# Patient Record
Sex: Female | Born: 1985 | Race: Black or African American | Hispanic: No | Marital: Single | Smoking: Never smoker
Health system: Southern US, Community
[De-identification: ages and names within clinical notes are randomized; demographics above are authoritative.]

## PROBLEM LIST (undated history)

## (undated) DIAGNOSIS — D8940 Mast cell activation, unspecified: Secondary | ICD-10-CM

## (undated) DIAGNOSIS — L509 Urticaria, unspecified: Secondary | ICD-10-CM

## (undated) DIAGNOSIS — Q796 Ehlers-Danlos syndrome, unspecified: Secondary | ICD-10-CM

## (undated) DIAGNOSIS — G90A Postural orthostatic tachycardia syndrome (POTS): Secondary | ICD-10-CM

## (undated) DIAGNOSIS — T7840XA Allergy, unspecified, initial encounter: Secondary | ICD-10-CM

## (undated) DIAGNOSIS — J45909 Unspecified asthma, uncomplicated: Secondary | ICD-10-CM

## (undated) HISTORY — DX: Urticaria, unspecified: L50.9

## (undated) HISTORY — DX: Allergy, unspecified, initial encounter: T78.40XA

## (undated) HISTORY — DX: Unspecified asthma, uncomplicated: J45.909

## (undated) HISTORY — PX: NO PAST SURGERIES: SHX2092

---
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
## 2007-03-28 ENCOUNTER — Other Ambulatory Visit: Admission: RE | Admit: 2007-03-28 | Discharge: 2007-03-28 | Payer: Self-pay | Admitting: Gynecology

## 2007-09-04 ENCOUNTER — Other Ambulatory Visit: Admission: RE | Admit: 2007-09-04 | Discharge: 2007-09-04 | Payer: Self-pay | Admitting: Gynecology

## 2011-03-15 DIAGNOSIS — Z91013 Allergy to seafood: Secondary | ICD-10-CM | POA: Insufficient documentation

## 2011-03-15 DIAGNOSIS — J45909 Unspecified asthma, uncomplicated: Secondary | ICD-10-CM | POA: Insufficient documentation

## 2011-03-15 DIAGNOSIS — R5381 Other malaise: Secondary | ICD-10-CM | POA: Insufficient documentation

## 2011-03-15 DIAGNOSIS — L500 Allergic urticaria: Secondary | ICD-10-CM | POA: Insufficient documentation

## 2011-03-15 HISTORY — DX: Allergy to seafood: Z91.013

## 2013-12-02 ENCOUNTER — Ambulatory Visit (INDEPENDENT_AMBULATORY_CARE_PROVIDER_SITE_OTHER): Payer: BC Managed Care – PPO | Admitting: Emergency Medicine

## 2013-12-02 VITALS — BP 118/76 | HR 68 | Temp 98.2°F | Resp 16 | Ht 71.0 in | Wt <= 1120 oz

## 2013-12-02 DIAGNOSIS — R5383 Other fatigue: Secondary | ICD-10-CM

## 2013-12-02 LAB — POCT CBC
Granulocyte percent: 59.7 %G (ref 37–80)
HCT, POC: 40.5 % (ref 37.7–47.9)
Hemoglobin: 13.1 g/dL (ref 12.2–16.2)
Lymph, poc: 1.9 (ref 0.6–3.4)
MCH, POC: 30.3 pg (ref 27–31.2)
MCHC: 32.4 g/dL (ref 31.8–35.4)
MCV: 93.3 fL (ref 80–97)
MID (cbc): 0.3 (ref 0–0.9)
MPV: 7.8 fL (ref 0–99.8)
POC GRANULOCYTE: 3.2 (ref 2–6.9)
POC LYMPH PERCENT: 34.4 %L (ref 10–50)
POC MID %: 5.9 % (ref 0–12)
Platelet Count, POC: 180 10*3/uL (ref 142–424)
RBC: 4.34 M/uL (ref 4.04–5.48)
RDW, POC: 14 %
WBC: 5.4 10*3/uL (ref 4.6–10.2)

## 2013-12-02 LAB — POCT UA - MICROSCOPIC ONLY
BACTERIA, U MICROSCOPIC: NEGATIVE
Casts, Ur, LPF, POC: NEGATIVE
Crystals, Ur, HPF, POC: NEGATIVE
MUCUS UA: NEGATIVE
WBC, UR, HPF, POC: NEGATIVE
Yeast, UA: NEGATIVE

## 2013-12-02 LAB — POCT URINALYSIS DIPSTICK
BILIRUBIN UA: NEGATIVE
GLUCOSE UA: NEGATIVE
Ketones, UA: NEGATIVE
Leukocytes, UA: NEGATIVE
Nitrite, UA: NEGATIVE
Protein, UA: NEGATIVE
SPEC GRAV UA: 1.01
Urobilinogen, UA: 0.2
pH, UA: 7

## 2013-12-02 LAB — COMPREHENSIVE METABOLIC PANEL
ALBUMIN: 4.3 g/dL (ref 3.5–5.2)
ALT: 10 U/L (ref 0–35)
AST: 20 U/L (ref 0–37)
Alkaline Phosphatase: 51 U/L (ref 39–117)
BUN: 11 mg/dL (ref 6–23)
CO2: 27 meq/L (ref 19–32)
Calcium: 9.5 mg/dL (ref 8.4–10.5)
Chloride: 103 mEq/L (ref 96–112)
Creat: 0.73 mg/dL (ref 0.50–1.10)
GLUCOSE: 80 mg/dL (ref 70–99)
POTASSIUM: 3.9 meq/L (ref 3.5–5.3)
SODIUM: 138 meq/L (ref 135–145)
Total Bilirubin: 0.7 mg/dL (ref 0.2–1.2)
Total Protein: 7.6 g/dL (ref 6.0–8.3)

## 2013-12-02 LAB — TSH: TSH: 1.117 u[IU]/mL (ref 0.350–4.500)

## 2013-12-02 LAB — POCT SEDIMENTATION RATE: POCT SED RATE: 58 mm/h — AB (ref 0–22)

## 2013-12-02 NOTE — Patient Instructions (Signed)
Fatigue Fatigue is a feeling of tiredness, lack of energy, lack of motivation, or feeling tired all the time. Having enough rest, good nutrition, and reducing stress will normally reduce fatigue. Consult your caregiver if it persists. The nature of your fatigue will help your caregiver to find out its cause. The treatment is based on the cause.  CAUSES  There are many causes for fatigue. Most of the time, fatigue can be traced to one or more of your habits or routines. Most causes fit into one or more of three general areas. They are: Lifestyle problems  Sleep disturbances.  Overwork.  Physical exertion.  Unhealthy habits.  Poor eating habits or eating disorders.  Alcohol and/or drug use .  Lack of proper nutrition (malnutrition). Psychological problems  Stress and/or anxiety problems.  Depression.  Grief.  Boredom. Medical Problems or Conditions  Anemia.  Pregnancy.  Thyroid gland problems.  Recovery from major surgery.  Continuous pain.  Emphysema or asthma that is not well controlled  Allergic conditions.  Diabetes.  Infections (such as mononucleosis).  Obesity.  Sleep disorders, such as sleep apnea.  Heart failure or other heart-related problems.  Cancer.  Kidney disease.  Liver disease.  Effects of certain medicines such as antihistamines, cough and cold remedies, prescription pain medicines, heart and blood pressure medicines, drugs used for treatment of cancer, and some antidepressants. SYMPTOMS  The symptoms of fatigue include:   Lack of energy.  Lack of drive (motivation).  Drowsiness.  Feeling of indifference to the surroundings. DIAGNOSIS  The details of how you feel help guide your caregiver in finding out what is causing the fatigue. You will be asked about your present and past health condition. It is important to review all medicines that you take, including prescription and non-prescription items. A thorough exam will be done.  You will be questioned about your feelings, habits, and normal lifestyle. Your caregiver may suggest blood tests, urine tests, or other tests to look for common medical causes of fatigue.  TREATMENT  Fatigue is treated by correcting the underlying cause. For example, if you have continuous pain or depression, treating these causes will improve how you feel. Similarly, adjusting the dose of certain medicines will help in reducing fatigue.  HOME CARE INSTRUCTIONS   Try to get the required amount of good sleep every night.  Eat a healthy and nutritious diet, and drink enough water throughout the day.  Practice ways of relaxing (including yoga or meditation).  Exercise regularly.  Make plans to change situations that cause stress. Act on those plans so that stresses decrease over time. Keep your work and personal routine reasonable.  Avoid street drugs and minimize use of alcohol.  Start taking a daily multivitamin after consulting your caregiver. SEEK MEDICAL CARE IF:   You have persistent tiredness, which cannot be accounted for.  You have fever.  You have unintentional weight loss.  You have headaches.  You have disturbed sleep throughout the night.  You are feeling sad.  You have constipation.  You have dry skin.  You have gained weight.  You are taking any new or different medicines that you suspect are causing fatigue.  You are unable to sleep at night.  You develop any unusual swelling of your legs or other parts of your body. SEEK IMMEDIATE MEDICAL CARE IF:   You are feeling confused.  Your vision is blurred.  You feel faint or pass out.  You develop severe headache.  You develop severe abdominal, pelvic, or   back pain.  You develop chest pain, shortness of breath, or an irregular or fast heartbeat.  You are unable to pass a normal amount of urine.  You develop abnormal bleeding such as bleeding from the rectum or you vomit blood.  You have thoughts  about harming yourself or committing suicide.  You are worried that you might harm someone else. MAKE SURE YOU:   Understand these instructions.  Will watch your condition.  Will get help right away if you are not doing well or get worse. Document Released: 11/07/2006 Document Revised: 04/04/2011 Document Reviewed: 05/14/2013 [facility] Patient Information 2015 [place], [place]. This information is not intended to replace advice given to you by your health care provider. Make sure you discuss any questions you have with your health care provider. Benign Positional Vertigo Vertigo means you feel like you or your surroundings are moving when they are not. Benign positional vertigo is the most common form of vertigo. Benign means that the cause of your condition is not serious. Benign positional vertigo is more common in older adults. CAUSES  Benign positional vertigo is the result of an upset in the labyrinth system. This is an area in the middle ear that helps control your balance. This may be caused by a viral infection, head injury, or repetitive motion. However, often no specific cause is found. SYMPTOMS  Symptoms of benign positional vertigo occur when you move your head or eyes in different directions. Some of the symptoms may include:  Loss of balance and falls.  Vomiting.  Blurred vision.  Dizziness.  Nausea.  Involuntary eye movements (nystagmus). DIAGNOSIS  Benign positional vertigo is usually diagnosed by physical exam. If the specific cause of your benign positional vertigo is unknown, your caregiver may perform imaging tests, such as magnetic resonance imaging (MRI) or computed tomography (CT). TREATMENT  Your caregiver may recommend movements or procedures to correct the benign positional vertigo. Medicines such as meclizine, benzodiazepines, and medicines for nausea may be used to treat your symptoms. In rare cases, if your symptoms are caused by certain conditions that  affect the inner ear, you may need surgery. HOME CARE INSTRUCTIONS   Follow your caregiver's instructions.  Move slowly. Do not make sudden body or head movements.  Avoid driving.  Avoid operating heavy machinery.  Avoid performing any tasks that would be dangerous to you or others during a vertigo episode.  Drink enough fluids to keep your urine clear or pale yellow. SEEK IMMEDIATE MEDICAL CARE IF:   You develop problems with walking, weakness, numbness, or using your arms, hands, or legs.  You have difficulty speaking.  You develop severe headaches.  Your nausea or vomiting continues or gets worse.  You develop visual changes.  Your family or friends notice any behavioral changes.  Your condition gets worse.  You have a fever.  You develop a stiff neck or sensitivity to light. MAKE SURE YOU:   Understand these instructions.  Will watch your condition.  Will get help right away if you are not doing well or get worse. Document Released: 10/18/2005 Document Revised: 04/04/2011 Document Reviewed: 09/30/2010 [facility] Patient Information 2015 [place], [place]. This information is not intended to replace advice given to you by your health care provider. Make sure you discuss any questions you have with your health care provider.

## 2013-12-02 NOTE — Progress Notes (Addendum)
[facility] [address], [place] [address] [phone]  Date:  12/02/2013   Name:  Theresa Cruz   DOB:  [birth year]   MRN:  [id number]  PCP:  No primary care provider on file.    Chief Complaint: Dizziness; Nausea; and Light-headed   History of Present Illness:  Theresa Cruz is a 28 y.o. very pleasant female patient who presents with the following:  History of recurrent episodes of benign positional vertigo over past.  Resumed again last week on Wednesday. No history of head injury or antecedent illness Missed rest of week work. Dizzy again on Sunday.  Still light headed. Worse with position change Says feels month long history of fatigue and feeling overly tired.  Gets same amount of sleep as usual 7-8 hours No interruptions to sleep. No cough, coryza, fever or chills.  No nausea or vomiting.  No change in diet or appetite.  No stool change Not depressed. Works going well.  Family support. No relationship. No improvement with over the counter medications or other home remedies. Denies other complaint or health concern today.   There are no active problems to display for this patient.   Past Medical History  Diagnosis Date  . Allergy     History reviewed. No pertinent past surgical history.  History  Substance Use Topics  . Smoking status: Never Smoker   . Smokeless tobacco: Not on file  . Alcohol Use: No    Family History  Problem Relation Age of Onset  . Cancer Father   . Cancer Maternal Grandfather     Allergies  Allergen Reactions  . Peanuts [Peanut Oil]     Medication list has been reviewed and updated.  No current outpatient prescriptions on file prior to visit.   No current facility-administered medications on file prior to visit.    Review of Systems:  As per HPI, otherwise negative.    Physical Examination: Filed Vitals:   12/02/13 0942  BP: 118/76  Pulse: 68  Temp: 98.2 F (36.8 C)  Resp: 16    Filed Vitals:   12/02/13 0942  Height: 5\' 11"  (1.803 m)  Weight: 21 lb 12.8 oz (9.888 kg)   Body mass index is 3.04 kg/(m^2). Ideal Body Weight: Weight in (lb) to have BMI = 25: 178.9  GEN: WDWN, NAD, Non-toxic, A & O x 3 HEENT: Atraumatic, Normocephalic. Neck supple. No masses, No LAD. Ears and Nose: No external deformity. CV: RRR, No M/G/R. No JVD. No thrill. No extra heart sounds. PULM: CTA B, no wheezes, crackles, rhonchi. No retractions. No resp. distress. No accessory muscle use. ABD: S, NT, ND, +BS. No rebound. No HSM. EXTR: No c/c/e NEURO Normal gait. Romberg and tandem gait normal.  Cn 2-12 intact.  PRRERLA EOMI PSYCH: Normally interactive. Conversant. Not depressed or anxious appearing.  Calm demeanor.    Assessment and Plan: Benign paroxysmal vertigo ENT antivert Fatigue labs Signed,  Ellison Carwin, MD   Results for orders placed or performed in visit on 12/02/13  Comprehensive metabolic panel  Result Value Ref Range   Sodium 138 135 - 145 mEq/L   Potassium 3.9 3.5 - 5.3 mEq/L   Chloride 103 96 - 112 mEq/L   CO2 27 19 - 32 mEq/L   Glucose, Bld 80 70 - 99 mg/dL   BUN 11 6 - 23 mg/dL   Creat 0.73 0.50 - 1.10 mg/dL   Total Bilirubin 0.7 0.2 - 1.2 mg/dL   Alkaline Phosphatase 51  39 - 117 U/L   AST 20 0 - 37 U/L   ALT 10 0 - 35 U/L   Total Protein 7.6 6.0 - 8.3 g/dL   Albumin 4.3 3.5 - 5.2 g/dL   Calcium 9.5 8.4 - 10.5 mg/dL  TSH  Result Value Ref Range   TSH 1.117 0.350 - 4.500 uIU/mL  Vit D  25 hydroxy (rtn osteoporosis monitoring)  Result Value Ref Range   Vit D, 25-Hydroxy  30 - 89 ng/mL  POCT CBC  Result Value Ref Range   WBC 5.4 4.6 - 10.2 K/uL   Lymph, poc 1.9 0.6 - 3.4   POC LYMPH PERCENT 34.4 10 - 50 %L   MID (cbc) 0.3 0 - 0.9   POC MID % 5.9 0 - 12 %M   POC Granulocyte 3.2 2 - 6.9   Granulocyte percent 59.7 37 - 80 %G   RBC 4.34 4.04 - 5.48 M/uL   Hemoglobin 13.1 12.2 - 16.2 g/dL   HCT, POC 40.5 37.7 - 47.9 %   MCV 93.3 80 - 97 fL    MCH, POC 30.3 27 - 31.2 pg   MCHC 32.4 31.8 - 35.4 g/dL   RDW, POC 14.0 %   Platelet Count, POC 180 142 - 424 K/uL   MPV 7.8 0 - 99.8 fL  POCT UA - Microscopic Only  Result Value Ref Range   WBC, Ur, HPF, POC neg    RBC, urine, microscopic 0-1    Bacteria, U Microscopic neg    Mucus, UA neg    Epithelial cells, urine per micros 2-5    Crystals, Ur, HPF, POC neg    Casts, Ur, LPF, POC neg    Yeast, UA neg   POCT urinalysis dipstick  Result Value Ref Range   Color, UA yellow    Clarity, UA clear    Glucose, UA neg    Bilirubin, UA neg    Ketones, UA neg    Spec Grav, UA 1.010    Blood, UA large    pH, UA 7.0    Protein, UA neg    Urobilinogen, UA 0.2    Nitrite, UA neg    Leukocytes, UA Negative   POCT SEDIMENTATION RATE  Result Value Ref Range   POCT SED RATE 58 (A) 0 - 22 mm/hr

## 2013-12-03 LAB — VITAMIN D 25 HYDROXY (VIT D DEFICIENCY, FRACTURES): VIT D 25 HYDROXY: 30 ng/mL (ref 30–89)

## 2015-11-12 ENCOUNTER — Ambulatory Visit (INDEPENDENT_AMBULATORY_CARE_PROVIDER_SITE_OTHER): Payer: Self-pay | Admitting: Family Medicine

## 2015-11-12 ENCOUNTER — Encounter: Payer: Self-pay | Admitting: Family Medicine

## 2015-11-12 VITALS — BP 121/81 | HR 75 | Temp 98.6°F | Resp 16 | Ht 63.0 in | Wt 120.0 lb

## 2015-11-12 DIAGNOSIS — Z7689 Persons encountering health services in other specified circumstances: Secondary | ICD-10-CM

## 2015-11-12 DIAGNOSIS — Z0000 Encounter for general adult medical examination without abnormal findings: Secondary | ICD-10-CM

## 2015-11-12 DIAGNOSIS — J45909 Unspecified asthma, uncomplicated: Secondary | ICD-10-CM

## 2015-11-12 LAB — CBC WITH DIFFERENTIAL/PLATELET
BASOS ABS: 0 {cells}/uL (ref 0–200)
Basophils Relative: 0 %
EOS ABS: 71 {cells}/uL (ref 15–500)
Eosinophils Relative: 1 %
HCT: 36.2 % (ref 35.0–45.0)
Hemoglobin: 11.6 g/dL — ABNORMAL LOW (ref 11.7–15.5)
LYMPHS ABS: 1562 {cells}/uL (ref 850–3900)
Lymphocytes Relative: 22 %
MCH: 29.7 pg (ref 27.0–33.0)
MCHC: 32 g/dL (ref 32.0–36.0)
MCV: 92.6 fL (ref 80.0–100.0)
MONO ABS: 497 {cells}/uL (ref 200–950)
MPV: 12.1 fL (ref 7.5–12.5)
Monocytes Relative: 7 %
NEUTROS ABS: 4970 {cells}/uL (ref 1500–7800)
Neutrophils Relative %: 70 %
Platelets: 182 10*3/uL (ref 140–400)
RBC: 3.91 MIL/uL (ref 3.80–5.10)
RDW: 14.7 % (ref 11.0–15.0)
WBC: 7.1 10*3/uL (ref 3.8–10.8)

## 2015-11-12 LAB — COMPLETE METABOLIC PANEL WITH GFR
ALT: 10 U/L (ref 6–29)
AST: 19 U/L (ref 10–30)
Albumin: 3.9 g/dL (ref 3.6–5.1)
Alkaline Phosphatase: 45 U/L (ref 33–115)
BUN: 19 mg/dL (ref 7–25)
CHLORIDE: 106 mmol/L (ref 98–110)
CO2: 23 mmol/L (ref 20–31)
Calcium: 8.9 mg/dL (ref 8.6–10.2)
Creat: 0.66 mg/dL (ref 0.50–1.10)
GFR, Est African American: >89 mL/min (ref >=60)
Glucose, Bld: 104 mg/dL — ABNORMAL HIGH (ref 65–99)
POTASSIUM: 3.6 mmol/L (ref 3.5–5.3)
SODIUM: 138 mmol/L (ref 135–146)
Total Bilirubin: 0.5 mg/dL (ref 0.2–1.2)
Total Protein: 7 g/dL (ref 6.1–8.1)

## 2015-11-12 MED ORDER — CETIRIZINE HCL 10 MG PO TABS: 10.0000 mg | ORAL_TABLET | Freq: Every day | ORAL | 1 refills | Status: DC

## 2015-11-12 MED ORDER — ALBUTEROL SULFATE HFA 108 (90 BASE) MCG/ACT IN AERS: 2.0000 | INHALATION_SPRAY | Freq: Four times a day (QID) | RESPIRATORY_TRACT | 2 refills | Status: DC | PRN

## 2015-11-12 MED ORDER — FLUTICASONE PROPIONATE 50 MCG/ACT NA SUSP: 2.0000 | Freq: Every day | NASAL | 2 refills | Status: DC

## 2015-11-12 MED ORDER — ALBUTEROL SULFATE HFA 108 (90 BASE) MCG/ACT IN AERS
2.0000 | INHALATION_SPRAY | Freq: Four times a day (QID) | RESPIRATORY_TRACT | 2 refills | Status: DC | PRN
Start: 2015-11-12 — End: 2016-03-04

## 2015-11-13 LAB — HEMOGLOBIN A1C
Hgb A1c MFr Bld: 5.4 % (ref <5.7)
MEAN PLASMA GLUCOSE: 108 mg/dL

## 2015-11-17 NOTE — Patient Instructions (Signed)
Follow up as needed

## 2015-11-17 NOTE — Progress Notes (Signed)
Theresa Cruz, is a 30 y.o. female  [id number]  [id number]  DOB - [birth year]  CC:  Chief Complaint  Patient presents with  . Establish Care    pulling in muscles   . Asthma    flare up   . Menstrual Problem    increase in cramping and lasting long   . Joint Pain    in knees   . Foot Pain    foot pain in arch   . Headache    2 to 3 per month   . Allergies    wants rx for allergies   . Dental Pain    bleeding when brushing. pain in jaw   . Eye Problem    vison exam        HPI: Theresa Cruz is a 30 y.o. female here to establish care. Her  only established diagnoses are allergies and asthma. She  is requesting refills on medications for allergies. She is on albuterol, flonase and zyrtec. She also has a history of MHA. She complains today of irregular periods, joint pain, achy muscles, dental pain. Is want referral to dentist and to eye doctor.She is recently home from [place] for 15 months.   Health Maintenance: She is to receive flu shot today. She will reschedule for a PAP>  Allergies  Allergen Reactions  . Imitrex [Sumatriptan]   . Peanuts [Peanut Oil]   . Topamax [Topiramate]    Past Medical History:  Diagnosis Date  . Allergy    No current outpatient prescriptions on file prior to visit.   No current facility-administered medications on file prior to visit.    Family History  Problem Relation Age of Onset  . Cancer Father   . Cancer Maternal Grandfather    Social History   Social History  . Marital status: Single    Spouse name: N/A  . Number of children: N/A  . Years of education: N/A   Occupational History  . Not on file.   Social History Main Topics  . Smoking status: Never Smoker  . Smokeless tobacco: Never Used  . Alcohol use No  . Drug use: No  . Sexual activity: Not on file   Other Topics Concern  . Not on file   Social History Narrative  . No narrative on file    Review of Systems: Constitutional: + for  fatigue Skin: Negative HENT: + for nasal congestion Eyes: + for decreased vision Neck: Negative Respiratory: + for shortness of breath, coughing a wheezing fairly frequently Cardiovascular: Negative Gastrointestinal: Negative Genitourinary: Negative  Musculoskeletal: + low back pain Neurological: +headaches Hematological: Negative  Psychiatric/Behavioral: Negative    Objective:   Vitals:   11/12/15 1355  BP: 121/81  Pulse: 75  Resp: 16  Temp: 98.6 F (37 C)    Physical Exam: Constitutional: Patient appears well-developed and well-nourished. No distress. HENT: Normocephalic, atraumatic, External right and left ear normal. Oropharynx is clear and moist.  Eyes: Conjunctivae and EOM are normal. PERRLA, no scleral icterus. Neck: Normal ROM. Neck supple. No lymphadenopathy, No thyromegaly. CVS: RRR, S1/S2 +, no murmurs, no gallops, no rubs Pulmonary: Effort and breath sounds normal, no stridor, rhonchi, wheezes, rales.  Abdominal: Soft. Normoactive BS,, no distension, tenderness, rebound or guarding.  Musculoskeletal: Normal range of motion. No edema and no tenderness.  Neuro: Alert.Normal muscle tone coordination. Non-focal Skin: Skin is warm and dry. No rash noted. Not diaphoretic. No erythema. No pallor. Psychiatric: Normal mood and affect. Behavior, judgment, thought content normal.  Lab Results  Component Value Date   WBC 7.1 11/12/2015   HGB 11.6 (L) 11/12/2015   HCT 36.2 11/12/2015   MCV 92.6 11/12/2015   PLT 182 11/12/2015   Lab Results  Component Value Date   CREATININE 0.66 11/12/2015   BUN 19 11/12/2015   NA 138 11/12/2015   K 3.6 11/12/2015   CL 106 11/12/2015   CO2 23 11/12/2015    Lab Results  Component Value Date   HGBA1C 5.4 11/12/2015   Lipid Panel  No results found for: CHOL, TRIG, HDL, CHOLHDL, VLDL, LDLCALC     Assessment and plan:   1. Encounter to establish care -I have reviewed information provided by the patient w/o  interpreter.  2. Healthcare maintenance  - COMPLETE METABOLIC PANEL WITH GFR - CBC with Differential - Hemoglobin A1c - Flu Vaccine QUAD 36+ mos PF IM (Fluarix & Fluzone Quad PF) - Ambulatory referral to Dentistry  3. Asthma due to environmental allergies  - fluticasone (FLONASE) 50 MCG/ACT nasal spray; Place 2 sprays into both nostrils daily.  Dispense: 16 g; Refill: 2 - albuterol (PROVENTIL HFA;VENTOLIN HFA) 108 (90 Base) MCG/ACT inhaler; Inhale 2 puffs into the lungs every 6 (six) hours as needed for wheezing or shortness of breath.  Dispense: 1 Inhaler; Refill: 2 - cetirizine (ZYRTEC) 10 MG tablet; Take 1 tablet (10 mg total) by mouth daily.  Dispense: 90 tablet; Refill: 1   Return in about 6 months (around 05/12/2016).  The patient was given clear instructions to go to ER or return to medical center if symptoms don't improve, worsen or new problems develop. The patient verbalized understanding.    Micheline Chapman FNP  11/17/2015, 10:16 AM

## 2015-12-25 ENCOUNTER — Ambulatory Visit: Payer: No Typology Code available for payment source | Admitting: Family Medicine

## 2016-02-05 ENCOUNTER — Ambulatory Visit: Payer: Self-pay | Admitting: Allergy

## 2016-02-24 ENCOUNTER — Telehealth: Payer: Self-pay | Admitting: Allergy and Immunology

## 2016-02-24 NOTE — Telephone Encounter (Signed)
Patient called and said she is going into the [employer] and she needs a letter stating that her asthma will not interfere with her duties.

## 2016-02-24 NOTE — Telephone Encounter (Signed)
Spoke with patient and advised that she would need to come in for an appointment to re-establish care. She also stated that she would need a complete PFT in addition to the office visit. Patient will call back and schedule appointment.

## 2016-03-04 ENCOUNTER — Ambulatory Visit (INDEPENDENT_AMBULATORY_CARE_PROVIDER_SITE_OTHER): Payer: BLUE CROSS/BLUE SHIELD | Admitting: Allergy

## 2016-03-04 ENCOUNTER — Encounter: Payer: Self-pay | Admitting: Allergy

## 2016-03-04 VITALS — BP 112/70 | HR 94 | Temp 97.9°F | Resp 20 | Ht 63.0 in | Wt 112.0 lb

## 2016-03-04 DIAGNOSIS — Z8709 Personal history of other diseases of the respiratory system: Secondary | ICD-10-CM | POA: Diagnosis not present

## 2016-03-04 DIAGNOSIS — Z91018 Allergy to other foods: Secondary | ICD-10-CM

## 2016-03-04 DIAGNOSIS — J301 Allergic rhinitis due to pollen: Secondary | ICD-10-CM | POA: Diagnosis not present

## 2016-03-04 MED ORDER — EPINEPHRINE 0.3 MG/0.3ML IJ SOAJ
0.3000 mg | Freq: Once | INTRAMUSCULAR | 0 refills | Status: AC
Start: 2016-03-04 — End: 2016-03-04

## 2016-03-04 NOTE — Progress Notes (Signed)
New Patient Note  RE: Theresa Cruz MRN: [id number] DOB: 22-Sep-1985 Date of Office Visit: 03/04/2016  Referring provider: Micheline Chapman, NP Primary care provider: Sharon Seller, NP  Chief Complaint: Reassess asthma and food allergy  History of present illness: Theresa Cruz is a 31 y.o. female presenting today for consultation for asthma and food allergy.  She is a former patient of ours with her last visit being in November 2015. She was previously seeing [place] for issues with allergic rhinitis, asthma and food allergy..   She is currently in process of being commissioned into [employer].  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   She was 31yo when she was diagnosed with asthma, peanut allergy and migraines.  She reports she has never required breathing treatments.  She has only had rescue albuterol only (no ICS management).  She reports never required oral steroids, ED visits or hospitalization related to asthma.  She reports she was very active in sports in high school and would use prior to activity.  She feels her last albuterol use in around 2014-2015 year.     With pollen exposure and fresh cut grass she reports headache.  With dust exposure she reports nasal congestion.  Occasional sneezing with pollen exposure. No ocular symptoms.  She will take Loratadine and flonase as needed which helps. She was last allergy tested via skin prick on her 2015 and was positive for grasses, trees, molds, dog and equivocal to cat and cockroach..   She reports being allergic to peanuts (she avoids tree nuts as well), fish, shellfish.   With peanut ingestion on 2 occasions she recalls scratchy throat, trouble swallowing with initial reaction in 2006.  She reports she is sensitive to the smell of peanut.  With seafood she reports she has never had a reaction from ingestion of seafood.  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  She had testing 2009 for seafood which was positive.  She does not have an up-to-date epipen.  She meticulously no weights these foods and has not had a axilla ingestion or reaction.   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   She would eat fresh blueberry in [place] and she would feel bloated and have GI discomfort.  With peach ingestion she also felt GI discomfort and some throat itch.  With coconut oil/lotion she reports the smell of it will make her throat feel scratchy and GI discomfort.  She does put coconut water in her smoothies without any issues.   She had a food sensitivity IgG test 2 weeks ago that positive results to a lot of foods she commonly eats.  She is currently on a gluten free, dairy free and only eating [place] for meats..    She lived in [place] for 14 months and returned to the [place] in May 2017.    Review of systems: Review of Systems  Constitutional: Negative for chills, fever and malaise/fatigue.  HENT: Negative for congestion, ear discharge, nosebleeds, sinus pain and sore throat.   Eyes: Negative for discharge and redness.  Respiratory: Negative for cough, shortness of breath and wheezing.   Cardiovascular: Negative for chest pain.  Gastrointestinal: Negative for abdominal pain, heartburn, nausea and vomiting.  Skin: Negative for itching and rash.  Psychiatric/Behavioral: Negative for depression.    All other systems negative unless noted above in HPI  Past medical history: Past Medical History:  Diagnosis Date  . Allergy   . Asthma   . Urticaria     Past surgical history: History reviewed. No pertinent surgical history.  Family history:  Family History  Problem Relation Age of Onset  . Cancer Father   . Cancer Maternal Grandfather   . Allergic rhinitis Neg Hx   . Angioedema Neg Hx   . Asthma Neg Hx   . Eczema Neg Hx   . Immunodeficiency Neg Hx   . Urticaria Neg Hx     Social history: She lives in a  home with carpeting with electric heating and central cooling. There are no concerns for water damage or mildew or roaches in the home there are no pets in the home. 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 Always use your most recent med list.          cetirizine 10 MG tablet Commonly known as:  ZYRTEC Take 1 tablet (10 mg total) by mouth daily.   fluticasone 50 MCG/ACT nasal spray Commonly known as:  FLONASE Place 2 sprays into both nostrils daily.       Known medication allergies: Allergies  Allergen Reactions  . Fish-Derived Products Hives  . Other Hives    Tree nuts  . Imitrex [Sumatriptan]   . Peanuts [Peanut Oil] Hives  . Rizatriptan     Other reaction(s): Other (See Comments) Muscles tightening  . Sumatriptan Succinate     Other reaction(s): Other (See Comments) Does not remember  . Topamax [Topiramate]      Physical examination: Blood pressure 112/70, pulse 94, temperature 97.9 F (36.6 C), temperature source Oral, resp. rate 20, height 5\' 3"  (1.6 m), weight 112 lb (50.8 kg), SpO2 98 %.  General: Alert, interactive, in no acute distress. HEENT: TMs pearly gray, turbinates non-edematous without discharge, post-pharynx non erythematous. Neck: Supple without lymphadenopathy. Lungs: Clear to auscultation without wheezing, rhonchi or rales. {no increased work of breathing. CV: Normal S1, S2 without murmurs. Abdomen: Nondistended, nontender. Skin: Warm and dry, without lesions or rashes. Extremities:  No clubbing,  cyanosis or edema. Neuro:   Grossly intact.  Diagnositics/Labs: Spirometry: FEV1: 2.93L  116%, FVC: 3.53L  121%, ratio consistent with Nonobstructive pattern. Spirometry is normal.  Assessment and plan:   Food allergy    - will obtain serum IgE levels for various foodsIncluding nuts panel and shellfish and fish panel dairy and several spices    - It appears she has some component of food intolerance as well and discussed the role of the IgG food testing she had in terms of possible intolerance. IgG levels do not indicate possible life-threatening reactions is in regards to IgE mediated food allergy. Patient is aware of the difference between these 2 tests.    - have access to epipen 0.3mg  at all times.    Allergic rhinitis    - Sensitized to grasses, trees, mold, dog on previous testing.     -  Allergen avoidance measures     - use Claritin 10mg  and Flonase 1-2 sprays each nostril as needed for allergy symptoms control  Previous history of asthma     - at this time no sign of persistent asthma symptom      - spirometry (assess lung function is normal)     - do not see a need for albuterol use at this time or any controller medication.    Follow-up 1 year or sooner if needed   I appreciate the opportunity to take part in Austina's care. Please do not hesitate to contact me with questions.  Sincerely,   Prudy Feeler, MD Allergy/Immunology [facility]

## 2016-03-04 NOTE — Patient Instructions (Addendum)
Food allergy    - will obtain serum IgE levels for various foods    - have access to epipen 0.3mg  at all times.    Allergic rhinitis    - you have sensitivity to grasses, trees, mold, dog on previous testing.      - use Claritin 10mg  as needed for allergy symptoms control  Previous history of asthma     - at this time no sign of persistent asthma symptoms.       - spirometry (assess lung function is normal)     - do not see a need for albuterol use at this time.    Follow-up 1 year or sooner if needed

## 2016-03-09 ENCOUNTER — Other Ambulatory Visit: Payer: Self-pay | Admitting: Allergy

## 2016-03-09 ENCOUNTER — Telehealth: Payer: Self-pay

## 2016-03-09 NOTE — Telephone Encounter (Signed)
-----   Message from [place], MD sent at 03/09/2016  8:51 AM EST ----- Regarding: pt letter Please call pt and let her know her letter is ready for either pick-up or mail.      Thanks, Dr Mamie Nick

## 2016-03-09 NOTE — Telephone Encounter (Signed)
Clld pt - [place] re whether she will pick letter up or would like it mailed.

## 2016-03-13 LAB — ALLERGY PANEL 18, NUT MIX GROUP
Almonds: 0.1 kU/L — ABNORMAL HIGH
Cashew IgE: 2.3 kU/L — ABNORMAL HIGH
HAZELNUT: 0.37 kU/L — AB
Peanut IgE: 0.12 kU/L — ABNORMAL HIGH
Pecan Nut: 0.42 kU/L — ABNORMAL HIGH
SESAME SEED IGE: 0.14 kU/L — AB

## 2016-03-13 LAB — ALLERGEN, NUTMEG, RF282: Allergen, Nutmeg, Rf282: <0.1 kU/L

## 2016-03-13 LAB — ALLERGEN, BLUEBERRY, RF288

## 2016-03-13 LAB — ALLERGEN, BRAZIL NUT, F18: Brazil Nut: <0.1 kU/L

## 2016-03-13 LAB — CP658 FISH PANEL
Allergen, Flounder, Rf337: <0.1 kU/L
Allergen,Halibut,Rf303: <0.1 kU/L

## 2016-03-13 LAB — ALLERGEN MILK: MILK IGE: 1.34 kU/L — AB

## 2016-03-13 LAB — CP570 SHELLFISH PANEL
Lobster: <0.1 kU/L
Scallop IgE: 0.12 kU/L — ABNORMAL HIGH
Shrimp IgE: 0.12 kU/L — ABNORMAL HIGH

## 2016-03-13 LAB — SACCHAROMYCES CEREVISIAE ANTIBODIES, IGG AND IGA
SACCHAROMYCES CEREVISIAE IGG: 7.1 U (ref <=20.0)
SACCHAROMYCES CEREVISIAE, IGA: 7.6 U (ref <=20.0)

## 2016-03-13 LAB — ALLERGEN, CASEIN, F78: Allergen, Casein, f78: 0.13 kU/L — ABNORMAL HIGH

## 2016-03-13 LAB — ALLERGEN WALNUT F256: WALNUT: 1.22 kU/L — AB

## 2016-03-13 LAB — ALLERGEN, STRAWBERRY, F44: ALLERGEN, STRAWBERRY, F44: 0.12 kU/L — AB

## 2016-03-13 LAB — ALLERGEN PEACH F95: Allergen, Peach f95: 0.23 kU/L — ABNORMAL HIGH

## 2016-03-15 LAB — ALLERGEN, CACAO (CHOCOLATE)IGG: CACAO (CHOCOLATE)IGG: 2.1 ug/mL — AB (ref <2.0)

## 2016-03-25 ENCOUNTER — Ambulatory Visit: Payer: Self-pay | Admitting: Allergy

## 2016-04-05 ENCOUNTER — Telehealth: Payer: Self-pay | Admitting: Allergy

## 2016-04-05 NOTE — Telephone Encounter (Signed)
Patient said someone called her today, 04-05-16. I looked in her chart and could not tell who called her. She said she had blood work done a couple of weeks ago and it is probably her results.

## 2016-04-06 NOTE — Telephone Encounter (Signed)
Informed patient of results

## 2016-04-27 ENCOUNTER — Telehealth: Payer: Self-pay | Admitting: Allergy

## 2016-04-27 NOTE — Telephone Encounter (Signed)
Please advise 

## 2016-04-27 NOTE — Telephone Encounter (Signed)
patient it calling needing a letter to enroll in the [employer]. The letter must contain that her seafood allergy is currently negative since it has changed from her testing in either 2015 or 2016. Also add the peanut allergy reaction is hives or rash.  Please call pt at [phone] to answer any questions

## 2016-04-28 NOTE — Telephone Encounter (Signed)
Please let pt know that her shellfish panel is not negative; she does still have very low level sensitization to shellfish.  Her fish panel is negative.   However having a negative panel does not clear her of the allergy.   Per my review of her results from February it was recommended that she have skin testing done and she would need to undergo food challenges to be able to clear of her food allergy history in order to provide a letter stating she no longer have seafood allergy.

## 2016-05-02 NOTE — Telephone Encounter (Signed)
Informed patient that we are unable to write letter stating she no longer has a fish allergy. Patient would like you to write a letter stating what food allergies she has and her side effects. Please advise.

## 2016-05-04 NOTE — Telephone Encounter (Signed)
Can you construct a letter with the following:  "Patient name" has several listed food allergies.  She had food allergy testing via serum IgE levels from February 2018.  This testing showed detectable and elevated IgE levels to shellfish (shrimp and scallop), nuts (peanut, almonds, cashew, hazelnut, pecan, walnut), sesame seed, strawberry, peach and milk.  Positive serum IgE levels may indicate clinical food allergy based on history of ingestion of these foods followed by reaction.  She has reported having allergic-type reactions following ingestion and/or inhalation of fish, shellfish, nuts and peach.   At this time she should continue avoidance of shellfish, fish, peanut and tree nuts as well as peach.    If she develops signs or symptoms following strawberry or dairy ingestion these too should be avoided.   Signs or symptoms of allergic reaction are listed as follows: "please list symptoms as outlined on emergency action plan".    ---------------------------------------------------  Please let pt know that in order to determine if she is no longer allergic to these foods (as most of her IgEs are rather low level sensitivity) she would need to have skin testing done and then oral challenges.

## 2016-05-05 NOTE — Telephone Encounter (Signed)
Patient picked up letter.

## 2016-05-19 ENCOUNTER — Encounter: Payer: Self-pay | Admitting: Allergy and Immunology

## 2016-05-19 ENCOUNTER — Ambulatory Visit (INDEPENDENT_AMBULATORY_CARE_PROVIDER_SITE_OTHER): Payer: BLUE CROSS/BLUE SHIELD | Admitting: Allergy and Immunology

## 2016-05-19 VITALS — BP 118/70 | HR 71 | Temp 98.1°F | Resp 16

## 2016-05-19 DIAGNOSIS — H1013 Acute atopic conjunctivitis, bilateral: Secondary | ICD-10-CM | POA: Diagnosis not present

## 2016-05-19 DIAGNOSIS — J3089 Other allergic rhinitis: Secondary | ICD-10-CM | POA: Diagnosis not present

## 2016-05-19 DIAGNOSIS — T7800XD Anaphylactic reaction due to unspecified food, subsequent encounter: Secondary | ICD-10-CM

## 2016-05-19 DIAGNOSIS — H1010 Acute atopic conjunctivitis, unspecified eye: Secondary | ICD-10-CM | POA: Insufficient documentation

## 2016-05-19 DIAGNOSIS — T7800XA Anaphylactic reaction due to unspecified food, initial encounter: Secondary | ICD-10-CM | POA: Insufficient documentation

## 2016-05-19 HISTORY — DX: Acute atopic conjunctivitis, unspecified eye: H10.10

## 2016-05-19 HISTORY — DX: Other allergic rhinitis: J30.89

## 2016-05-19 MED ORDER — OLOPATADINE HCL 0.2 % OP SOLN: 1.0000 [drp] | OPHTHALMIC | 5 refills | Status: DC

## 2016-05-19 MED ORDER — EPINEPHRINE 0.3 MG/0.3ML IJ SOAJ: 0.3000 mg | Freq: Once | INTRAMUSCULAR | 1 refills | Status: AC

## 2016-05-19 MED ORDER — LEVOCETIRIZINE DIHYDROCHLORIDE 5 MG PO TABS: 5.0000 mg | ORAL_TABLET | Freq: Every evening | ORAL | 5 refills | Status: DC

## 2016-05-19 NOTE — Patient Instructions (Addendum)
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  Skin tests peanut was negative today, however given her report of symptoms with even the smell of peanuts, she will avoid this food as well.  Skin test results to fish and shellfish were negative.  Recent blood test revealed borderline positive results to shellfish and negative results to fish panel, therefore an open graded oral challenge to fish will be considered.  She has symptoms with the consumption of grapes and chocolate and had positive skin tests of both of these foods, therefore she will avoid these foods as well.  Skin tests were borderline positive to cow's milk and tomato, however she states that she consumes these foods without systemic symptoms, therefore they most likely represent false positive results.  Continue avoidance of nuts, grapes, and chocolate and have access to epinephrine autoinjector in case of accidental ingestion followed by systemic symptoms.  Consider open graded oral challenge to fish and, on a separate occasion, shellfish.  Until these allergies have been definitively ruled out, she will continue avoidance of all seafood.  Perennial and seasonal allergic rhinitis  Aeroallergen avoidance measures have been discussed and provided in written form.  A prescription has been provided for levocetirizine, 5mg  daily as needed.  Continue fluticasone nasal spray, 2 sprays per nostril daily as needed.  I have also recommended nasal saline spray (i.e., Simply Saline) or nasal saline lavage (i.e., NeilMed) as needed and prior to medicated nasal sprays. The risks and benefits of aeroallergen immunotherapy have been discussed. The patient is motivated to initiate immunotherapy if insurance coverage is favorable. She will let us know how she would like to proceed.  Allergic conjunctivitis  Treatment plan as outlined above for allergic rhinitis.  A prescription has been provided for  Pazeo, one drop per eye daily as needed.  I have also recommended eye lubricant drops (i.e., Natural Tears) as needed.   Return for oral challenge to fish.  Reducing Pollen Exposure  The American Academy of Allergy, Asthma and Immunology suggests the following steps to reduce your exposure to pollen during allergy seasons.    1. Do not hang sheets or clothing out to dry; pollen may collect on these items. 2. Do not mow lawns or spend time around freshly cut grass; mowing stirs up pollen. 3. Keep windows closed at night.  Keep car windows closed while driving. 4. Minimize morning activities outdoors, a time when pollen counts are usually at their highest. 5. Stay indoors as much as possible when pollen counts or humidity is high and on windy days when pollen tends to remain in the air longer. 6. Use air conditioning when possible.  Many air conditioners have filters that trap the pollen spores. 7. Use a HEPA room air filter to remove pollen form the indoor air you breathe.   Control of House Dust Mite Allergen  House dust mites play a major role in allergic asthma and rhinitis.  They occur in environments with high humidity wherever human skin, the food for dust mites is found. High levels have been detected in dust obtained from mattresses, pillows, carpets, upholstered furniture, bed covers, clothes and soft toys.  The principal allergen of the house dust mite is found in its feces.  A gram of dust may contain 1,000 mites and 250,000 fecal particles.  Mite antigen is easily measured in the air during house cleaning activities.    1. Encase mattresses, including the box spring, and pillow,  in an air tight cover.  Seal the zipper end of the encased mattresses with wide adhesive tape. 2. Wash the bedding in water of 130 degrees Farenheit weekly.  Avoid cotton comforters/quilts and flannel bedding: the most ideal bed covering is the dacron comforter. 3. Remove all upholstered furniture from  the bedroom. 4. Remove carpets, carpet padding, rugs, and non-washable window drapes from the bedroom.  Wash drapes weekly or use plastic window coverings. 5. Remove all non-washable stuffed toys from the bedroom.  Wash stuffed toys weekly. 6. Have the room cleaned frequently with a vacuum cleaner and a damp dust-mop.  The patient should not be in a room which is being cleaned and should wait 1 hour after cleaning before going into the room. 7. Close and seal all heating outlets in the bedroom.  Otherwise, the room will become filled with dust-laden air.  An electric heater can be used to heat the room. Reduce indoor humidity to less than 50%.  Do not use a humidifier.  Control of Dog or Cat Allergen  Avoidance is the best way to manage a dog or cat allergy. If you have a dog or cat and are allergic to dog or cats, consider removing the dog or cat from the home. If you have a dog or cat but don't want to find it a new home, or if your family wants a pet even though someone in the household is allergic, here are some strategies that may help keep symptoms at bay:  1. Keep the pet out of your bedroom and restrict it to only a few rooms. Be advised that keeping the dog or cat in only one room will not limit the allergens to that room. 2. Don't pet, hug or kiss the dog or cat; if you do, wash your hands with soap and water. 3. High-efficiency particulate air (HEPA) cleaners run continuously in a bedroom or living room can reduce allergen levels over time. 4. Regular use of a high-efficiency vacuum cleaner or a central vacuum can reduce allergen levels. 5. Giving your dog or cat a bath at least once a week can reduce airborne allergen.  Control of Mold Allergen  Mold and fungi can grow on a variety of surfaces provided certain temperature and moisture conditions exist.  Outdoor molds grow on plants, decaying vegetation and soil.  The major outdoor mold, Alternaria and Cladosporium, are found in very  high numbers during hot and dry conditions.  Generally, a late Summer - Fall peak is seen for common outdoor fungal spores.  Rain will temporarily lower outdoor mold spore count, but counts rise rapidly when the rainy period ends.  The most important indoor molds are Aspergillus and Penicillium.  Dark, humid and poorly ventilated basements are ideal sites for mold growth.  The next most common sites of mold growth are the bathroom and the kitchen.  Outdoor [employer] 1. Use air conditioning and keep windows closed 2. Avoid exposure to decaying vegetation. 3. Avoid leaf raking. 4. Avoid grain handling. 5. Consider wearing a face mask if working in moldy areas.  Indoor Mold Control 1. Maintain humidity below 50%. 2. Clean washable surfaces with 5% bleach solution. 3. Remove sources e.g. Contaminated carpets.

## 2016-05-19 NOTE — Assessment & Plan Note (Addendum)
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  Skin tests peanut was negative today, however given her report of symptoms with even the smell of peanuts, she will avoid this food as well.  Skin test results to fish and shellfish were negative.  Recent blood test revealed borderline positive results to shellfish and negative results to fish panel, therefore an open graded oral challenge to fish will be considered.  She has symptoms with the consumption of grapes and chocolate and had positive skin tests of both of these foods, therefore she will avoid these foods as well.  Skin tests were borderline positive to cow's milk and tomato, however she states that she consumes these foods without systemic symptoms, therefore they most likely represent false positive results.  Continue avoidance of nuts, grapes, and chocolate and have access to epinephrine autoinjector in case of accidental ingestion followed by systemic symptoms.  Consider open graded oral challenge to fish and, on a separate occasion, shellfish.  Until these allergies have been definitively ruled out, she will continue avoidance of all seafood.

## 2016-05-19 NOTE — Assessment & Plan Note (Addendum)
   Aeroallergen avoidance measures have been discussed and provided in written form.  A prescription has been provided for levocetirizine, 5mg  daily as needed.  Continue fluticasone nasal spray, 2 sprays per nostril daily as needed.  I have also recommended nasal saline spray (i.e., Simply Saline) or nasal saline lavage (i.e., NeilMed) as needed and prior to medicated nasal sprays. The risks and benefits of aeroallergen immunotherapy have been discussed. The patient is motivated to initiate immunotherapy if insurance coverage is favorable. She will let us know how she would like to proceed.

## 2016-05-19 NOTE — Assessment & Plan Note (Signed)
   Treatment plan as outlined above for allergic rhinitis.  A prescription has been provided for Pazeo, one drop per eye daily as needed.  I have also recommended eye lubricant drops (i.e., Natural Tears) as needed. 

## 2016-05-19 NOTE — Progress Notes (Signed)
Follow-up Note  RE: Theresa Cruz MRN: [id number] DOB: 04-Jan-1986 Date of Office Visit: 05/19/2016  Primary care provider: Sharon Seller, NP Referring provider: Micheline Chapman, NP  History of present illness: Theresa Cruz is a 31 y.o. female presenting today for follow up and allergy skin testing.  She was most recently seen in this clinic on 03/04/2016 by Dr. Nelva Bush.  She has avoided fish and shellfish since 2009 because of itchy throat and upset stomach, even with the smell of these foods.  In 2006 she consumed mixed nuts and experienced pharyngeal pruritus.  She has avoided peanuts and tree nut since that time.  She avoids grapes because of a "filmy sensation" in her throat when she has eaten them in the past. She avoids chocolate because of associated migraine headaches. She also experiences nasal congestion, rhinorrhea, sneezing, nasal pruritus, and ocular pruritus.  The symptoms occur year around but tend to be more frequent and severe in the springtime and in the fall.   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  Skin tests peanut was negative today, however given her report of symptoms with even the smell of peanuts, she will avoid this food as well.  Skin test results to fish and shellfish were negative.  Recent blood test revealed borderline positive results to shellfish and negative results to fish panel, therefore an open graded oral challenge to fish will be considered.  She has symptoms with the consumption of grapes and chocolate and had positive skin tests of both of these foods, therefore she will avoid these foods as well.  Skin tests were borderline positive to cow's milk and tomato, however she states that she consumes these foods without systemic symptoms, therefore they most likely represent false positive results.  Continue avoidance of nuts, grapes, and chocolate and have access to  epinephrine autoinjector in case of accidental ingestion followed by systemic symptoms.  Consider open graded oral challenge to fish and, on a separate occasion, shellfish.  Until these allergies have been definitively ruled out, she will continue avoidance of all seafood.  Perennial and seasonal allergic rhinitis  Aeroallergen avoidance measures have been discussed and provided in written form.  A prescription has been provided for levocetirizine, 5mg  daily as needed.  Continue fluticasone nasal spray, 2 sprays per nostril daily as needed.  I have also recommended nasal saline spray (i.e., Simply Saline) or nasal saline lavage (i.e., NeilMed) as needed and prior to medicated nasal sprays. The risks and benefits of aeroallergen immunotherapy have been discussed. The patient is motivated to initiate immunotherapy if insurance coverage is favorable. She will let us know how she would like to proceed.  Allergic conjunctivitis  Treatment plan as outlined above for allergic rhinitis.  A prescription has been provided for Pazeo, one drop per eye daily as needed.  I have also recommended eye lubricant drops (i.e., Natural Tears) as needed.   Meds ordered this encounter  Medications  . EPINEPHrine 0.3 mg/0.3 mL IJ SOAJ injection    Sig: Inject 0.3 mLs (0.3 mg total) into the muscle once.    Dispense:  2 Device    Refill:  1  . levocetirizine (XYZAL) 5 MG tablet    Sig: Take 1 tablet (5 mg total) by mouth every evening.    Dispense:  30 tablet    Refill:  5  . Olopatadine HCl (PATADAY) 0.2 % SOLN    Sig: Place 1  drop into both eyes 1 day or 1 dose.    Dispense:  1 Bottle    Refill:  5    Diagnostics: Epicutaneous environmental testing: Positive to grass pollens, weed pollens, ragweed, tree pollens, molds, and dust mite antigen. Intradermal environmental testing: Positive to cat hair and dog epithelia. Food allergen skin testing: Positive to pecan, walnut, chocolate, and borderline  positive to cow's milk, tomato, and grape.  Note: Cashew was intentionally omitted based upon robust reaction with previous testing.    Physical examination: Blood pressure 118/70, pulse 71, temperature 98.1 F (36.7 C), temperature source Oral, resp. rate 16, SpO2 94 %.  General: Alert, interactive, in no acute distress. HEENT: TMs pearly gray, turbinates edematous with thick discharge, post-pharynx moderately erythematous. Neck: Supple without lymphadenopathy. Lungs: Clear to auscultation without wheezing, rhonchi or rales. CV: Normal S1, S2 without murmurs. Skin: Warm and dry, without lesions or rashes.  The following portions of the patient's history were reviewed and updated as appropriate: allergies, current medications, past family history, past medical history, past social history, past surgical history and problem list.  Allergies as of 05/19/2016      Reactions   Fish-derived Products Hives   Other Hives   Tree nuts   Imitrex [sumatriptan]    Peanuts [peanut Oil] Hives   Rizatriptan    Other reaction(s): Other (See Comments) Muscles tightening   Sumatriptan Succinate    Other reaction(s): Other (See Comments) Does not remember   Topamax [topiramate]       Medication List       Accurate as of 05/19/16  5:33 PM. Always use your most recent med list.          cetirizine 10 MG tablet Commonly known as:  ZYRTEC Take 1 tablet (10 mg total) by mouth daily.   EPINEPHrine 0.3 mg/0.3 mL Soaj injection Commonly known as:  EPI-PEN Inject 0.3 mLs (0.3 mg total) into the muscle once.   fluticasone 50 MCG/ACT nasal spray Commonly known as:  FLONASE Place 2 sprays into both nostrils daily.   levocetirizine 5 MG tablet Commonly known as:  XYZAL Take 1 tablet (5 mg total) by mouth every evening.   Olopatadine HCl 0.2 % Soln Commonly known as:  PATADAY Place 1 drop into both eyes 1 day or 1 dose.       Allergies  Allergen Reactions  . Fish-Derived Products Hives    . Other Hives    Tree nuts  . Imitrex [Sumatriptan]   . Peanuts [Peanut Oil] Hives  . Rizatriptan     Other reaction(s): Other (See Comments) Muscles tightening  . Sumatriptan Succinate     Other reaction(s): Other (See Comments) Does not remember  . Topamax [Topiramate]    Review of systems: Review of systems negative except as noted in HPI / PMHx or noted below: Constitutional: Negative.  HENT: Negative.   Eyes: Negative.  Respiratory: Negative.   Cardiovascular: Negative.  Gastrointestinal: Negative.  Genitourinary: Negative.  Musculoskeletal: Negative.  Neurological: Negative.  Endo/Heme/Allergies: Negative.  Cutaneous: Negative.  Past Medical History:  Diagnosis Date  . Allergy   . Asthma   . Urticaria     Family History  Problem Relation Age of Onset  . Cancer Father   . Cancer Maternal Grandfather   . Allergic rhinitis Neg Hx   . Angioedema Neg Hx   . Asthma Neg Hx   . Eczema Neg Hx   . Immunodeficiency Neg Hx   . Urticaria Neg Hx  Social History   Social History  . Marital status: Single    Spouse name: N/A  . Number of children: N/A  . Years of education: N/A   Occupational History  . Not on file.   Social History Main Topics  . Smoking status: Never Smoker  . Smokeless tobacco: Never Used  . Alcohol use No  . Drug use: No  . Sexual activity: Not on file   Other Topics Concern  . Not on file   Social History Narrative  . No narrative on file    I appreciate the opportunity to take part in Karisa's care. Please do not hesitate to contact me with questions.  Sincerely,   R. Edgar Frisk, MD

## 2016-07-08 ENCOUNTER — Ambulatory Visit (INDEPENDENT_AMBULATORY_CARE_PROVIDER_SITE_OTHER): Payer: BLUE CROSS/BLUE SHIELD | Admitting: Allergy

## 2016-07-08 ENCOUNTER — Encounter: Payer: Self-pay | Admitting: Allergy

## 2016-07-08 VITALS — BP 112/68 | HR 69 | Temp 98.1°F | Resp 16 | Ht 62.5 in | Wt 110.2 lb

## 2016-07-08 DIAGNOSIS — T7800XD Anaphylactic reaction due to unspecified food, subsequent encounter: Secondary | ICD-10-CM

## 2016-07-08 NOTE — Patient Instructions (Addendum)
Food allergy Oral challenge to cod today was successfully passed.  You are not cod allergic and thus should be able to tolerate other fish.  Would recommend slow introduction of other fish into diet.    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  Skin test to peanut was negative, however given report of symptoms with even the smell of peanuts, she will avoid this food as well.  Skin test results to shellfish were negative.  Recent blood test revealed borderline positive results to shellfish.  She has symptoms with the consumption of grapes and chocolate and had positive skin tests of both of these foods, therefore she will avoid these foods as well.  Skin tests were borderline positive to cow's milk and tomato, however she states that she consumes these foods without systemic symptoms, therefore they most likely represent false positive results.   Continue avoidance of shellfish, nuts, grapes, and chocolate at this time and have access to epinephrine autoinjector in case of accidental ingestion followed by systemic symptoms.  Consider open graded oral challenge to shellfish next (can bring in shrimp).  Until these allergies have been definitively ruled out, she will continue avoidance of shellfish.  Perennial and seasonal allergic rhinitis  Continue levocetirizine, 5mg  daily as needed.  Continue fluticasone nasal spray, 2 sprays per nostril daily as needed.   nasal saline spray (i.e., Simply Saline) or nasal saline lavage (i.e., NeilMed) as needed and prior to medicated nasal sprays. The risks and benefits of aeroallergen immunotherapy have been previously discussed. Let us know if you would like to proceed.  Allergic conjunctivitis  Treatment plan as outlined above for allergic rhinitis.  Pazeo, one drop per eye daily as needed.  eye lubricant drops (i.e., Natural Tears) as needed.   Follow-up for shellifish challenge with use of shrimp

## 2016-07-08 NOTE — Progress Notes (Signed)
Follow-up Note  RE: Theresa Cruz MRN: [id number] DOB: [birth year] Date of Office Visit: 07/08/2016   History of present illness: Theresa Cruz is a 31 y.o. female presenting today for oral food challenge to fish with use of cod.   She was last seen in the office on 05/19/2016 by Dr. Verlin Fester.   She has a history of asthma, allergic rhinitis and food allergy.   At the last visit she had skin testing done that was noted per Dr. 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  Skin tests peanut was negative today, however given her report of symptoms with even the smell of peanuts, she will avoid this food as well.  Skin test results to fish and shellfish were negative.  Recent blood test revealed borderline positive results to shellfish and negative results to fish panel, therefore an open graded oral challenge to fish will be considered.  She has symptoms with the consumption of grapes and chocolate and had positive skin tests of both of these foods, therefore she will avoid these foods as well.  Skin tests were borderline positive to cow's milk and tomato, however she states that she consumes these foods without systemic symptoms, therefore they most likely represent false positive results."  She was advised to continue avoidance of nuts, grapes, and chocolate.   Her previous serum IgE testing from 03/11/16 was negative for fish panel.   She has been doing well without any recent illnesses and has not had any antihistamines for the past 3 days.  She brought in cod for challenge today.     Review of systems: Review of Systems  Constitutional: Negative for chills, fever and malaise/fatigue.  HENT: Negative for congestion, sinus pain and sore throat.   Eyes: Negative for discharge and redness.  Respiratory: Negative for cough, shortness of breath and wheezing.   Cardiovascular: Negative for chest pain.  Gastrointestinal: Negative for abdominal pain, diarrhea, heartburn,  nausea and vomiting.  Musculoskeletal: Negative for joint pain and myalgias.  Skin: Negative for itching and rash.  Neurological: Negative for headaches.    All other systems negative unless noted above in HPI  Past medical/social/surgical/family history have been reviewed and are unchanged unless specifically indicated below.  No changes  Medication List: Allergies as of 07/08/2016      Reactions   Fish-derived Products Hives   Other Hives   Tree nuts   Imitrex [sumatriptan]    Peanuts [peanut Oil] Hives   Rizatriptan    Other reaction(s): Other (See Comments) Muscles tightening   Sumatriptan Succinate    Other reaction(s): Other (See Comments) Does not remember   Topamax [topiramate]       Medication List       Accurate as of 07/08/16 11:34 AM. Always use your most recent med list.          cetirizine 10 MG tablet Commonly known as:  ZYRTEC Take 1 tablet (10 mg total) by mouth daily.   fluticasone 50 MCG/ACT nasal spray Commonly known as:  FLONASE Place 2 sprays into both nostrils daily.   levocetirizine 5 MG tablet Commonly known as:  XYZAL Take 1 tablet (5 mg total) by mouth every evening.   Olopatadine HCl 0.2 % Soln Commonly known as:  PATADAY Place 1 drop into both eyes 1 day or 1 dose.       Known medication allergies: Allergies  Allergen Reactions  . Fish-Derived Products Hives  . Other Hives  Tree nuts  . Imitrex [Sumatriptan]   . Peanuts [Peanut Oil] Hives  . Rizatriptan     Other reaction(s): Other (See Comments) Muscles tightening  . Sumatriptan Succinate     Other reaction(s): Other (See Comments) Does not remember  . Topamax [Topiramate]      Physical examination: Blood pressure 118/76, pulse 71, temperature 98.1 F (36.7 C), temperature source Oral, resp. rate 17, height 5' 2.5" (1.588 m), weight 110 lb 3.2 oz (50 kg).  General: Alert, interactive, in no acute distress. HEENT: PERRLA, TMs pearly gray, turbinates minimally  edematous without discharge, post-pharynx non erythematous. Neck: Supple without lymphadenopathy. Lungs: Clear to auscultation without wheezing, rhonchi or rales. {no increased work of breathing. CV: Normal S1, S2 without murmurs. Abdomen: Nondistended, nontender. Skin: Warm and dry, without lesions or rashes. Extremities:  No clubbing, cyanosis or edema. Neuro:   Grossly intact.  Diagnositics/Labs: Oral food challenge to fish with use of Cod.  Protocol, benefits and risks of oral challenge discussed and verbal consent obtained.   She was given increasing doses of cod to consume 1 piece of cod fillet in 10 minute intervals.  She was observed for an additional 1 hour following completion of ingestion challenge.  She has no signs/symptoms of allergic reaction and tolerated challenge well today.  Vital signs repeated at completion and were stable.     Assessment and plan:   Food allergy Oral challenge to cod today was successfully passed.  You are not cod allergic and thus should be able to tolerate other fish.  Would recommend slow introduction of other fish into diet.    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  Skin test to peanut was negative, however given report of symptoms with even the smell of peanuts, she will avoid this food as well.  Skin test results to shellfish were negative.  Recent blood test revealed borderline positive results to shellfish.  She has symptoms with the consumption of grapes and chocolate and had positive skin tests of both of these foods, therefore she will avoid these foods as well.  Skin tests were borderline positive to cow's milk and tomato, however she states that she consumes these foods without systemic symptoms, therefore they most likely represent false positive results.   Continue avoidance of shellfish, nuts, grapes, and chocolate at this time and have access to epinephrine autoinjector in case of accidental ingestion  followed by systemic symptoms.  Consider open graded oral challenge to shellfish next (can bring in shrimp).  Until these allergies have been definitively ruled out, she will continue avoidance of shellfish.  Perennial and seasonal allergic rhinitis  Continue levocetirizine, 5mg  daily as needed.  Continue fluticasone nasal spray, 2 sprays per nostril daily as needed.   nasal saline spray (i.e., Simply Saline) or nasal saline lavage (i.e., NeilMed) as needed and prior to medicated nasal sprays. The risks and benefits of aeroallergen immunotherapy have been previously discussed. Let us know if you would like to proceed.  Allergic conjunctivitis  Treatment plan as outlined above for allergic rhinitis.  Pazeo, one drop per eye daily as needed.  eye lubricant drops (i.e., Natural Tears) as needed.   Follow-up for shellifish challenge with use of shrimp   I appreciate the opportunity to take part in Kharis's care. Please do not hesitate to contact me with questions.  Sincerely,   Prudy Feeler, MD Allergy/Immunology [facility]

## 2016-08-19 ENCOUNTER — Encounter: Payer: Self-pay | Admitting: Allergy

## 2016-08-19 ENCOUNTER — Ambulatory Visit (INDEPENDENT_AMBULATORY_CARE_PROVIDER_SITE_OTHER): Payer: BLUE CROSS/BLUE SHIELD | Admitting: Allergy

## 2016-08-19 VITALS — BP 110/70 | HR 70 | Temp 98.2°F | Resp 18

## 2016-08-19 DIAGNOSIS — T7800XD Anaphylactic reaction due to unspecified food, subsequent encounter: Secondary | ICD-10-CM

## 2016-08-19 NOTE — Progress Notes (Signed)
Follow-up Note  RE: Khushbu Pippen MRN: [id number] DOB: Apr 27, 1985 Date of Office Visit: 08/19/2016   History of present illness: Theresa Cruz is a 31 y.o. female presenting today for oral food challenge to shrimp.   She was last seen in the office on 07/08/2016 by myself for a fish challenge with use of cod which she successfully passed.   She has a history of asthma, allergic rhinitis and food allergy.  She has negative skin testing for shellfish from April 2018.  Serum IgE levels to shrimp was 0.12ku/L.   She reports she prepared the shrimp last night by peeling them and did not have any contact reactions.  She has been doing well without any recent illnesses and has not had any antihistamines for the past 3 days.  Review of systems: Review of Systems  Constitutional: Negative for chills, fever and malaise/fatigue.  HENT: Negative for congestion, ear discharge, ear pain, nosebleeds, sore throat and tinnitus.   Eyes: Negative for discharge and redness.  Respiratory: Negative for cough, shortness of breath and wheezing.   Cardiovascular: Negative for chest pain.  Gastrointestinal: Negative for abdominal pain, blood in stool, constipation, diarrhea, nausea and vomiting.  Musculoskeletal: Negative for joint pain.  Skin: Negative for itching and rash.  Neurological: Negative for headaches.    All other systems negative unless noted above in HPI  Past medical/social/surgical/family history have been reviewed and are unchanged unless specifically indicated below.  No changes  Medication List: Allergies as of 08/19/2016      Reactions   Other Hives   Tree nuts   Imitrex [sumatriptan]    Peanuts [peanut Oil] Hives   Rizatriptan    Other reaction(s): Other (See Comments) Muscles tightening   Sumatriptan Succinate    Other reaction(s): Other (See Comments) Does not remember   Topamax [topiramate]       Medication List       Accurate as of 08/19/16 12:16 PM. Always  use your most recent med list.          cetirizine 10 MG tablet Commonly known as:  ZYRTEC Take 1 tablet (10 mg total) by mouth daily.   fluticasone 50 MCG/ACT nasal spray Commonly known as:  FLONASE Place 2 sprays into both nostrils daily.   levocetirizine 5 MG tablet Commonly known as:  XYZAL Take 1 tablet (5 mg total) by mouth every evening.   Olopatadine HCl 0.2 % Soln Commonly known as:  PATADAY Place 1 drop into both eyes 1 day or 1 dose.       Known medication allergies: Allergies  Allergen Reactions  . Other Hives    Tree nuts  . Imitrex [Sumatriptan]   . Peanuts [Peanut Oil] Hives  . Rizatriptan     Other reaction(s): Other (See Comments) Muscles tightening  . Sumatriptan Succinate     Other reaction(s): Other (See Comments) Does not remember  . Topamax [Topiramate]      Physical examination: Blood pressure 110/70, pulse 70, temperature 98.2 F (36.8 C), resp. rate 18, SpO2 97 %.  General: Alert, interactive, in no acute distress. HEENT: PERRLA, TMs pearly gray, turbinates minimally edematous without discharge, post-pharynx non erythematous. Neck: Supple without lymphadenopathy. Lungs: Clear to auscultation without wheezing, rhonchi or rales. {no increased work of breathing. CV: Normal S1, S2 without murmurs. Abdomen: Nondistended, nontender. Skin: Warm and dry, without lesions or rashes. Extremities:  No clubbing, cyanosis or edema. Neuro:   Grossly intact.  Diagnositics/Labs: Spirometry is normal with FEV1 2.95L 117%;  FVC 3.51L 121%  Oral food challenge to shrimp.  Protocol, benefits and risks of oral challenge discussed and verbal consent obtained.   She was given increasing doses of shrimp to consume total of 60.5 g of shrimp; each dose was given in 5-10 minute intervals.  She was observed for an additional 1 hour following completion of ingestion challenge.  She had no signs/symptoms of allergic reaction and tolerated challenge well today.   Vital signs repeated at completion and were stable.    Assessment and plan:   Food allergy Oral challenge to shrimp today was successfully passed.  You are not shrimp allergic and thus should be able to tolerate other crustaceans (lobster, crab).  Would recommend slow introduction of other shellfish into diet.    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  Skin test to peanut was negative, however given report of symptoms with even the smell of peanuts, she will avoid this food as well.  Symptoms with the consumption of grapes and chocolate and had positive skin tests to both of these foods, therefore she will avoid these foods as well.  Skin tests were borderline positive to cow's milk and tomato, however she states that she consumes these foods without systemic symptoms, therefore they most likely represent false positive results.   Continue avoidance of nuts, grapes, and chocolate at this time and have access to epinephrine autoinjector in case of accidental ingestion followed by systemic symptoms.   Perennial and seasonal allergic rhinoconjunctivitis  Continue levocetirizine, 5mg  daily as needed.  Continue fluticasone nasal spray, 2 sprays per nostril daily as needed.   nasal saline spray (i.e., Simply Saline) or nasal saline lavage (i.e., NeilMed) as needed and prior to medicated nasal sprays. Pazeo, one drop per eye daily as needed. The risks and benefits of aeroallergen immunotherapy have been previously discussed. Let us know if you would like to proceed.    Follow-up 6-9 months or sooner if needed   I appreciate the opportunity to take part in Naquita's care. Please do not hesitate to contact me with questions.  Sincerely,   Prudy Feeler, MD Allergy/Immunology [facility] [place]

## 2016-08-19 NOTE — Patient Instructions (Addendum)
Food allergy Oral challenge to shrimp today was successfully passed.  You are not shrimp allergic and thus should be able to tolerate other crustaceans (lobster, crab).  Would recommend slow introduction of other shellfish into diet.    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  Skin test to peanut was negative, however given report of symptoms with even the smell of peanuts, she will avoid this food as well.  Symptoms with the consumption of grapes and chocolate and had positive skin tests to both of these foods, therefore she will avoid these foods as well.  Skin tests were borderline positive to cow's milk and tomato, however she states that she consumes these foods without systemic symptoms, therefore they most likely represent false positive results.   Continue avoidance of nuts, grapes, and chocolate at this time and have access to epinephrine autoinjector in case of accidental ingestion followed by systemic symptoms.    Perennial and seasonal allergic rhinoconjunctivitis  Continue levocetirizine, 5mg  daily as needed.  Continue fluticasone nasal spray, 2 sprays per nostril daily as needed.   nasal saline spray (i.e., Simply Saline) or nasal saline lavage (i.e., NeilMed) as needed and prior to medicated nasal sprays. Pazeo, one drop per eye daily as needed. The risks and benefits of aeroallergen immunotherapy have been previously discussed. Let us know if you would like to proceed.    Follow-up 6-9 months or sooner if needed

## 2017-07-18 ENCOUNTER — Telehealth: Payer: Self-pay | Admitting: Allergy

## 2017-07-18 NOTE — Telephone Encounter (Signed)
Spoke to patient she would like a refill on albuterol upon review of chart patient has never been prescribed albuterol from [place]. Advise patient she needs to contact whoever prescribed last or come in to be seen since it is a new problem. Patient verbalized understanding.

## 2017-07-18 NOTE — Telephone Encounter (Signed)
Pt called and need a refill on a inhaler called into [facility] [phone]. 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

## 2018-03-27 ENCOUNTER — Other Ambulatory Visit: Payer: Self-pay | Admitting: Nurse Practitioner

## 2018-03-27 DIAGNOSIS — M5442 Lumbago with sciatica, left side: Principal | ICD-10-CM

## 2018-03-27 DIAGNOSIS — M5441 Lumbago with sciatica, right side: Principal | ICD-10-CM

## 2018-03-27 DIAGNOSIS — G8929 Other chronic pain: Secondary | ICD-10-CM

## 2018-03-28 ENCOUNTER — Other Ambulatory Visit (HOSPITAL_COMMUNITY): Payer: Self-pay | Admitting: Nurse Practitioner

## 2018-03-28 DIAGNOSIS — M5441 Lumbago with sciatica, right side: Principal | ICD-10-CM

## 2018-03-28 DIAGNOSIS — G8929 Other chronic pain: Secondary | ICD-10-CM

## 2018-03-28 DIAGNOSIS — M5442 Lumbago with sciatica, left side: Principal | ICD-10-CM

## 2018-04-01 ENCOUNTER — Other Ambulatory Visit: Payer: No Typology Code available for payment source

## 2018-04-07 ENCOUNTER — Other Ambulatory Visit (HOSPITAL_COMMUNITY): Payer: No Typology Code available for payment source

## 2018-04-09 ENCOUNTER — Telehealth: Payer: Self-pay

## 2018-04-09 NOTE — Telephone Encounter (Signed)
Pt calling asking for refills on medications.  Pt has not been seen since 07/2016.  Informed pt of our policy that if you have not been seen in a year, we are unable to refill medications.  Pt would need to make an appointment.  Also, informed pt that she could call her PCP and obtain refills.  Pt verbalized understanding and stated that she would call her PCP and get her prescriptions.

## 2018-04-10 ENCOUNTER — Other Ambulatory Visit: Payer: Self-pay

## 2018-04-10 ENCOUNTER — Ambulatory Visit (HOSPITAL_COMMUNITY)
Admission: RE | Admit: 2018-04-10 | Discharge: 2018-04-10 | Disposition: A | Discharge status: Home / Self Care | Source: Ambulatory Visit | Attending: Nurse Practitioner | Admitting: Nurse Practitioner

## 2018-04-10 DIAGNOSIS — M5442 Lumbago with sciatica, left side: Secondary | ICD-10-CM | POA: Insufficient documentation

## 2018-04-10 DIAGNOSIS — M5441 Lumbago with sciatica, right side: Secondary | ICD-10-CM | POA: Diagnosis present

## 2018-04-10 DIAGNOSIS — G8929 Other chronic pain: Secondary | ICD-10-CM

## 2018-04-12 DIAGNOSIS — M2550 Pain in unspecified joint: Secondary | ICD-10-CM | POA: Insufficient documentation

## 2018-04-12 DIAGNOSIS — M7918 Myalgia, other site: Secondary | ICD-10-CM | POA: Insufficient documentation

## 2018-04-12 DIAGNOSIS — M47816 Spondylosis without myelopathy or radiculopathy, lumbar region: Secondary | ICD-10-CM | POA: Insufficient documentation

## 2018-04-12 DIAGNOSIS — G894 Chronic pain syndrome: Secondary | ICD-10-CM | POA: Insufficient documentation

## 2018-04-12 DIAGNOSIS — M5459 Other low back pain: Secondary | ICD-10-CM | POA: Insufficient documentation

## 2018-04-12 DIAGNOSIS — G8929 Other chronic pain: Secondary | ICD-10-CM | POA: Insufficient documentation

## 2018-04-14 DIAGNOSIS — G43909 Migraine, unspecified, not intractable, without status migrainosus: Secondary | ICD-10-CM

## 2018-04-14 DIAGNOSIS — R200 Anesthesia of skin: Secondary | ICD-10-CM

## 2018-04-14 DIAGNOSIS — R202 Paresthesia of skin: Secondary | ICD-10-CM

## 2018-04-14 HISTORY — DX: Migraine, unspecified, not intractable, without status migrainosus: G43.909

## 2018-04-14 HISTORY — DX: Paresthesia of skin: R20.2

## 2018-04-14 HISTORY — DX: Anesthesia of skin: R20.0

## 2018-08-29 DIAGNOSIS — D259 Leiomyoma of uterus, unspecified: Secondary | ICD-10-CM | POA: Insufficient documentation

## 2018-09-22 DIAGNOSIS — M797 Fibromyalgia: Secondary | ICD-10-CM | POA: Insufficient documentation

## 2018-09-22 HISTORY — DX: Fibromyalgia: M79.7

## 2019-04-19 ENCOUNTER — Other Ambulatory Visit: Payer: Self-pay

## 2019-04-19 ENCOUNTER — Encounter: Payer: Self-pay | Admitting: Family Medicine

## 2019-04-19 ENCOUNTER — Ambulatory Visit (INDEPENDENT_AMBULATORY_CARE_PROVIDER_SITE_OTHER): Admitting: Family Medicine

## 2019-04-19 ENCOUNTER — Ambulatory Visit: Payer: BLUE CROSS/BLUE SHIELD | Admitting: Family Medicine

## 2019-04-19 VITALS — BP 102/72 | HR 74 | Temp 98.0°F | Resp 16 | Ht 63.0 in | Wt 108.2 lb

## 2019-04-19 DIAGNOSIS — J4520 Mild intermittent asthma, uncomplicated: Secondary | ICD-10-CM | POA: Insufficient documentation

## 2019-04-19 DIAGNOSIS — T7800XA Anaphylactic reaction due to unspecified food, initial encounter: Secondary | ICD-10-CM

## 2019-04-19 DIAGNOSIS — H1013 Acute atopic conjunctivitis, bilateral: Secondary | ICD-10-CM

## 2019-04-19 DIAGNOSIS — J3089 Other allergic rhinitis: Secondary | ICD-10-CM | POA: Diagnosis not present

## 2019-04-19 MED ORDER — FLUTICASONE PROPIONATE 50 MCG/ACT NA SUSP: 2.0000 | Freq: Every day | NASAL | 5 refills | Status: DC | PRN

## 2019-04-19 MED ORDER — LEVOCETIRIZINE DIHYDROCHLORIDE 5 MG PO TABS: 5.0000 mg | ORAL_TABLET | Freq: Every evening | ORAL | 5 refills | Status: DC

## 2019-04-19 NOTE — Patient Instructions (Addendum)
Asthma Your lungs sounded clear and your lung testing was normal today Continue albuterol 2 puffs every 4 hours as needed for cough or wheeze  Allergic rhinitis Continue Xyzal 5 mg once a day as needed for a runny nose Continue Flonase 2 sprays in each nostril once a day as needed for a stuffy nose Consider saline nasal rinses as needed for nasal symptoms. Use this before any medicated nasal sprays for best result We will get order some labs to evaluate your allergies. We will call you when these lab results become available  Allergic conjunctivitis Begin Pataday one drop in each eye once a day as needed for red, itchy eyes  Food allergy Continue to avoid fish, shellfish, chocolate, grapes, coconut, sesame, strawberry, peach, and tomato.  We will get order some labs to evaluate your allergies. We will call you when these lab results become available  In case of an allergic reaction, take Benadryl 50 mg every 4 hours, and if life-threatening symptoms occur, inject with AuviQ 0.3 mg.  Call the clinic if this treatment plan is not working well for you  Follow up in 2 months or sooner if needed.

## 2019-04-19 NOTE — Progress Notes (Signed)
[place] [address] Dept: [phone]  FOLLOW UP NOTE  Patient ID: Theresa Cruz, female    DOB: Dec 29, 1985  Age: 34 y.o. MRN: [id number] Date of Office Visit: 04/19/2019  Assessment  Chief Complaint: Asthma and Food Intolerance (seafood)  HPI Theresa Cruz is a 34 year old female who presents to the clinic today for an acute visit.  She was last seen in this clinic on 08/20/2016 by Dr. Nelva Bush for evaluation of asthma, allergic rhinitis, and food allergy.  At today's visit, she reports that she had an appointment at her sleep doctor earlier today and one of the providers reported an adventitious lung sound.  She reports that over the last few days she has had random episodes where she feels it is difficult to take a deep breath.  She denies shortness of breath, and wheeze.  She reports that last night she began to have a productive cough with clear to light mucous.  She reports that over the last several months she has had an " uncomfortable dull pain" that occurs in various places including the extensor surfaces of her elbows, her pinky fingers, and individually each side of her chest that lasts for 10 minutes and then resolves and occurs in a different area about 2 times a week.  She denies heartburn and radiating pain.  She reports that she last used her albuterol 1 month ago when she was cleaning out an area that had a water leak.  Allergic rhinitis is reported as moderately well controlled with nasal congestion occurring in the morning and resolves with activity.  She denies nasal drainage and postnasal drainage.  She is not currently using levocetirizine or Flonase.  Allergic conjunctivitis is reported as well controlled with no medical intervention.  She reports that she is currently avoiding peanuts, tree nuts, grapes, chocolate, fish, shellfish, coconut, sesame, sunflower, strawberry, peach, and tomato.  She reports that her EpiPen's are up-to-date and she is  received in use that about 1 month ago.  She is interested in retesting to food and environmental allergens via blood test at today's visit.  Her current medications are listed in the chart   Drug Allergies:  Allergies  Allergen Reactions  . Other Hives    Tree nuts  . Imitrex [Sumatriptan]   . Peanuts [Peanut Oil] Hives  . Rizatriptan     Other reaction(s): Other (See Comments) Muscles tightening  . Sumatriptan Succinate     Other reaction(s): Other (See Comments) Does not remember  . Topamax [Topiramate]     Physical Exam: BP 102/72   Pulse 74   Temp 98 F (36.7 C) (Temporal)   Resp 16   Ht 5\' 3"  (1.6 m)   Wt 108 lb 3.2 oz (49.1 kg)   SpO2 99%   BMI 19.17 kg/m    Physical Exam Vitals reviewed.  Constitutional:      Appearance: Normal appearance.  HENT:     Head: Normocephalic and atraumatic.     Right Ear: Tympanic membrane normal.     Left Ear: Tympanic membrane normal.     Nose:     Comments: Bilateral nares slightly erythematous with no nasal drainage.  Pharynx normal.  Ears normal.  Eyes normal.    Mouth/Throat:     Pharynx: Oropharynx is clear.  Eyes:     Conjunctiva/sclera: Conjunctivae normal.  Cardiovascular:     Rate and Rhythm: Normal rate and regular rhythm.     Heart sounds: Normal heart sounds. No murmur.  Pulmonary:     Effort: Pulmonary effort is normal.     Breath sounds: Normal breath sounds.     Comments: Lungs clear to auscultation.  No adventitious lung noted during her exam Musculoskeletal:        General: Normal range of motion.     Cervical back: Normal range of motion and neck supple.  Skin:    General: Skin is warm and dry.  Neurological:     Mental Status: She is alert and oriented to person, place, and time.  Psychiatric:        Mood and Affect: Mood normal.        Behavior: Behavior normal.        Thought Content: Thought content normal.        Judgment: Judgment normal.     Diagnostics: FVC 3.62, FEV1 2.94.  Predicted  FVC 3.09, predicted FEV1 2.63.  Spirometry indicates normal ventilatory function.  Assessment and Plan: 1. Mild intermittent asthma without complication   2. Food allergy   3. Perennial and seasonal allergic rhinitis   4. Allergic conjunctivitis of both eyes     Meds ordered this encounter  Medications  . levocetirizine (XYZAL) 5 MG tablet    Sig: Take 1 tablet (5 mg total) by mouth every evening.    Dispense:  30 tablet    Refill:  5  . fluticasone (FLONASE) 50 MCG/ACT nasal spray    Sig: Place 2 sprays into both nostrils daily as needed for allergies or rhinitis.    Dispense:  16 g    Refill:  5    Patient Instructions  Asthma Your lungs sounded clear and your lung testing was normal today Continue albuterol 2 puffs every 4 hours as needed for cough or wheeze  Allergic rhinitis Continue Xyzal 5 mg once a day as needed for a runny nose Continue Flonase 2 sprays in each nostril once a day as needed for a stuffy nose Consider saline nasal rinses as needed for nasal symptoms. Use this before any medicated nasal sprays for best result We will get order some labs to evaluate your allergies. We will call you when these lab results become available  Allergic conjunctivitis Begin Pataday one drop in each eye once a day as needed for red, itchy eyes  Food allergy Continue to avoid fish, shellfish, chocolate, grapes, coconut, sesame, strawberry, peach, and tomato.  We will get order some labs to evaluate your allergies. We will call you when these lab results become available  In case of an allergic reaction, take Benadryl 50 mg every 4 hours, and if life-threatening symptoms occur, inject with AuviQ 0.3 mg.  Call the clinic if this treatment plan is not working well for you  Follow up in 2 months or sooner if needed.   Return in about 2 months (around 06/19/2019), or if symptoms worsen or fail to improve.    Thank you for the opportunity to care for this patient.  Please do not  hesitate to contact me with questions.  Gareth Morgan, FNP [facility]

## 2019-04-23 LAB — ALLERGENS, ZONE 2
Alternaria Alternata IgE: <0.1 kU/L
Amer Sycamore IgE Qn: 0.13 kU/L — AB
Aspergillus Fumigatus IgE: <0.1 kU/L
Bahia Grass IgE: 37.8 kU/L — AB
Bermuda Grass IgE: 9.94 kU/L — AB
Cat Dander IgE: <0.1 kU/L
Cedar, Mountain IgE: 0.14 kU/L — AB
Cladosporium Herbarum IgE: 2.14 kU/L — AB
Cockroach, American IgE: <0.1 kU/L
Common Silver Birch IgE: 0.49 kU/L — AB
D Farinae IgE: 0.81 kU/L — AB
D Pteronyssinus IgE: 0.61 kU/L — AB
Dog Dander IgE: 0.39 kU/L — AB
Elm, American IgE: 0.13 kU/L — AB
Hickory, White IgE: 0.75 kU/L — AB
Johnson Grass IgE: 20.1 kU/L — AB
Maple/Box Elder IgE: 0.14 kU/L — AB
Mucor Racemosus IgE: <0.1 kU/L
Mugwort IgE Qn: 0.12 kU/L — AB
Nettle IgE: 0.15 kU/L — AB
Oak, White IgE: 1.44 kU/L — AB
Penicillium Chrysogen IgE: <0.1 kU/L
Pigweed, Rough IgE: <0.1 kU/L
Plantain, English IgE: 0.29 kU/L — AB
Ragweed, Short IgE: 0.14 kU/L — AB
Sheep Sorrel IgE Qn: 0.25 kU/L — AB
Stemphylium Herbarum IgE: <0.1 kU/L
Sweet gum IgE RAST Ql: 0.23 kU/L — AB
Timothy Grass IgE: 54 kU/L — AB
White Mulberry IgE: <0.1 kU/L

## 2019-04-23 LAB — ALLERGY PANEL 18, NUT MIX GROUP
Allergen Coconut IgE: <0.1 kU/L
F020-IgE Almond: <0.1 kU/L
F202-IgE Cashew Nut: 1.38 kU/L — AB
Hazelnut (Filbert) IgE: 0.5 kU/L — AB
Peanut IgE: <0.1 kU/L
Pecan Nut IgE: 0.14 kU/L — AB
Sesame Seed IgE: 0.11 kU/L — AB

## 2019-04-23 LAB — ALLERGEN PROFILE, SHELLFISH
Clam IgE: <0.1 kU/L
F023-IgE Crab: <0.1 kU/L
F080-IgE Lobster: <0.1 kU/L
F290-IgE Oyster: <0.1 kU/L
Scallop IgE: <0.1 kU/L
Shrimp IgE: <0.1 kU/L

## 2019-04-23 LAB — ALLERGEN PROFILE, FOOD-FISH
Allergen Mackerel IgE: <0.1 kU/L
Allergen Salmon IgE: <0.1 kU/L
Allergen Trout IgE: <0.1 kU/L
Allergen Walley Pike IgE: <0.1 kU/L
Codfish IgE: <0.1 kU/L
Halibut IgE: <0.1 kU/L
Tuna: <0.1 kU/L

## 2019-04-23 LAB — IGE MILK W/ COMPONENT REFLEX: F002-IgE Milk: 0.33 kU/L — AB

## 2019-04-23 LAB — ALLERGEN, STRAWBERRY, F44: Allergen Strawberry IgE: <0.1 kU/L

## 2019-04-23 LAB — ALLERGEN PEACH F95: Allergen, Peach f95: 0.14 kU/L — AB

## 2019-04-23 LAB — ALLERGEN, CASEIN, F78: F078-IgE Casein: <0.1 kU/L

## 2019-04-23 LAB — ALLERGEN, BRAZIL NUT, F18: Brazil Nut IgE: <0.1 kU/L

## 2019-04-23 LAB — ALLERGEN GRAPE F259: Allergen Grape IgE: <0.1 kU/L

## 2019-04-23 LAB — ALLERGEN, BLUEBERRY, RF288: Allergen Blueberry IgE: <0.1 kU/L

## 2019-04-23 LAB — ALLERGEN CHOCOLATE: Chocolate/Cacao IgE: <0.1 kU/L

## 2019-04-23 LAB — ALLERGEN WALNUT F256: Walnut IgE: 0.27 kU/L — AB

## 2019-04-23 NOTE — Progress Notes (Signed)
Can you please call this patient and let he know her environmental allergy testing via blood work was positive to dust mite, dog dander, grass pollen, tree pollen, weed pollen, ragweed pollen, and mold. Her food testing by blood indicates sensitivity to sesame seed, tree nuts, milk, and peach. Please have her avoid these foods and any foods that are causing her to have adverse reactions. Please send out an emergency action plan with the foods as listed above. Please have her carry an epinephrine device at all times. Please have her follow up with skin testing when it is convenient for her and have her stop antihistamines for 3 days before skin testing. Thank you

## 2019-04-24 NOTE — Progress Notes (Signed)
Can you please add on the foods that she is interested in and sign the order? Thank you

## 2019-04-25 ENCOUNTER — Telehealth: Payer: Self-pay | Admitting: *Deleted

## 2019-04-25 DIAGNOSIS — T7800XA Anaphylactic reaction due to unspecified food, initial encounter: Secondary | ICD-10-CM

## 2019-04-25 NOTE — Telephone Encounter (Signed)
Food testing ordered for patient per Webb Silversmith.

## 2019-04-25 NOTE — Progress Notes (Signed)
She should avoid all the foods that she had been avoiding, even if they are negative via blood work, and any new foods that have now become positive. I would advise skin testing to these foods before eating them or doing an office food challenge. Please let her know to have access to epinephrine auto-injector set at all times. Thank you

## 2019-04-30 ENCOUNTER — Encounter: Payer: Self-pay | Admitting: Family Medicine

## 2019-05-01 MED ORDER — OLOPATADINE HCL 0.1 % OP SOLN: 1.0000 [drp] | Freq: Two times a day (BID) | OPHTHALMIC | 5 refills | Status: DC

## 2019-05-01 NOTE — Addendum Note (Signed)
Addended by: Valere Dross on: 05/01/2019 09:14 AM   Modules accepted: Orders

## 2019-05-02 ENCOUNTER — Ambulatory Visit

## 2019-05-02 ENCOUNTER — Ambulatory Visit: Attending: Internal Medicine

## 2019-05-02 DIAGNOSIS — Z23 Encounter for immunization: Secondary | ICD-10-CM

## 2019-05-02 LAB — ALLERGEN, POMEGRANATE
Class Interpretation: 0
Pomegranate IgE: <0.35 kU/L (ref <0.35)

## 2019-05-02 LAB — ALLERGEN, MANGO, F91: Mango IgE: <0.1 kU/L

## 2019-05-02 LAB — ALLERGEN, KIWI FRUIT, F84: Kiwi Fruit: 0.11 kU/L — AB

## 2019-05-02 LAB — ALLERGEN BANANA: Allergen Banana IgE: 0.16 kU/L — AB

## 2019-05-02 LAB — ALLERGEN, PINEAPPLE, F210: Pineapple IgE: 0.18 kU/L — AB

## 2019-05-02 LAB — SPECIMEN STATUS REPORT

## 2019-05-02 NOTE — Progress Notes (Signed)
   Covid-19 Vaccination Clinic  Name:  Theresa Cruz    MRN: [id number] DOB: 1985-03-22  05/02/2019  Ms. Reveal was observed post Covid-19 immunization for 30 minutes based on pre-vaccination screening without incident. She was provided with Vaccine Information Sheet and instruction to access the V-Safe system.   [name] was instructed to call 911 with any severe reactions post vaccine: Marland Kitchen Difficulty breathing  . Swelling of face and throat  . A fast heartbeat  . A bad rash all over body  . Dizziness and weakness   Immunizations Administered    Name Date Dose VIS Date Route   Pfizer COVID-19 Vaccine 05/02/2019  8:54 AM 0.3 mL 01/04/2019 Intramuscular   Manufacturer: [place]   Lot: [id number]   [place]: [id number]

## 2019-05-02 NOTE — Progress Notes (Signed)
Can you please let this patient know that her labs have resulted. Foods that were positive were kiwi fruit, pineapple, banana, sesame, hazelnut, peach, cashew, walnut, milk, and pecan.  She should avoid these foods until she gets skin testing.  Foods that were negative include chocolate, pomegranate, mango, fish, blueberry, casein, strawberry, grape.  Can begin eating these foods unless she has had a previous reaction from these foods.  Foods that were negative at this blood draw but were negative at the last blood draw were shellfish and peanut.  She should avoid shellfish and peanut until she can get a skin test.  She needs to carry her epinephrine autoinjector at all times.  Thank you please call me with any questions

## 2019-05-28 ENCOUNTER — Ambulatory Visit: Attending: Internal Medicine

## 2019-05-28 DIAGNOSIS — Z23 Encounter for immunization: Secondary | ICD-10-CM

## 2019-05-28 NOTE — Progress Notes (Signed)
   Covid-19 Vaccination Clinic  Name:  Theresa Cruz    MRN: [id number] DOB: 1985/05/19  05/28/2019  [name] was observed post Covid-19 immunization for 15 minutes without incident. She was provided with Vaccine Information Sheet and instruction to access the V-Safe system.   [name] was instructed to call 911 with any severe reactions post vaccine: Marland Kitchen Difficulty breathing  . Swelling of face and throat  . A fast heartbeat  . A bad rash all over body  . Dizziness and weakness   Immunizations Administered    Name Date Dose VIS Date Route   Pfizer COVID-19 Vaccine 05/28/2019  9:15 AM 0.3 mL 03/20/2018 Intramuscular   Manufacturer: [place]   Lot: [id number]   [place]: [id number]

## 2019-06-21 ENCOUNTER — Ambulatory Visit: Admitting: Allergy

## 2019-09-13 DIAGNOSIS — E7800 Pure hypercholesterolemia, unspecified: Secondary | ICD-10-CM | POA: Insufficient documentation

## 2020-10-21 ENCOUNTER — Encounter: Payer: Self-pay | Admitting: Allergy

## 2020-10-21 ENCOUNTER — Ambulatory Visit (INDEPENDENT_AMBULATORY_CARE_PROVIDER_SITE_OTHER): Admitting: Allergy

## 2020-10-21 ENCOUNTER — Other Ambulatory Visit: Payer: Self-pay

## 2020-10-21 VITALS — BP 118/80 | HR 87 | Temp 97.7°F | Resp 18 | Ht 63.0 in | Wt 111.4 lb

## 2020-10-21 DIAGNOSIS — H1013 Acute atopic conjunctivitis, bilateral: Secondary | ICD-10-CM

## 2020-10-21 DIAGNOSIS — J4520 Mild intermittent asthma, uncomplicated: Secondary | ICD-10-CM

## 2020-10-21 DIAGNOSIS — T781XXD Other adverse food reactions, not elsewhere classified, subsequent encounter: Secondary | ICD-10-CM

## 2020-10-21 DIAGNOSIS — J3089 Other allergic rhinitis: Secondary | ICD-10-CM | POA: Diagnosis not present

## 2020-10-21 DIAGNOSIS — T7800XA Anaphylactic reaction due to unspecified food, initial encounter: Secondary | ICD-10-CM

## 2020-10-21 MED ORDER — LEVOCETIRIZINE DIHYDROCHLORIDE 5 MG PO TABS: 5.0000 mg | ORAL_TABLET | Freq: Every evening | ORAL | 5 refills | Status: DC

## 2020-10-21 MED ORDER — EPINEPHRINE 0.3 MG/0.3ML IJ SOAJ
0.3000 mg | INTRAMUSCULAR | 2 refills | Status: DC | PRN
Start: 2020-10-21 — End: 2020-10-21

## 2020-10-21 MED ORDER — ALBUTEROL SULFATE HFA 108 (90 BASE) MCG/ACT IN AERS: INHALATION_SPRAY | RESPIRATORY_TRACT | 2 refills | Status: DC

## 2020-10-21 MED ORDER — OLOPATADINE HCL 0.1 % OP SOLN: 1.0000 [drp] | Freq: Two times a day (BID) | OPHTHALMIC | 5 refills | Status: DC

## 2020-10-21 MED ORDER — EPINEPHRINE 0.3 MG/0.3ML IJ SOAJ: 0.3000 mg | INTRAMUSCULAR | 2 refills | Status: DC | PRN

## 2020-10-21 MED ORDER — RYALTRIS 665-25 MCG/ACT NA SUSP: 2.0000 | Freq: Two times a day (BID) | NASAL | 3 refills | Status: DC | PRN

## 2020-10-21 NOTE — Progress Notes (Signed)
Follow-up Note  RE: Theresa Cruz MRN: [id number] DOB: [birth year] Date of Office Visit: 10/21/2020   History of present illness: Theresa Cruz is a 35 y.o. female presenting today for skin testing.  She was last seen in the office on 04/19/2019 by our nurse practitioner Ambs.   She would like to have food allergy testing done today.  She did have serum IgE food allergy testing done in March 2021.  She avoids a variety of different foods.  She notes there is some foods that even with inhalation she develops symptoms.  She states this can happen with seafood/Shellfish.  She has noted that since fruits can cause her throat to get itchy.  She would like to be able to at least make fruit smoothies if she could tolerate this.  She states she also would like to be able to eat peanut if she is able to do so as this can be a healthy fat.  She is concerned as her on her recent physical her lab work her patient showed a elevated sedimentation rate as well as elevated cholesterol panel.  She was provided with a list of foods that could help lower her cholesterol as well as foods that can be anti-inflammatory.  She states a lot of foods on these list however she has had issues with in the past and does not eat. She has held antihistamines for testing today. She also states over the past week she has noted some chest tightness.  Her albuterol is expired. She does state if she had had an expired albuterol she likely would have used it. 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  She did purchase nose filters that she states helps some she also wears her mask on top of the filter. She did start using her Xyzal and Flonase over the past week.  She did stop the Xyzal last Thursday anticipation for this testing.  Review of systems: Review of Systems  Constitutional: Negative.   HENT: Negative.    Eyes: Negative.    Respiratory:         See HPI  Cardiovascular: Negative.   Gastrointestinal: Negative.   Musculoskeletal: Negative.   Skin: Negative.   Neurological: Negative.    All other systems negative unless noted above in HPI  Past medical/social/surgical/family history have been reviewed and are unchanged unless specifically indicated below.  No changes  Medication List: Current Outpatient Medications  Medication Sig Dispense Refill   EPINEPHrine 0.3 mg/0.3 mL IJ SOAJ injection Inject 0.3 mg into the muscle as needed for anaphylaxis. 1 each 2   fluticasone (FLONASE) 50 MCG/ACT nasal spray Place 2 sprays into both nostrils daily as needed for allergies or rhinitis. 16 g 5   Olopatadine-Mometasone (RYALTRIS) 665-25 MCG/ACT SUSP Place 2 sprays into the nose 2 (two) times daily as needed (Runny or stuffy nose). 29 g 3   albuterol (VENTOLIN HFA) 108 (90 Base) MCG/ACT inhaler INHALE 2 PUFFS INTO THE LUNGS Q  4 - 6 H PRN FOR cough, wheeze, shortness of breath, chest tightness 1 each 2   levocetirizine (XYZAL) 5 MG tablet Take 1 tablet (5 mg total) by mouth every evening. 30 tablet 5   olopatadine (PATANOL) 0.1 % ophthalmic solution Place 1 drop into both eyes 2 (two) times daily. 5 mL 5   No current facility-administered medications for this visit.  Known medication allergies: Allergies  Allergen Reactions   Fish Allergy Itching and Hives   Fish Oil Itching   Other Hives    Tree nuts   Peach Flavor Hives, Other (See Comments) and Shortness Of Breath   Peanut-Containing Drug Products Hives   Pollen Extract Shortness Of Breath   Shellfish-Derived Products Anaphylaxis   Cocoa     Other reaction(s): Other (See Comments) Migraine   Gluten Meal Nausea And Vomiting    Migraine   Lac Bovis Other (See Comments)    GI upset    Imitrex [Sumatriptan]    Peanuts [Peanut Oil] Hives   Rizatriptan     Other reaction(s): Other (See Comments) Muscles tightening   Sumatriptan Succinate     Other  reaction(s): Other (See Comments) Does not remember   Topamax [Topiramate]      Physical examination: Blood pressure 118/80, pulse 87, temperature 97.7 F (36.5 C), temperature source Temporal, resp. rate 18, height 5\' 3"  (1.6 m), weight 111 lb 6.4 oz (50.5 kg), SpO2 100 %.  General: Alert, interactive, in no acute distress. HEENT: PERRLA, TMs pearly gray, turbinates non-edematous without discharge, post-pharynx non erythematous. Neck: Supple without lymphadenopathy. Lungs: Clear to auscultation without wheezing, rhonchi or rales. {no increased work of breathing. CV: Normal S1, S2 without murmurs. Abdomen: Nondistended, nontender. Skin: Warm and dry, without lesions or rashes. Extremities:  No clubbing, cyanosis or edema. Neuro:   Grossly intact.  Diagnositics/Labs: Labs:  Component     Latest Ref Rng & Units 04/19/2019  D Pteronyssinus IgE     Class II kU/L 0.61 (A)  D Farinae IgE     Class II kU/L 0.81 (A)  Cat Dander IgE     Class 0 kU/L <0.10  Dog Dander IgE     Class I kU/L 0.39 (A)  [place] Grass IgE     Class IV kU/L 9.94 (A)  Timothy Grass IgE     Class V kU/L 54.00 (A)  [place] Grass IgE     Class V kU/L 20.10 (A)  [place] Grass IgE     Class V kU/L 37.80 (A)  Cockroach, American IgE     Class 0 kU/L <0.10  Penicillium Chrysogen IgE     Class 0 kU/L <0.10  Cladosporium Herbarum IgE     Class III kU/L 2.14 (A)  Aspergillus Fumigatus IgE     Class 0 kU/L <0.10  Mucor Racemosus IgE     Class 0 kU/L <0.10  Alternaria Alternata IgE     Class 0 kU/L <0.10  Stemphylium Herbarum IgE     Class 0 kU/L <0.10  Common Silver Wendee Copp IgE     Class I kU/L 0.49 (A)  Oak, White IgE     Class III kU/L 1.44 (A)  Elm, American IgE     Class 0/I kU/L 0.13 (A)  Maple/Box Elder IgE     Class 0/I kU/L 0.14 (A)  Hickory, White IgE     Class II kU/L 0.75 (A)  Amer Sycamore IgE Qn     Class 0/I kU/L 0.13 (A)  White Mulberry IgE     Class 0 kU/L <0.10  Sweet gum IgE RAST  Ql     Class 0/I kU/L 0.23 (A)  Cedar, Mountain IgE     Class 0/I kU/L 0.14 (A)  Ragweed, Short IgE     Class 0/I kU/L 0.14 (A)  Mugwort IgE Qn     Class 0/I kU/L 0.12 (A)  Plantain, English IgE  Class 0/I kU/L 0.29 (A)  Pigweed, Rough IgE     Class 0 kU/L <0.10  Sheep Sorrel IgE Qn     Class 0/I kU/L 0.25 (A)  Nettle IgE     Class 0/I kU/L 0.15 (A)  Sesame Seed IgE     Class 0/I kU/L 0.11 (A)  Peanut IgE     Class 0 kU/L <0.10  Hazelnut ([place]) IgE     Class I kU/L 0.50 (A)  F020-IgE Almond     Class 0 kU/L <0.10  Allergen Coconut IgE     Class 0 kU/L <0.10  Pecan Nut IgE     Class 0/I kU/L 0.14 (A)  F202-IgE Cashew Nut     Class II kU/L 1.38 (A)  Codfish IgE     Class 0 kU/L <0.10  Halibut IgE     Class 0 kU/L <0.10  Allergen Walley Pike IgE     Class 0 kU/L <0.10  Tuna     Class 0 kU/L <0.10  Allergen Salmon IgE     Class 0 kU/L <0.10  Allergen Mackerel IgE     Class 0 kU/L <0.10  Allergen Trout IgE     Class 0 kU/L <0.10  Clam IgE     Class 0 kU/L <0.10  F023-IgE Crab     Class 0 kU/L <0.10  Shrimp IgE     Class 0 kU/L <0.10  Scallop IgE     Class 0 kU/L <0.10  F290-IgE Oyster     Class 0 kU/L <0.10  F080-IgE Lobster     Class 0 kU/L <0.10  Pomegranate IgE     <0.35 kU/L <0.35  Class Interpretation      0  [place] Nut IgE     Class 0 kU/L <0.10  Walnut IgE     Class 0/I kU/L 0.27 (A)  Allergen Blueberry IgE     Class 0 kU/L <0.10  F002-IgE Milk     Class I kU/L 0.33 (A)  F078-IgE Casein     Class 0 kU/L <0.10  Allergen Strawberry IgE     Class 0 kU/L <0.10  Allergen, Peach f95     Class 0/I kU/L 0.14 (A)  Allergen Grape IgE     Class 0 kU/L <0.10  Chocolate/Cacao IgE     Class 0 kU/L <0.10  Kiwi Fruit     Class 0/I kU/L 0.11 (A)  Pineapple IgE     Class 0/I kU/L 0.18 (A)  Mango IgE     Class 0 kU/L <0.10  Allergen Banana IgE     Class 0/I kU/L 0.16 (A)  specimen status report      Comment    Spirometry: FEV1: 2.82 L 107  %, FVC: 3.47 L 111%, ratio consistent with nonobstructive pattern  Allergy testing: Food allergy skin prick testing is positive to sesame, milk, pistachio, hops, pork, navy bean, Karaya gum Allergy testing results were read and interpreted by provider, documented by clinical staff.   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  All of these levels were very low except for the cashew. -I do believe you have a component of oral allergy syndrome with fresh or raw  fruits related to your pollen allergy (see below) -We discussed today there could be an irritant effect like strong odors or noxious odors can irritate the airway leading to symptoms there could be a role here of airborne food particles that may be inhaled irritating the airway -We also discussed there could be a component of stress or anxiety surrounding food ingestion that can also be a driver of hives -I do recommend that we perform some food challenges to foods you are avoiding that you have both negative skin and blood testing to -Have access to self-injectable epinephrine (Epipen or AuviQ) 0.3mg  at all times -Follow emergency action plan in case of allergic reaction  -We have discussed the following in regards to foods:   Allergy: food allergy is when you have eaten a food, developed an allergic reaction after eating the food and have IgE to the food (positive food testing either by skin testing or blood testing).  Food allergy could lead to life threatening symptoms  Sensitivity: occurs when you have IgE to a food (positive food testing either by skin testing or blood testing) but is a food you  eat without any issues.  This is not an allergy and we recommend keeping the food in the diet  Intolerance: this is when you have negative testing by either skin testing or blood testing thus not allergic but the food causes symptoms (like belly pain, bloating, diarrhea etc) with ingestion.  These foods should be avoided to prevent symptoms.     The oral allergy syndrome (OAS) or pollen-food allergy syndrome (PFAS) is a relatively common form of food allergy, particularly in adults. It typically occurs in people who have pollen allergies when the immune system "sees" proteins on the food that look like proteins on the pollen. This results in the allergy antibody (IgE) binding to the food instead of the pollen. Patients typically report itching and/or mild swelling of the mouth and throat immediately following ingestion of certain uncooked fruits (including nuts) or raw vegetables. Only a very small number of affected individuals experience systemic allergic reactions, such as anaphylaxis which occurs with true food allergies.    Asthma -Lung function testing today is normal -Have access to albuterol inhaler 2 puffs every 4-6 hours as needed for cough/wheeze/shortness of breath/chest tightness.  May use 15-20 minutes prior to activity.   Monitor frequency of use.     Allergic rhinitis -Environmental allergy panel via blood work from March 2021 is positive to dust mite, dog dander, grass pollen, tree pollen, weed pollen, ragweed pollen, and mold.  Continue avoidance measures for these allergens. -Continue Xyzal 5 mg once a day as needed for a runny nose -We will try Ryaltris 2 sprays each nostril twice a day as needed for runny or stuffy nose.  This is a combination spray with mometasone (nasal steroid for congestion control) and olopatadine (nasal antihistamine for drainage control).  This goes to a specialty pharmacy.   -Hold Flonase while trying Ryaltris -Consider saline nasal rinses as needed for  nasal symptoms. Use this before any medicated nasal sprays for best result  Allergic conjunctivitis -Use Pataday one drop in each eye once a day as needed for red, itchy eyes   Follow up in 6 months or sooner if needed.  I appreciate the opportunity to take part in Theresa Cruz's care. Please do not hesitate to contact me with questions.  Sincerely,   Prudy Feeler, MD Allergy/Immunology [facility]

## 2020-10-21 NOTE — Patient Instructions (Addendum)
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  All of these levels were very low except for the cashew. -I do believe you have a component of oral allergy syndrome with fresh or raw fruits related to your pollen allergy (see below) -We discussed today there could be an irritant effect like strong odors or noxious odors can irritate the airway leading to symptoms there could be a role here of airborne food particles that may be inhaled irritating the airway -We also discussed there could be a component of stress or anxiety surrounding food ingestion that can also be a driver of hives -I do recommend that we perform some food challenges to foods you are avoiding that you have both negative skin and blood testing to -Have access to self-injectable epinephrine (Epipen or AuviQ) 0.3mg  at all times -Follow emergency action plan in case of allergic reaction  -We have discussed the following in regards to foods:   Allergy: food allergy is when you have eaten a food, developed an allergic reaction after eating the food and have IgE to the food (positive food testing either by skin testing or blood testing).  Food allergy could lead to life threatening symptoms  Sensitivity: occurs when you have IgE to a food (positive food testing either by skin testing or blood testing) but is a food you eat without any issues.  This is not an allergy and we recommend keeping the food in the diet  Intolerance: this is when you have negative testing by either skin testing or blood testing thus not allergic but the food causes symptoms (like belly pain, bloating, diarrhea etc) with ingestion.  These foods should be  avoided to prevent symptoms.     Asthma -Lung function testing today is normal -Have access to albuterol inhaler 2 puffs every 4-6 hours as needed for cough/wheeze/shortness of breath/chest tightness.  May use 15-20 minutes prior to activity.   Monitor frequency of use.     Allergic rhinitis -Environmental allergy panel via blood work from March 2021 is positive to dust mite, dog dander, grass pollen, tree pollen, weed pollen, ragweed pollen, and mold.  Continue avoidance measures for these allergens. -Continue Xyzal 5 mg once a day as needed for a runny nose -We will try Ryaltris 2 sprays each nostril twice a day as needed for runny or stuffy nose.  This is a combination spray with mometasone (nasal steroid for congestion control) and olopatadine (nasal antihistamine for drainage control).  This goes to a specialty pharmacy.   -Hold Flonase while trying Ryaltris -Consider saline nasal rinses as needed for nasal symptoms. Use this before any medicated nasal sprays for best result  Allergic conjunctivitis -Use Pataday one drop in each eye once a day as needed for red, itchy eyes   Follow up in 6 months or sooner if needed.   The oral allergy syndrome (OAS) or pollen-food allergy syndrome (PFAS) is a relatively common form of food allergy, particularly in adults. It typically occurs in people who have pollen allergies when the immune system "sees" proteins on the food that look like proteins on the pollen. This results in the allergy antibody (IgE) binding to the food instead of the pollen. Patients typically report itching and/or  mild swelling of the mouth and throat immediately following ingestion of certain uncooked fruits (including nuts) or raw vegetables. Only a very small number of affected individuals experience systemic allergic reactions, such as anaphylaxis which occurs with true food allergies.

## 2020-11-04 ENCOUNTER — Telehealth: Payer: Self-pay | Admitting: Allergy

## 2020-11-04 MED ORDER — FLUTICASONE PROPIONATE 50 MCG/ACT NA SUSP: 2.0000 | Freq: Every day | NASAL | 5 refills | Status: DC | PRN

## 2020-11-04 NOTE — Telephone Encounter (Signed)
Patient was seen in [facility] on 10/21/20. She had some prescriptions sent in, but states she also asked the nurse to send in some Flonase also. She is requesting for Flonase to be sent to the [facility] in [place].

## 2020-11-04 NOTE — Telephone Encounter (Signed)
Sent in flonase rx to [facility] in [place]

## 2020-11-10 DIAGNOSIS — F419 Anxiety disorder, unspecified: Secondary | ICD-10-CM | POA: Insufficient documentation

## 2020-12-14 DIAGNOSIS — D696 Thrombocytopenia, unspecified: Secondary | ICD-10-CM | POA: Insufficient documentation

## 2020-12-30 ENCOUNTER — Encounter: Admitting: Allergy

## 2021-01-13 ENCOUNTER — Ambulatory Visit: Admitting: Allergy

## 2021-03-02 ENCOUNTER — Ambulatory Visit: Admit: 2021-03-02 | Discharge status: Absent without leave

## 2021-04-29 DIAGNOSIS — E611 Iron deficiency: Secondary | ICD-10-CM | POA: Insufficient documentation

## 2021-06-20 ENCOUNTER — Other Ambulatory Visit: Payer: Self-pay

## 2021-06-20 DIAGNOSIS — H53149 Visual discomfort, unspecified: Secondary | ICD-10-CM | POA: Diagnosis not present

## 2021-06-20 DIAGNOSIS — R110 Nausea: Secondary | ICD-10-CM | POA: Diagnosis not present

## 2021-06-20 DIAGNOSIS — R519 Headache, unspecified: Secondary | ICD-10-CM | POA: Insufficient documentation

## 2021-06-20 DIAGNOSIS — Z91010 Allergy to peanuts: Secondary | ICD-10-CM | POA: Diagnosis not present

## 2021-06-20 NOTE — ED Triage Notes (Signed)
Headache that began today around 2PM. Tried to sleep it off.   About an hour ago pain intensified and a "pressure" sensation began. Sts she has a hx of recurrent migraines and it doesn't not feel like her normal ones.   Not taking any preventative meds due to allergies.

## 2021-06-21 ENCOUNTER — Emergency Department (HOSPITAL_BASED_OUTPATIENT_CLINIC_OR_DEPARTMENT_OTHER)
Admission: EM | Admit: 2021-06-21 | Discharge: 2021-06-21 | Disposition: A | Discharge status: Home / Self Care | Attending: Emergency Medicine | Admitting: Emergency Medicine

## 2021-06-21 ENCOUNTER — Emergency Department (HOSPITAL_BASED_OUTPATIENT_CLINIC_OR_DEPARTMENT_OTHER)

## 2021-06-21 ENCOUNTER — Encounter (HOSPITAL_BASED_OUTPATIENT_CLINIC_OR_DEPARTMENT_OTHER): Payer: Self-pay

## 2021-06-21 DIAGNOSIS — R519 Headache, unspecified: Secondary | ICD-10-CM

## 2021-06-21 MED ORDER — KETOROLAC TROMETHAMINE 30 MG/ML IJ SOLN
30.0000 mg | Freq: Once | INTRAMUSCULAR | Status: AC
  Administered 2021-06-21: 30 mg via INTRAVENOUS
  Filled 2021-06-21: qty 1

## 2021-06-21 MED ORDER — PROCHLORPERAZINE EDISYLATE 10 MG/2ML IJ SOLN
10.0000 mg | Freq: Once | INTRAMUSCULAR | Status: AC
  Administered 2021-06-21: 10 mg via INTRAVENOUS
  Filled 2021-06-21: qty 2

## 2021-06-21 NOTE — ED Provider Notes (Signed)
[facility]  Provider Note  CSN: [id number] Arrival date & time: 06/20/21 2348  History Chief Complaint  Patient presents with   Headache    Theresa Cruz is a 36 y.o. female with history of frequent headaches reports increased pain and frequency the last month. Described as frontal and posterior pressure. These features are similar to those described at neurology visit in Nov 2022, but patient states it feels different and she is worried about a brain tumor because her father died from a brain tumor. She has had photophobia and nausea. She is taking Nurtec at home with some improvement.    Home Medications Prior to Admission medications   Medication Sig Start Date End Date Taking? Authorizing Provider  albuterol (VENTOLIN HFA) 108 (90 Base) MCG/ACT inhaler INHALE 2 PUFFS INTO THE LUNGS Q  4 - 6 H PRN FOR cough, wheeze, shortness of breath, chest tightness 10/21/20   Kennith Gain, MD  EPINEPHrine 0.3 mg/0.3 mL IJ SOAJ injection Inject 0.3 mg into the muscle as needed for anaphylaxis. 10/21/20   Kennith Gain, MD  fluticasone (FLONASE) 50 MCG/ACT nasal spray Place 2 sprays into both nostrils daily as needed for allergies or rhinitis. 11/04/20   Kennith Gain, MD  levocetirizine (XYZAL) 5 MG tablet Take 1 tablet (5 mg total) by mouth every evening. 10/21/20   Kennith Gain, MD  olopatadine (PATANOL) 0.1 % ophthalmic solution Place 1 drop into both eyes 2 (two) times daily. 10/21/20   Kennith Gain, MD  Olopatadine-Mometasone Rennie Plowman) [phone] MCG/ACT SUSP Place 2 sprays into the nose 2 (two) times daily as needed (Runny or stuffy nose). 10/21/20   Kennith Gain, MD     Allergies    Ferric carboxymaltose, Fish allergy, Fish oil, Other, Peach flavor, Peanut oil, Peanut-containing drug products, Pollen extract, Shellfish-derived products, Cocoa, Gluten meal, [place], Imitrex [sumatriptan],  Rizatriptan, Sumatriptan succinate, Topamax [topiramate], and Eletriptan hydrobromide   Review of Systems   Review of Systems Please see HPI for pertinent positives and negatives  Physical Exam BP 111/74   Pulse 63   Temp 97.8 F (36.6 C) (Oral)   Resp 18   Ht '5\' 3"'$  (1.6 m)   Wt 50.8 kg   LMP 05/28/2021 (Approximate)   SpO2 98%   BMI 19.84 kg/m   Physical Exam Vitals and nursing note reviewed.  Constitutional:      Appearance: Normal appearance.  HENT:     Head: Normocephalic and atraumatic.     Nose: Nose normal.     Mouth/Throat:     Mouth: Mucous membranes are moist.  Eyes:     Extraocular Movements: Extraocular movements intact.     Conjunctiva/sclera: Conjunctivae normal.  Cardiovascular:     Rate and Rhythm: Normal rate.  Pulmonary:     Effort: Pulmonary effort is normal.     Breath sounds: Normal breath sounds.  Abdominal:     General: Abdomen is flat.     Palpations: Abdomen is soft.     Tenderness: There is no abdominal tenderness.  Musculoskeletal:        General: No swelling. Normal range of motion.     Cervical back: Neck supple.  Skin:    General: Skin is warm and dry.  Neurological:     General: No focal deficit present.     Mental Status: She is alert and oriented to person, place, and time.     Cranial Nerves: No cranial nerve deficit.  Sensory: No sensory deficit.     Motor: No weakness.     Gait: Gait normal.  Psychiatric:        Mood and Affect: Mood normal.    ED Results / Procedures / Treatments   EKG None  Procedures Procedures  Medications Ordered in the ED Medications  ketorolac (TORADOL) 30 MG/ML injection 30 mg (30 mg Intravenous Given 06/21/21 0045)  prochlorperazine (COMPAZINE) injection 10 mg (10 mg Intravenous Given 06/21/21 0046)    Initial Impression and Plan  Patient here with exacerbation of chronic headaches, she is concerned about a brain tumor. I explained that this is unlikely and that there are risks  involved in CT scanning her head. She is insistent and willing to take that risk. Toradol and Compazine ordered for symptom improvement.   ED Course   Clinical Course as of 06/21/21 0119  Mon Jun 21, 2021  0053 I personally viewed the images from radiology studies and agree with radiologist interpretation: CT is normal.   [CS]  0118 Patient feeling better after meds. Reassured head CT is normal. Recommend follow up with her Headache clinic for further management. [CS]    Clinical Course User Index [CS] Truddie Hidden, MD     MDM Rules/Calculators/A&P Medical Decision Making Amount and/or Complexity of Data Reviewed Radiology: ordered and independent interpretation performed. Decision-making details documented in ED Course.  Risk Prescription drug management.    Final Clinical Impression(s) / ED Diagnoses Final diagnoses:  Nonintractable episodic headache, unspecified headache type    Rx / DC Orders ED Discharge Orders     None        Truddie Hidden, MD 06/21/21 [phone]

## 2021-07-08 DIAGNOSIS — L820 Inflamed seborrheic keratosis: Secondary | ICD-10-CM | POA: Insufficient documentation

## 2021-07-08 DIAGNOSIS — L812 Freckles: Secondary | ICD-10-CM | POA: Insufficient documentation

## 2021-07-20 ENCOUNTER — Encounter: Attending: Physician Assistant | Admitting: Skilled Nursing Facility1

## 2021-07-20 NOTE — Progress Notes (Signed)
Pt requested to work with Christena Deem, appt never occurred.

## 2021-08-25 ENCOUNTER — Ambulatory Visit: Admitting: Registered"

## 2021-09-30 ENCOUNTER — Ambulatory Visit (INDEPENDENT_AMBULATORY_CARE_PROVIDER_SITE_OTHER): Admitting: Family Medicine

## 2021-09-30 ENCOUNTER — Encounter: Payer: Self-pay | Admitting: Family Medicine

## 2021-09-30 VITALS — BP 122/85 | Ht 63.0 in | Wt 115.0 lb

## 2021-09-30 DIAGNOSIS — D508 Other iron deficiency anemias: Secondary | ICD-10-CM | POA: Diagnosis not present

## 2021-09-30 DIAGNOSIS — M357 Hypermobility syndrome: Secondary | ICD-10-CM | POA: Insufficient documentation

## 2021-09-30 DIAGNOSIS — R011 Cardiac murmur, unspecified: Secondary | ICD-10-CM

## 2021-09-30 DIAGNOSIS — Q7962 Hypermobile Ehlers-Danlos syndrome: Secondary | ICD-10-CM | POA: Diagnosis not present

## 2021-09-30 DIAGNOSIS — D649 Anemia, unspecified: Secondary | ICD-10-CM | POA: Insufficient documentation

## 2021-09-30 HISTORY — DX: Hypermobility syndrome: M35.7

## 2021-09-30 NOTE — Patient Instructions (Signed)
Nice to meet you Please check out body braid  Please see if body helix may help with compression  Theresa Cruz has more information  I have placed a referral to Hematology  I have ordered an ECHo  Please send me a message in MyChart with any questions or updates.  Please see me back as needed.   --Dr. Raeford Razor

## 2021-09-30 NOTE — Assessment & Plan Note (Signed)
Her ferritin was found to be low and had allergic reaction to infusion. -Counseled supportive care. -referral to Hematology.

## 2021-09-30 NOTE — Assessment & Plan Note (Signed)
Beighton score 7/9. Has different pains that have been worked up by multiple specialties with normal imaging and lab work.  -Counseled on exercise therapy and supportive care. -Echo. - counseled on compression  - could consider PT

## 2021-09-30 NOTE — Progress Notes (Signed)
  Theresa Cruz - 36 y.o. female MRN [id number]  Date of birth: [birth year]  SUBJECTIVE:  Including CC & ROS.  No chief complaint on file.   Theresa Cruz is a 36 y.o. female that is  here for concerns in regards to hypermobility. She is training for her PT test in the [employer]. She's had trouble with completing the test. Was found to have low ferritin but had an allergic reaction to iron infusion. No history of surgery.   Review of Systems See HPI   HISTORY: Past Medical, Surgical, Social, and Family History Reviewed & Updated per EMR.   Pertinent Historical Findings include:  Past Medical History:  Diagnosis Date   Allergy    Asthma    Urticaria     History reviewed. No pertinent surgical history.   PHYSICAL EXAM:  VS: BP 122/85 (BP Location: Left Arm, Patient Position: Sitting)   Ht '5\' 3"'$  (1.6 m)   Wt 115 lb (52.2 kg)   BMI 20.37 kg/m  Physical Exam Gen: NAD, alert, cooperative with exam, well-appearing MSK:  Neurovascularly intact       ASSESSMENT & PLAN:   I spent 45 minutes with this patient, greater than 50% was face-to-face time counseling regarding the below diagnosis.   Ehlers-Danlos syndrome type III Beighton score 7/9. Has different pains that have been worked up by multiple specialties with normal imaging and lab work.  -Counseled on exercise therapy and supportive care. -Echo. - counseled on compression  - could consider PT   Absolute anemia Her ferritin was found to be low and had allergic reaction to infusion. -Counseled supportive care. -referral to Hematology.

## 2021-10-01 ENCOUNTER — Encounter: Payer: Self-pay | Admitting: Allergy

## 2021-10-01 ENCOUNTER — Ambulatory Visit: Admitting: Allergy

## 2021-10-01 VITALS — BP 110/70 | HR 64 | Temp 98.3°F | Resp 18 | Ht 63.4 in | Wt 115.3 lb

## 2021-10-01 DIAGNOSIS — T7840XD Allergy, unspecified, subsequent encounter: Secondary | ICD-10-CM

## 2021-10-01 DIAGNOSIS — T7800XA Anaphylactic reaction due to unspecified food, initial encounter: Secondary | ICD-10-CM

## 2021-10-01 DIAGNOSIS — J3089 Other allergic rhinitis: Secondary | ICD-10-CM | POA: Diagnosis not present

## 2021-10-01 DIAGNOSIS — J4520 Mild intermittent asthma, uncomplicated: Secondary | ICD-10-CM | POA: Diagnosis not present

## 2021-10-01 DIAGNOSIS — T781XXD Other adverse food reactions, not elsewhere classified, subsequent encounter: Secondary | ICD-10-CM

## 2021-10-01 DIAGNOSIS — H1013 Acute atopic conjunctivitis, bilateral: Secondary | ICD-10-CM

## 2021-10-01 DIAGNOSIS — T50905D Adverse effect of unspecified drugs, medicaments and biological substances, subsequent encounter: Secondary | ICD-10-CM

## 2021-10-01 MED ORDER — LEVOCETIRIZINE DIHYDROCHLORIDE 5 MG PO TABS: 5.0000 mg | ORAL_TABLET | Freq: Every evening | ORAL | 5 refills | Status: DC

## 2021-10-01 MED ORDER — EPINEPHRINE 0.3 MG/0.3ML IJ SOAJ
0.3000 mg | INTRAMUSCULAR | 2 refills | Status: DC | PRN
Start: 2021-10-01 — End: 2022-06-11

## 2021-10-01 MED ORDER — OLOPATADINE HCL 0.1 % OP SOLN: 1.0000 [drp] | Freq: Two times a day (BID) | OPHTHALMIC | 5 refills | Status: DC

## 2021-10-01 MED ORDER — ALBUTEROL SULFATE HFA 108 (90 BASE) MCG/ACT IN AERS: 2.0000 | INHALATION_SPRAY | RESPIRATORY_TRACT | 1 refills | Status: DC | PRN

## 2021-10-01 NOTE — Progress Notes (Signed)
Follow-up Note  RE: Theresa Cruz MRN: [id number] DOB: 1985/09/22 Date of Office Visit: 10/01/2021   History of present illness: Theresa Cruz is a 36 y.o. female presenting today for follow-up of food allergy, oral allergy syndrome, asthma, allergic rhinitis with conjunctivitis.  She was last seen in the office on 10/21/20 by myself.   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  She states all of her life she has been more flexible and has had issues with her joints and more injuries and muscle aches.  For example she states she woke up this morning and her upper back was hurting but she has not done anything for these muscles to be hurting.  She states she started getting back into running but muscle injuries made her stop running.   She states the other day she had a itchy throat and felt like she was having trouble swallowing but hadn't eaten anything or known triggers of symptoms.  She states she can have random red blotchy areas of her skin.  She can have flushing.   Since getting diagnosed with hypermobility syndrome she did find a site that also had MCAS listed and in looking at this she realized she has similar symptoms/reactions as what was listed.  She would like to have testing for MCAS.   She states she had a reaction earlier this year during an iron infusion and she required treatment in the ED.  Symtpoms included fatigue, chest tightness and pressure, shortness of breath, abdominal cramping, nausea and vomiting no more than a minute or 2 after starting the infusion.  She received ferric carboxymaltose.  She had an EKG that did not show any acute ischemic changes.  Chest x-ray was unremarkable.  She did receive Benadryl prior to going to the ED as well as steroids.  The time she arrived to the ED she was essentially asymptomatic that she did not require any additional treatment other than monitoring and IV fluids. After this incident she  states her hematologist recommended going back to the oral treatment which had not been working to improve her iron levels.  That she will be seeking a second opinion. The sports medicine provider has recommended her to have an EDS specialist however she states there are 2 in the area and they both have a very long wait list.  She does have access to an epinephrine device although needs to be refilled.  She also has access to an albuterol inhaler which also has been refilled.  She has not needed to use her albuterol inhaler in quite some time.  She is taking levocetirizine as needed but plans to start taking it on a daily basis.    Review of systems: Review of Systems  Constitutional: Negative.   HENT:         See HPI  Eyes: Negative.   Respiratory: Negative.    Cardiovascular: Negative.   Gastrointestinal: Negative.   Musculoskeletal:        See HPI  Skin: Negative.   Allergic/Immunologic: Negative.   Neurological: Negative.      All other systems negative unless noted above in HPI  Past medical/social/surgical/family history have been reviewed and are unchanged unless specifically indicated below.  No changes  Medication List: Current Outpatient Medications  Medication Sig Dispense Refill   EPINEPHrine 0.3 mg/0.3 mL IJ SOAJ injection Inject 0.3 mg into the muscle as needed for anaphylaxis. 1 each 2   fluticasone (FLONASE) 50 MCG/ACT nasal spray  Place 2 sprays into both nostrils daily as needed for allergies or rhinitis. 16 g 5   levocetirizine (XYZAL) 5 MG tablet Take 1 tablet (5 mg total) by mouth every evening. 30 tablet 5   olopatadine (PATANOL) 0.1 % ophthalmic solution Place 1 drop into both eyes 2 (two) times daily. 5 mL 5   triamcinolone cream (KENALOG) 0.1 % Apply 1 Application topically 2 (two) times daily.     No current facility-administered medications for this visit.     Known medication allergies: Allergies  Allergen Reactions   Ferric Carboxymaltose  Anaphylaxis, Other (See Comments), Shortness Of Breath and Nausea And Vomiting   Fish Allergy Itching and Hives   Fish Oil Itching   Other Hives    Tree nuts   Peach Flavor Hives, Other (See Comments) and Shortness Of Breath   Peanut Oil Hives and Itching   Peanut-Containing Drug Products Hives and Other (See Comments)   Pollen Extract Shortness Of Breath   Shellfish-Derived Products Anaphylaxis   Cocoa     Other reaction(s): Other (See Comments) Migraine   Gluten Meal Nausea And Vomiting    Migraine   Milk (Cow) Other (See Comments)    GI upset    Eletriptan Other (See Comments)   Imitrex [Sumatriptan]    Rizatriptan     Other reaction(s): Other (See Comments) Muscles tightening   Shellfish Allergy Other (See Comments)   Sumatriptan Succinate     Other reaction(s): Other (See Comments) Does not remember   Topamax [Topiramate]    Eletriptan Hydrobromide Other (See Comments)     Physical examination: Blood pressure 110/70, pulse 64, temperature 98.3 F (36.8 C), temperature source Temporal, resp. rate 18, height 5' 3.4" (1.61 m), weight 115 lb 4.8 oz (52.3 kg), SpO2 100 %.  General: Alert, interactive, in no acute distress. HEENT: PERRLA, TMs pearly gray, turbinates non-edematous without discharge, post-pharynx non erythematous. Neck: Supple without lymphadenopathy. Lungs: Clear to auscultation without wheezing, rhonchi or rales. {no increased work of breathing. CV: Normal S1, S2 without murmurs. Abdomen: Nondistended, nontender. Skin: Warm and dry, without lesions or rashes. Extremities:  No clubbing, cyanosis or edema. Neuro:   Grossly intact.  Diagnositics/Labs:none today  Assessment and plan:   ?MCAS -Newly diagnosed with hypermobility syndrome -You do have IgE mediated allergy issues (as below) however also experience reaction type symptoms despite any known triggers -Will screen for mastocytosis with tryptase level.  If elevated will need to repeat and if  remains elevate would recommend further testing for mastocytosis.  Your hematologist would also be involved in this diagnosis.   -If screening test are negative then you could have MCAS which is a diagnosis of exclusion -Would take your levocetrizine daily at this time -Have access to epinephrine device in case of reaction -Will provide with a lab order for tryptase to present to ED if you have reaction to draw at time of reaction symptoms.  Tryptase is best drawn within 2-4 hours window of onset of reaction symptoms  Adverse effect of medication -Reaction with iron infusion -Would not use that product again.  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  All of these levels  were very low except for the cashew.  -I do believe you have a component of oral allergy syndrome with fresh or raw fruits related to your pollen allergy (see below) -There could be an irritant effect (like strong odors or noxious odors) can irritate the airway leading to symptoms there could be a role here of airborne food particles that may be inhaled irritating the airway -We can perform food challenges to foods you are avoiding that you have both negative skin and blood testing to -Have access to self-injectable epinephrine (Epipen or AuviQ) 0.'3mg'$  at all times -Follow emergency action plan in case of allergic reaction  -We have discussed the following in regards to foods:   Allergy: food allergy is when you have eaten a food, developed an allergic reaction after eating the food and have IgE to the food (positive food  testing either by skin testing or blood testing).  Food allergy could lead to life threatening symptoms  Sensitivity: occurs when you have IgE to a food (positive food testing either by skin testing or blood testing) but is a food you eat without any issues.  This is not an allergy and we recommend keeping the food in the diet  Intolerance: this is when you have negative testing by either skin testing or blood testing thus not allergic but the food causes symptoms (like belly pain, bloating, diarrhea etc) with ingestion.  These foods should be avoided to prevent symptoms.    Asthma -Have access to albuterol inhaler 2 puffs every 4-6 hours as needed for cough/wheeze/shortness of breath/chest tightness.  May use 15-20 minutes prior to activity.   Monitor frequency of use.    Allergic rhinitis -Continue avoidance measures for dust mite, dog dander, grass pollen, tree pollen, weed pollen, ragweed pollen, and mold.  -Continue Xyzal 5 mg once a day as needed for general allergy symptom control -Use Ryaltris 2 sprays each nostril twice a day as needed for runny or stuffy nose.  This is a combination spray with mometasone (nasal steroid for congestion control) and olopatadine (nasal antihistamine for drainage control).  This goes to a specialty pharmacy.  Sample provided today.  -Consider saline nasal rinses as needed for nasal symptoms. Use this before any medicated nasal sprays for best result  Allergic conjunctivitis -Use Pataday one drop in each eye once a day as needed for red, itchy eyes   Follow up in 6 months or sooner if needed.  I appreciate the opportunity to take part in Keeli's care. Please do not hesitate to contact me with questions.  Sincerely,   Prudy Feeler, MD Allergy/Immunology [facility]

## 2021-10-01 NOTE — Patient Instructions (Addendum)
?MCAS -Newly diagnosed with hypermobility syndrome -You do have IgE mediated allergy issues (as below) however also experience reaction type symptoms despite any known triggers -Will screen for mastocytosis with tryptase level.  If elevated will need to repeat and if remains elevate would recommend further testing for mastocytosis.  Your hematologist would also be involved in this diagnosis.   -If screening test are negative then you could have MCAS which is a diagnosis of exclusion -Would take your levocetrizine daily at this time -Have access to epinephrine device in case of reaction -Will provide with a lab order for tryptase to present to ED if you have reaction to draw at time of reaction symptoms.  Tryptase is best drawn within 2-4 hours window of onset of reaction symptoms   Adverse effect of medication -Reaction with iron infusion -Would not use that product again.  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  All of these levels were very low except for the cashew.  -I do believe you have a component of oral allergy syndrome with fresh or raw fruits related to your pollen allergy (see below) -There could be an irritant effect (like strong odors or noxious odors) can irritate the airway leading to symptoms there could be a role here of airborne food particles that may be inhaled irritating the airway -We can perform food challenges to foods you are avoiding that you have both negative skin and blood testing to -Have access to self-injectable epinephrine (Epipen or AuviQ) 0.'3mg'$  at all  times -Follow emergency action plan in case of allergic reaction  -We have discussed the following in regards to foods:   Allergy: food allergy is when you have eaten a food, developed an allergic reaction after eating the food and have IgE to the food (positive food testing either by skin testing or blood testing).  Food allergy could lead to life threatening symptoms  Sensitivity: occurs when you have IgE to a food (positive food testing either by skin testing or blood testing) but is a food you eat without any issues.  This is not an allergy and we recommend keeping the food in the diet  Intolerance: this is when you have negative testing by either skin testing or blood testing thus not allergic but the food causes symptoms (like belly pain, bloating, diarrhea etc) with ingestion.  These foods should be avoided to prevent symptoms.    Asthma -Have access to albuterol inhaler 2 puffs every 4-6 hours as needed for cough/wheeze/shortness of breath/chest tightness.  May use 15-20 minutes prior to activity.   Monitor frequency of use.    Allergic rhinitis -Continue avoidance measures for dust mite, dog dander, grass pollen, tree pollen, weed pollen, ragweed pollen, and mold.  -Continue Xyzal 5 mg once a day as needed for general allergy symptom control -Use Ryaltris 2 sprays each nostril twice a day as needed for runny or stuffy nose.  This is a combination spray with mometasone (nasal steroid for congestion control) and olopatadine (nasal antihistamine for drainage control).  This goes to a specialty pharmacy.  Sample provided today.  -Consider saline nasal rinses as needed for nasal symptoms. Use this before  any medicated nasal sprays for best result  Allergic conjunctivitis -Use Pataday one drop in each eye once a day as needed for red, itchy eyes   Follow up in 6 months or sooner if needed.   The oral allergy syndrome (OAS) or pollen-food allergy syndrome (PFAS) is a relatively common form  of food allergy, particularly in adults. It typically occurs in people who have pollen allergies when the immune system "sees" proteins on the food that look like proteins on the pollen. This results in the allergy antibody (IgE) binding to the food instead of the pollen. Patients typically report itching and/or mild swelling of the mouth and throat immediately following ingestion of certain uncooked fruits (including nuts) or raw vegetables. Only a very small number of affected individuals experience systemic allergic reactions, such as anaphylaxis which occurs with true food allergies.

## 2021-10-04 ENCOUNTER — Inpatient Hospital Stay (HOSPITAL_BASED_OUTPATIENT_CLINIC_OR_DEPARTMENT_OTHER): Admitting: Family

## 2021-10-04 ENCOUNTER — Other Ambulatory Visit: Payer: Self-pay | Admitting: Family

## 2021-10-04 ENCOUNTER — Encounter: Payer: Self-pay | Admitting: Family

## 2021-10-04 ENCOUNTER — Inpatient Hospital Stay: Attending: Hematology & Oncology

## 2021-10-04 VITALS — BP 117/94 | HR 74 | Temp 98.7°F | Resp 17 | Ht 63.0 in | Wt 118.0 lb

## 2021-10-04 DIAGNOSIS — D649 Anemia, unspecified: Secondary | ICD-10-CM

## 2021-10-04 DIAGNOSIS — D509 Iron deficiency anemia, unspecified: Secondary | ICD-10-CM | POA: Insufficient documentation

## 2021-10-04 DIAGNOSIS — Z809 Family history of malignant neoplasm, unspecified: Secondary | ICD-10-CM | POA: Insufficient documentation

## 2021-10-04 DIAGNOSIS — J45909 Unspecified asthma, uncomplicated: Secondary | ICD-10-CM | POA: Diagnosis not present

## 2021-10-04 DIAGNOSIS — D500 Iron deficiency anemia secondary to blood loss (chronic): Secondary | ICD-10-CM

## 2021-10-04 DIAGNOSIS — T782XXA Anaphylactic shock, unspecified, initial encounter: Secondary | ICD-10-CM | POA: Insufficient documentation

## 2021-10-04 DIAGNOSIS — G43909 Migraine, unspecified, not intractable, without status migrainosus: Secondary | ICD-10-CM | POA: Diagnosis not present

## 2021-10-04 DIAGNOSIS — Z79899 Other long term (current) drug therapy: Secondary | ICD-10-CM | POA: Diagnosis not present

## 2021-10-04 DIAGNOSIS — D8940 Mast cell activation, unspecified: Secondary | ICD-10-CM | POA: Insufficient documentation

## 2021-10-04 DIAGNOSIS — R5383 Other fatigue: Secondary | ICD-10-CM | POA: Diagnosis not present

## 2021-10-04 DIAGNOSIS — Z86018 Personal history of other benign neoplasm: Secondary | ICD-10-CM | POA: Diagnosis not present

## 2021-10-04 HISTORY — DX: Iron deficiency anemia, unspecified: D50.9

## 2021-10-04 LAB — CBC WITH DIFFERENTIAL (CANCER CENTER ONLY)
Abs Immature Granulocytes: 0.01 10*3/uL (ref 0.00–0.07)
Basophils Absolute: 0 10*3/uL (ref 0.0–0.1)
Basophils Relative: 0 %
Eosinophils Absolute: 0.1 10*3/uL (ref 0.0–0.5)
Eosinophils Relative: 2 %
HCT: 37.4 % (ref 36.0–46.0)
Hemoglobin: 12.3 g/dL (ref 12.0–15.0)
Immature Granulocytes: 0 %
Lymphocytes Relative: 36 %
Lymphs Abs: 1.4 10*3/uL (ref 0.7–4.0)
MCH: 31.1 pg (ref 26.0–34.0)
MCHC: 32.9 g/dL (ref 30.0–36.0)
MCV: 94.4 fL (ref 80.0–100.0)
Monocytes Absolute: 0.3 10*3/uL (ref 0.1–1.0)
Monocytes Relative: 8 %
Neutro Abs: 2.1 10*3/uL (ref 1.7–7.7)
Neutrophils Relative %: 54 %
Platelet Count: 140 10*3/uL — ABNORMAL LOW (ref 150–400)
RBC: 3.96 MIL/uL (ref 3.87–5.11)
RDW: 13 % (ref 11.5–15.5)
WBC Count: 3.9 10*3/uL — ABNORMAL LOW (ref 4.0–10.5)
nRBC: 0 % (ref 0.0–0.2)

## 2021-10-04 LAB — CMP (CANCER CENTER ONLY)
ALT: 9 U/L (ref 0–44)
AST: 17 U/L (ref 15–41)
Albumin: 4 g/dL (ref 3.5–5.0)
Alkaline Phosphatase: 30 U/L — ABNORMAL LOW (ref 38–126)
Anion gap: 6 (ref 5–15)
BUN: 14 mg/dL (ref 6–20)
CO2: 27 mmol/L (ref 22–32)
Calcium: 9.5 mg/dL (ref 8.9–10.3)
Chloride: 105 mmol/L (ref 98–111)
Creatinine: 0.78 mg/dL (ref 0.44–1.00)
GFR, Estimated: >60 mL/min (ref >60)
Glucose, Bld: 86 mg/dL (ref 70–99)
Potassium: 3.7 mmol/L (ref 3.5–5.1)
Sodium: 138 mmol/L (ref 135–145)
Total Bilirubin: 0.7 mg/dL (ref 0.3–1.2)
Total Protein: 7.3 g/dL (ref 6.5–8.1)

## 2021-10-04 LAB — RETICULOCYTES
Immature Retic Fract: 8.6 % (ref 2.3–15.9)
RBC.: 3.94 MIL/uL (ref 3.87–5.11)
Retic Count, Absolute: 38.2 10*3/uL (ref 19.0–186.0)
Retic Ct Pct: 1 % (ref 0.4–3.1)

## 2021-10-04 LAB — FERRITIN: Ferritin: 15 ng/mL (ref 11–307)

## 2021-10-04 LAB — SAVE SMEAR(SSMR), FOR PROVIDER SLIDE REVIEW

## 2021-10-04 LAB — LACTATE DEHYDROGENASE: LDH: 106 U/L (ref 98–192)

## 2021-10-04 LAB — IRON AND IRON BINDING CAPACITY (CC-WL,HP ONLY)
Iron: 156 ug/dL (ref 28–170)
Saturation Ratios: 42 % — ABNORMAL HIGH (ref 10.4–31.8)
TIBC: 375 ug/dL (ref 250–450)
UIBC: 219 ug/dL (ref 148–442)

## 2021-10-04 NOTE — Progress Notes (Signed)
Hematology/Oncology Consultation   Name: Theresa Cruz      MRN: [id number]    Location: Room/bed info not found  Date: 10/04/2021 Time:12:57 PM   REFERRING PHYSICIAN: Clearance Coots, MD  REASON FOR CONSULT: Other iron deficiency anemia    DIAGNOSIS: Iron deficiency anemia  HISTORY OF PRESENT ILLNESS: Theresa Cruz is a very pleasant 36 yo African American female with history of iron deficiency anemia that had not improved on an oral iron supplement.  She received her first ever dose of IV iron, Injectafer, in April with [place] Unfortunately, she had an anaphylactic reaction required them to move her to the ED for medical intervention and observation. This was quit traumatic for her.  She continues to have symptoms consistent with IDA as follows: Fatigue/no stamina, SOB with exertion, feeling cold, occasional palpitations, feels heart beat in her ears and eyes, ringing in her ears, puffiness in her hands and occasional chest discomfort lasting 15-20 seconds.  Her cycles is regular with heavy flow and abdominal cramping. She states that she has history of uterine fibroids.  No other obvious blood loss noted. No abnormal bruising, no petechiae.  She is in the [employer] and has not been able to pass her PT evaluation due to the above symptoms.  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  No known familial history of anemia.  No known personal or familial history of sickle cell.  No history of pregnancy or miscarriage.  No past surgical history.  No personal history of cancer. Her father had brain cancer and maternal grandfather had prostate.  She was recently diagnosed with EDS.  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  No history of diabetes or thyroid disease.  She has history of asthma and migraines. She treats the migraines with holistic methods.  No fever, n/v, cough, rash, abdominal pain or changes in bowel or bladder habits  at this time.  She has history of severe constipation and had a colonoscopy in 2020 which she states was negative. The constipation resolved after a few months with Metamucil and other stool softening methods. She still has intermittent abdominal bloating.   No smoking, ETOH or recreational drug use.  She has many food allergies. She states that her appetite is down and she needs to better hydrate throughout the day. Weight is stable at 118 lbs.   ROS: All other 10 point review of systems is negative.   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   Urticaria     ALLERGIES: Allergies  Allergen Reactions   Ferric Carboxymaltose Anaphylaxis, Other (See Comments), Shortness Of Breath and Nausea And Vomiting   Fish Allergy Itching and Hives   Fish Oil Itching   Other Hives    Tree nuts   Peach Flavor Hives, Other (See Comments) and Shortness Of Breath   Peanut Oil Hives and Itching   Peanut-Containing Drug Products Hives and Other (See Comments)   Pollen Extract Shortness Of Breath   Shellfish-Derived Products Anaphylaxis   Cocoa     Other reaction(s): Other (See Comments) Migraine   Gluten Meal Nausea And Vomiting    Migraine   Milk (Cow) Other (See Comments)    GI upset    Eletriptan Other (See Comments)   Imitrex [Sumatriptan]    Rizatriptan     Other reaction(s): Other (See Comments)  Muscles tightening   Shellfish Allergy Other (See Comments)   Sumatriptan Succinate     Other reaction(s): Other (See Comments) Does not remember   Topamax [Topiramate]    Eletriptan Hydrobromide Other (See Comments)      MEDICATIONS:  Current Outpatient Medications on File Prior to Visit  Medication Sig Dispense Refill   albuterol (VENTOLIN HFA) 108 (90 Base) MCG/ACT inhaler Inhale 2 puffs into the lungs every 4 (four) hours as needed for wheezing or shortness of breath. 18 g 1   EPINEPHrine 0.3 mg/0.3 mL IJ  SOAJ injection Inject 0.3 mg into the muscle as needed for anaphylaxis. 1 each 2   fluticasone (FLONASE) 50 MCG/ACT nasal spray Place 2 sprays into both nostrils daily as needed for allergies or rhinitis. 16 g 5   levocetirizine (XYZAL) 5 MG tablet Take 1 tablet (5 mg total) by mouth every evening. 30 tablet 5   olopatadine (PATANOL) 0.1 % ophthalmic solution Place 1 drop into both eyes 2 (two) times daily. 5 mL 5   triamcinolone cream (KENALOG) 0.1 % Apply 1 Application topically 2 (two) times daily.     No current facility-administered medications on file prior to visit.     PAST SURGICAL HISTORY No past surgical history on file.  FAMILY HISTORY: Family History  Problem Relation Age of Onset   Cancer Father    Cancer Maternal Grandfather    Allergic rhinitis Neg Hx    Angioedema Neg Hx    Asthma Neg Hx    Eczema Neg Hx    Immunodeficiency Neg Hx    Urticaria Neg Hx     SOCIAL HISTORY:  reports that she has never smoked. She has never used smokeless tobacco. She reports that she does not drink alcohol and does not use drugs.  PERFORMANCE STATUS: The patient's performance status is 1 - Symptomatic but completely ambulatory  PHYSICAL EXAM: Most Recent Vital Signs: Blood pressure (!) 117/94, pulse 74, temperature 98.7 F (37.1 C), temperature source Oral, resp. rate 17, height '5\' 3"'$  (1.6 m), weight 118 lb (53.5 kg), SpO2 100 %. BP (!) 117/94 (BP Location: Left Arm, Patient Position: Sitting)   Pulse 74   Temp 98.7 F (37.1 C) (Oral)   Resp 17   Ht '5\' 3"'$  (1.6 m)   Wt 118 lb (53.5 kg)   SpO2 100%   BMI 20.90 kg/m   General Appearance:    Alert, cooperative, no distress, appears stated age  Head:    Normocephalic, without obvious abnormality, atraumatic  Eyes:    PERRL, conjunctiva/corneas clear, EOM's intact, fundi    benign, both eyes  Ears:    Normal TM's and external ear canals, both ears  Nose:   Nares normal, septum midline, mucosa normal, no drainage    or sinus  tenderness  Throat:   Lips, mucosa, and tongue normal; teeth and gums normal  Neck:   Supple, symmetrical, trachea midline, no adenopathy;    thyroid:  no enlargement/tenderness/nodules; no carotid   bruit or JVD  Back:     Symmetric, no curvature, ROM normal, no CVA tenderness  Lungs:     Clear to auscultation bilaterally, respirations unlabored  Chest Wall:    No tenderness or deformity   Heart:    Regular rate and rhythm, S1 and S2 normal, no murmur, rub   or gallop  Breast Exam:    No tenderness, masses, or nipple abnormality  Abdomen:     Soft, non-tender, bowel sounds active all four quadrants,  no masses, no organomegaly  Genitalia:    Normal female without lesion, discharge or tenderness  Rectal:    Normal tone, normal prostate, no masses or tenderness;   guaiac negative stool  Extremities:   Extremities normal, atraumatic, no cyanosis or edema  Pulses:   2+ and symmetric all extremities  Skin:   Skin color, texture, turgor normal, no rashes or lesions  Lymph nodes:   Cervical, supraclavicular, and axillary nodes normal  Neurologic:   CNII-XII intact, normal strength, sensation and reflexes    throughout    LABORATORY DATA:  Results for orders placed or performed in visit on 10/04/21 (from the past 48 hour(s))  CBC with Differential (Cancer Center Only)     Status: Abnormal   Collection Time: 10/04/21 10:37 AM  Result Value Ref Range   WBC Count 3.9 (L) 4.0 - 10.5 K/uL   RBC 3.96 3.87 - 5.11 MIL/uL   Hemoglobin 12.3 12.0 - 15.0 g/dL   HCT 37.4 36.0 - 46.0 %   MCV 94.4 80.0 - 100.0 fL   MCH 31.1 26.0 - 34.0 pg   MCHC 32.9 30.0 - 36.0 g/dL   RDW 13.0 11.5 - 15.5 %   Platelet Count 140 (L) 150 - 400 K/uL   nRBC 0.0 0.0 - 0.2 %   Neutrophils Relative % 54 %   Neutro Abs 2.1 1.7 - 7.7 K/uL   Lymphocytes Relative 36 %   Lymphs Abs 1.4 0.7 - 4.0 K/uL   Monocytes Relative 8 %   Monocytes Absolute 0.3 0.1 - 1.0 K/uL   Eosinophils Relative 2 %   Eosinophils Absolute 0.1  0.0 - 0.5 K/uL   Basophils Relative 0 %   Basophils Absolute 0.0 0.0 - 0.1 K/uL   Immature Granulocytes 0 %   Abs Immature Granulocytes 0.01 0.00 - 0.07 K/uL    Comment: Performed at [facility] at [facility], [address], [place], [place] [address]  CMP ([place] only)     Status: Abnormal   Collection Time: 10/04/21 10:37 AM  Result Value Ref Range   Sodium 138 135 - 145 mmol/L   Potassium 3.7 3.5 - 5.1 mmol/L   Chloride 105 98 - 111 mmol/L   CO2 27 22 - 32 mmol/L   Glucose, Bld 86 70 - 99 mg/dL    Comment: Glucose reference range applies only to samples taken after fasting for at least 8 hours.   BUN 14 6 - 20 mg/dL   Creatinine 0.78 0.44 - 1.00 mg/dL   Calcium 9.5 8.9 - 10.3 mg/dL   Total Protein 7.3 6.5 - 8.1 g/dL   Albumin 4.0 3.5 - 5.0 g/dL   AST 17 15 - 41 U/L   ALT 9 0 - 44 U/L   Alkaline Phosphatase 30 (L) 38 - 126 U/L   Total Bilirubin 0.7 0.3 - 1.2 mg/dL   GFR, Estimated >60 >60 mL/min    Comment: (NOTE) Calculated using the CKD-EPI Creatinine Equation (2021)    Anion gap 6 5 - 15    Comment: Performed at [facility] Lab at [facility], [address], [place], [place] [address]  Save Smear for Provider Slide Review     Status: None   Collection Time: 10/04/21 10:37 AM  Result Value Ref Range   Smear Review SMEAR STAINED AND AVAILABLE FOR REVIEW     Comment: Performed at [facility] Lab at [facility],  [address] [place], [place] [address]  Reticulocytes     Status: None   Collection Time: 10/04/21 10:38 AM  Result Value Ref Range   Retic Ct Pct 1.0 0.4 - 3.1 %   RBC. 3.94 3.87 - 5.11 MIL/uL   Retic Count, Absolute 38.2 19.0 - 186.0 K/uL   Immature Retic Fract 8.6 2.3 - 15.9 %    Comment: Performed at [facility] at [facility], [address], [place], [place] [address]  Lactate dehydrogenase (LDH)     Status: None   Collection Time: 10/04/21  10:38 AM  Result Value Ref Range   LDH 106 98 - 192 U/L    Comment: Performed at [facility] Lab at [facility], [address], [place],  [address]      RADIOGRAPHY: No results found.     PATHOLOGY: None  ASSESSMENT/PLAN: Ms. Adsit is a very pleasant 36 yo African American female with history of iron deficiency anemia unresponsive to oral iron.  She had an anaphylactic reaction with Injectafer.  I spoke at length with out pharmacist Lattie Haw. The patient is interested in trying another brand of IV iron if possible.  We will pre medicate her with Iv benadryl, pepcid and solumedrol prior to another IV iron infusion. Insurance had no preferred brand so have ordered Feraheme to be given if needed.  Of note, we are out of network for the patient which she verbalized she is aware of and ok with. She also plans to contact her insurance company to discuss costs prior to making any appointments.  Iron studies are pending. Follow-up planned for in 3 months.   All questions were answered. The patient knows to call the clinic with any problems, questions or concerns. We can certainly see the patient much sooner if necessary.  The patient was discussed with Dr. Marin Olp and he is in agreement with the aforementioned.   Lottie Dawson, NP

## 2021-10-05 LAB — ERYTHROPOIETIN: Erythropoietin: 12.1 m[IU]/mL (ref 2.6–18.5)

## 2021-10-06 ENCOUNTER — Encounter: Payer: Self-pay | Admitting: Family

## 2021-10-06 LAB — SPECIMEN STATUS REPORT

## 2021-10-06 LAB — TRYPTASE: Tryptase: 6.8 ug/L (ref 2.2–13.2)

## 2021-10-08 ENCOUNTER — Telehealth: Payer: Self-pay | Admitting: Family

## 2021-10-08 ENCOUNTER — Other Ambulatory Visit: Payer: Self-pay | Admitting: Family

## 2021-10-08 DIAGNOSIS — D500 Iron deficiency anemia secondary to blood loss (chronic): Secondary | ICD-10-CM

## 2021-10-08 MED ORDER — FUSION PLUS PO CAPS: 1.0000 | ORAL_CAPSULE | Freq: Every day | ORAL | 6 refills | Status: DC

## 2021-10-08 NOTE — Telephone Encounter (Signed)
I was able to speak with Theresa Cruz and go over her recent lab work in detail. She would like to try taking Fusion plus daily. No IV iron needed at this time. We will plan to see her again in 2 months instead of 3. All questions concerns at this time addressed. Patient appreciative of call.

## 2021-10-12 ENCOUNTER — Ambulatory Visit (HOSPITAL_COMMUNITY)
Admission: RE | Admit: 2021-10-12 | Discharge: 2021-10-12 | Disposition: A | Discharge status: Home / Self Care | Source: Ambulatory Visit | Attending: Family Medicine | Admitting: Family Medicine

## 2021-10-12 DIAGNOSIS — R011 Cardiac murmur, unspecified: Secondary | ICD-10-CM | POA: Diagnosis not present

## 2021-10-12 DIAGNOSIS — Q7962 Hypermobile Ehlers-Danlos syndrome: Secondary | ICD-10-CM | POA: Diagnosis not present

## 2021-10-14 LAB — ECHOCARDIOGRAM COMPLETE
Area-P 1/2: 3.4 cm2
S' Lateral: 2.7 cm

## 2021-10-18 ENCOUNTER — Telehealth: Payer: Self-pay | Admitting: Family Medicine

## 2021-10-18 NOTE — Telephone Encounter (Signed)
Left VM for patient. If she calls back please have her speak with a nurse/CMA and inform her ECHO is normal.   If any questions then please take the best time and phone number to call and I will try to call her back.   Rosemarie Ax, MD [facility] 10/18/2021, 2:46 PM

## 2021-10-21 ENCOUNTER — Encounter: Payer: Self-pay | Admitting: Family Medicine

## 2021-10-27 ENCOUNTER — Ambulatory Visit (INDEPENDENT_AMBULATORY_CARE_PROVIDER_SITE_OTHER): Admitting: Family Medicine

## 2021-10-27 ENCOUNTER — Encounter: Payer: Self-pay | Admitting: Family Medicine

## 2021-10-27 DIAGNOSIS — M357 Hypermobility syndrome: Secondary | ICD-10-CM | POA: Diagnosis not present

## 2021-10-27 NOTE — Progress Notes (Signed)
  Theresa Cruz - 36 y.o. female MRN [id number]  Date of birth: [birth year]  SUBJECTIVE:  Including CC & ROS.  No chief complaint on file.   Theresa Cruz is a 36 y.o. 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   Review of Systems See HPI   HISTORY: Past Medical, Surgical, Social, and Family History Reviewed & Updated per EMR.   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 No pertinent surgical history.   PHYSICAL EXAM:  VS: BP 126/77 (BP Location: Left Arm, Patient Position: Sitting)   Ht '5\' 3"'$  (1.6 m)   Wt 118 lb (53.5 kg)   BMI 20.90 kg/m  Physical Exam Gen: NAD, alert, cooperative with exam, well-appearing MSK:  Neurovascularly intact       ASSESSMENT & PLAN:   Hypermobility syndrome Presenting with paperwork to be completed for the [employer].  She cannot complete the training test required. -Counseled on home exercise therapy and supportive care. -completed paperwork.

## 2021-10-27 NOTE — Assessment & Plan Note (Signed)
Presenting with paperwork to be completed for the [employer].  She cannot complete the training test required. -Counseled on home exercise therapy and supportive care. -completed paperwork.

## 2021-12-08 DIAGNOSIS — F411 Generalized anxiety disorder: Secondary | ICD-10-CM | POA: Insufficient documentation

## 2021-12-08 DIAGNOSIS — F429 Obsessive-compulsive disorder, unspecified: Secondary | ICD-10-CM | POA: Insufficient documentation

## 2021-12-08 HISTORY — DX: Obsessive-compulsive disorder, unspecified: F42.9

## 2021-12-08 HISTORY — DX: Generalized anxiety disorder: F41.1

## 2021-12-10 ENCOUNTER — Other Ambulatory Visit

## 2021-12-10 ENCOUNTER — Ambulatory Visit: Admitting: Family

## 2021-12-13 ENCOUNTER — Encounter: Payer: Self-pay | Admitting: Cardiology

## 2021-12-13 ENCOUNTER — Encounter: Payer: Self-pay | Admitting: Family

## 2021-12-13 ENCOUNTER — Inpatient Hospital Stay: Attending: Hematology & Oncology

## 2021-12-13 ENCOUNTER — Other Ambulatory Visit: Payer: Self-pay

## 2021-12-13 ENCOUNTER — Encounter: Payer: Self-pay | Admitting: *Deleted

## 2021-12-13 ENCOUNTER — Inpatient Hospital Stay (HOSPITAL_BASED_OUTPATIENT_CLINIC_OR_DEPARTMENT_OTHER): Admitting: Family

## 2021-12-13 VITALS — BP 142/98 | HR 79 | Temp 98.3°F | Resp 18 | Ht 63.0 in | Wt 116.8 lb

## 2021-12-13 DIAGNOSIS — R5383 Other fatigue: Secondary | ICD-10-CM | POA: Diagnosis not present

## 2021-12-13 DIAGNOSIS — R519 Headache, unspecified: Secondary | ICD-10-CM | POA: Diagnosis not present

## 2021-12-13 DIAGNOSIS — D509 Iron deficiency anemia, unspecified: Secondary | ICD-10-CM | POA: Insufficient documentation

## 2021-12-13 DIAGNOSIS — D500 Iron deficiency anemia secondary to blood loss (chronic): Secondary | ICD-10-CM

## 2021-12-13 DIAGNOSIS — R002 Palpitations: Secondary | ICD-10-CM | POA: Diagnosis not present

## 2021-12-13 DIAGNOSIS — R42 Dizziness and giddiness: Secondary | ICD-10-CM | POA: Diagnosis not present

## 2021-12-13 DIAGNOSIS — Z79899 Other long term (current) drug therapy: Secondary | ICD-10-CM | POA: Diagnosis not present

## 2021-12-13 LAB — CBC WITH DIFFERENTIAL (CANCER CENTER ONLY)
Abs Immature Granulocytes: 0.05 10*3/uL (ref 0.00–0.07)
Basophils Absolute: 0 10*3/uL (ref 0.0–0.1)
Basophils Relative: 1 %
Eosinophils Absolute: 0 10*3/uL (ref 0.0–0.5)
Eosinophils Relative: 1 %
HCT: 37.7 % (ref 36.0–46.0)
Hemoglobin: 12.3 g/dL (ref 12.0–15.0)
Immature Granulocytes: 1 %
Lymphocytes Relative: 28 %
Lymphs Abs: 1.3 10*3/uL (ref 0.7–4.0)
MCH: 31 pg (ref 26.0–34.0)
MCHC: 32.6 g/dL (ref 30.0–36.0)
MCV: 95 fL (ref 80.0–100.0)
Monocytes Absolute: 0.3 10*3/uL (ref 0.1–1.0)
Monocytes Relative: 7 %
Neutro Abs: 2.8 10*3/uL (ref 1.7–7.7)
Neutrophils Relative %: 62 %
Platelet Count: 217 10*3/uL (ref 150–400)
RBC: 3.97 MIL/uL (ref 3.87–5.11)
RDW: 12.7 % (ref 11.5–15.5)
WBC Count: 4.4 10*3/uL (ref 4.0–10.5)
nRBC: 0 % (ref 0.0–0.2)

## 2021-12-13 LAB — IRON AND IRON BINDING CAPACITY (CC-WL,HP ONLY)
Iron: 92 ug/dL (ref 28–170)
Saturation Ratios: 27 % (ref 10.4–31.8)
TIBC: 343 ug/dL (ref 250–450)
UIBC: 251 ug/dL (ref 148–442)

## 2021-12-13 LAB — RETICULOCYTES
Immature Retic Fract: 12.2 % (ref 2.3–15.9)
RBC.: 4.02 MIL/uL (ref 3.87–5.11)
Retic Count, Absolute: 48.2 10*3/uL (ref 19.0–186.0)
Retic Ct Pct: 1.2 % (ref 0.4–3.1)

## 2021-12-13 LAB — FERRITIN: Ferritin: 21 ng/mL (ref 11–307)

## 2021-12-13 NOTE — Progress Notes (Signed)
Hematology and Oncology Follow Up Visit  Theresa Cruz [id number] [birth year] 36 y.o. 12/13/2021   Principle Diagnosis:  Iron deficiency anemia   Current Therapy:   Fusion plus 1 capsule PO daily - has not yet started  Of note, patient had anaphylactic reaction with Injectafer   Interim History:  Theresa Cruz is here today for follow-up. She is doing fialry well. She has fatigue at times.  She states that she has dizziness that is mild daily unchanged from baseline. Occasional palpitations come and go.  No fever, chills, n/v, cough, rash, SOB, chest pain, abdominal pain or changes in bowel or bladder habits.  She recently had to visit the ED due to a persistent migraine that had lasted over a week at that point. She still has a mild headache at times but states that the migraine has resolved.  She is doing well in PT for EDS.  She was recently prescribed Zoloft for anxiety and plans to start this week.  She noted that her cycle has been regular with lighter flow. No other blood loss noted.  No abnormal bruising or petechiae.  She has not started Fusion plus yet but states she will begin this week.  She notes occasional puffiness in her fingers that comes and goes. No numbness or tingling in her extremities at this time.  No falls or syncope reported.  Appetite comes and goes recently. Hydration has been good. Weight is stable at 116 lbs.   ECOG Performance Status: 1 - Symptomatic but completely ambulatory  Medications:  Allergies as of 12/13/2021       Reactions   Ferric Carboxymaltose Anaphylaxis, Shortness Of Breath, Nausea And Vomiting   Fish Allergy Hives, Itching   Fish Oil Itching   Imitrex [sumatriptan] Other (See Comments)   Muscle tension   Other Hives   Tree nuts   Peach Flavor Hives, Shortness Of Breath   Peanut Oil Hives, Itching   Peanut-containing Drug Products Hives   Pollen Extract Shortness Of Breath   Rizatriptan Other (See Comments)   Muscles  tightening   Shellfish-derived Products Anaphylaxis   Cocoa Other (See Comments)   Migraine   Gluten Meal Nausea And Vomiting, Other (See Comments)   Migraine   Milk (cow) Nausea And Vomiting   Eletriptan Other (See Comments)   Topamax [topiramate]    Eletriptan Hydrobromide Other (See Comments)        Medication List        Accurate as of December 13, 2021 10:51 AM. If you have any questions, ask your nurse or doctor.          albuterol 108 (90 Base) MCG/ACT inhaler Commonly known as: VENTOLIN HFA Inhale 2 puffs into the lungs every 4 (four) hours as needed for wheezing or shortness of breath.   EPINEPHrine 0.3 mg/0.3 mL Soaj injection Commonly known as: EPI-PEN Inject 0.3 mg into the muscle as needed for anaphylaxis.   fluticasone 50 MCG/ACT nasal spray Commonly known as: FLONASE Place 2 sprays into both nostrils daily as needed for allergies or rhinitis.   Fusion Plus Caps Take 1 capsule by mouth daily.   levocetirizine 5 MG tablet Commonly known as: XYZAL Take 1 tablet (5 mg total) by mouth every evening.   olopatadine 0.1 % ophthalmic solution Commonly known as: PATANOL Place 1 drop into both eyes 2 (two) times daily.   triamcinolone cream 0.1 % Commonly known as: KENALOG Apply 1 Application topically 2 (two) times daily.  Allergies:  Allergies  Allergen Reactions   Ferric Carboxymaltose Anaphylaxis, Shortness Of Breath and Nausea And Vomiting   Fish Allergy Hives and Itching   Fish Oil Itching   Imitrex [Sumatriptan] Other (See Comments)    Muscle tension   Other Hives    Tree nuts   Peach Flavor Hives and Shortness Of Breath   Peanut Oil Hives and Itching   Peanut-Containing Drug Products Hives   Pollen Extract Shortness Of Breath   Rizatriptan Other (See Comments)    Muscles tightening   Shellfish-Derived Products Anaphylaxis   Cocoa Other (See Comments)    Migraine   Gluten Meal Nausea And Vomiting and Other (See Comments)     Migraine   Milk (Cow) Nausea And Vomiting   Eletriptan Other (See Comments)   Topamax [Topiramate]    Eletriptan Hydrobromide Other (See Comments)    Past Medical History, Surgical history, Social history, and Family History were reviewed and updated.  Review of Systems: All other 10 point review of systems is negative.   Physical Exam:  height is '5\' 3"'$  (1.6 m) and weight is 116 lb 12.8 oz (53 kg). Her oral temperature is 98.3 F (36.8 C). Her blood pressure is 142/98 (abnormal) and her pulse is 79. Her respiration is 18 and oxygen saturation is 100%.   Wt Readings from Last 3 Encounters:  12/13/21 116 lb 12.8 oz (53 kg)  10/27/21 118 lb (53.5 kg)  10/04/21 118 lb (53.5 kg)    Ocular: Sclerae unicteric, pupils equal, round and reactive to light Ear-nose-throat: Oropharynx clear, dentition fair Lymphatic: No cervical or supraclavicular adenopathy Lungs no rales or rhonchi, good excursion bilaterally Heart regular rate and rhythm, no murmur appreciated Abd soft, nontender, positive bowel sounds MSK no focal spinal tenderness, no joint edema Neuro: non-focal, well-oriented, appropriate affect Breasts: Deferred   Lab Results  Component Value Date   WBC 4.4 12/13/2021   HGB 12.3 12/13/2021   HCT 37.7 12/13/2021   MCV 95.0 12/13/2021   PLT 217 12/13/2021   Lab Results  Component Value Date   FERRITIN 15 10/04/2021   IRON 156 10/04/2021   TIBC 375 10/04/2021   UIBC 219 10/04/2021   IRONPCTSAT 42 (H) 10/04/2021   Lab Results  Component Value Date   RETICCTPCT 1.2 12/13/2021   RBC 4.02 12/13/2021   No results found for: "KPAFRELGTCHN", "LAMBDASER", "KAPLAMBRATIO" No results found for: "IGGSERUM", "IGA", "IGMSERUM" No results found for: "TOTALPROTELP", "ALBUMINELP", "A1GS", "A2GS", "BETS", "BETA2SER", "GAMS", "MSPIKE", "SPEI"   Chemistry      Component Value Date/Time   NA 138 10/04/2021 1037   K 3.7 10/04/2021 1037   CL 105 10/04/2021 1037   CO2 27 10/04/2021 1037    BUN 14 10/04/2021 1037   CREATININE 0.78 10/04/2021 1037   CREATININE 0.66 11/12/2015 1439      Component Value Date/Time   CALCIUM 9.5 10/04/2021 1037   ALKPHOS 30 (L) 10/04/2021 1037   AST 17 10/04/2021 1037   ALT 9 10/04/2021 1037   BILITOT 0.7 10/04/2021 1037       Impression and Plan: Theresa Cruz is a very pleasant 36 yo African American female with history of iron deficiency anemia.  Iron studies are pending.  She will begin taking Fusion plus daily.  Follow-up in 4 months.   Lottie Dawson, NP 11/20/202310:51 AM

## 2021-12-14 ENCOUNTER — Encounter: Payer: Self-pay | Admitting: Family

## 2021-12-27 ENCOUNTER — Ambulatory Visit: Attending: Cardiology

## 2021-12-27 DIAGNOSIS — R011 Cardiac murmur, unspecified: Secondary | ICD-10-CM

## 2021-12-28 ENCOUNTER — Encounter: Payer: Self-pay | Admitting: Cardiology

## 2021-12-28 ENCOUNTER — Ambulatory Visit: Attending: Cardiology | Admitting: Cardiology

## 2021-12-28 VITALS — BP 118/72 | HR 78 | Ht 63.0 in | Wt 115.0 lb

## 2021-12-28 DIAGNOSIS — R002 Palpitations: Secondary | ICD-10-CM | POA: Diagnosis not present

## 2021-12-28 DIAGNOSIS — E7800 Pure hypercholesterolemia, unspecified: Secondary | ICD-10-CM | POA: Diagnosis not present

## 2021-12-28 DIAGNOSIS — R0609 Other forms of dyspnea: Secondary | ICD-10-CM | POA: Diagnosis not present

## 2021-12-28 DIAGNOSIS — R011 Cardiac murmur, unspecified: Secondary | ICD-10-CM | POA: Diagnosis not present

## 2021-12-28 DIAGNOSIS — F419 Anxiety disorder, unspecified: Secondary | ICD-10-CM

## 2021-12-28 NOTE — Progress Notes (Signed)
Cardiology Consultation:    Date:  12/28/2021   ID:  Theresa Cruz, DOB 04/20/1985, MRN [id number]  PCP:  Andria Frames, PA-C  Cardiologist:  Jenne Campus, MD   Referring MD: Andria Frames, PA-C   Chief Complaint  Patient presents with   heart fluttering   Palpitations   Dizziness   Polydipsia   Urinary Frequency   Concern she may have Pott's    History of Present Illness:    Theresa Cruz is a 36 y.o. female who is being seen today for the evaluation of multiple symptoms at the request of Andria Frames, PA-C.  She was referred to [place] because of constellation of symptoms initially she told me that she is having palpitations.  She said she can feel her heart fluttering sometimes going fast typically about the arm send breath offset is make her feel very uncomfortable and worried.  It happened at different times happens a couple times a day.  She get dizzy when it happened and also developed sometimes shortness of breath.  She was to exercise on the regular basis but developed pains in multiple joints and muscles and that has been cut down.  She still try to have a little bit more active and do all be more exercises with some difficulties.  There was some suspicion for POTS and she was referred to [place] for evaluation for that.  She said when she stands long.  Time it make her uncomfortable.  Even when she is sitting on my examination table she is sitting with her legs up because she said when she put her legs down it make him feel very uncomfortable and dizzy.  She usually have low blood pressure.  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 Counseled on avoidance. She will bring benadryl and Epipen to [place]. She has allergy alert cards written in mandarin to bring. Formatting of this note might be different from the original. Last Assessment & Plan: Formatting of this note might be different from the original. Counseled on avoidance. She will bring    Urticaria     Past Surgical History:  Procedure Laterality Date   NO PAST SURGERIES      Current Medications: Current Meds  Medication Sig   albuterol (VENTOLIN HFA) 108 (90 Base) MCG/ACT inhaler Inhale 2 puffs into the lungs every 4 (four) hours as needed for wheezing or shortness of breath.   EPINEPHrine 0.3 mg/0.3 mL IJ SOAJ injection Inject 0.3 mg into the muscle as needed for anaphylaxis.   fluticasone (FLONASE) 50 MCG/ACT nasal spray Place 2 sprays into both nostrils daily as needed for allergies or rhinitis.   Iron-FA-B Cmp-C-Biot-Probiotic (FUSION PLUS) CAPS Take 1 capsule by mouth daily. (Patient taking differently: Take 1 capsule by mouth every other day.)   levocetirizine (XYZAL) 5 MG tablet Take 1 tablet (5 mg total) by mouth every evening.   Magnesium 200 MG TABS Take 200  mg by mouth daily.   ZOLOFT 25 MG tablet Take 2,512.5 mg by mouth daily.     Allergies:   Ferric carboxymaltose, Fish allergy, Fish oil, Imitrex [sumatriptan], Other, Peach flavor, Peanut oil, Peanut-containing drug products, Pollen extract, Rizatriptan, Shellfish-derived products, Cocoa, Gluten meal, Milk (cow), Eletriptan, Topamax [topiramate], and Eletriptan hydrobromide   Social History   Socioeconomic History   Marital status: Single    Spouse name: Not on file   Number of children: Not on file   Years of education: Not on file    Highest education level: Not on file  Occupational History   Not on file  Tobacco Use   Smoking status: Never   Smokeless tobacco: Never  Vaping Use   Vaping Use: Never used  Substance and Sexual Activity   Alcohol use: Not Currently   Drug use: Never   Sexual activity: Not on file  Other Topics Concern   Not on file  Social History Narrative   Not on file   Social Determinants of Health   Financial Resource Strain: Not on file  Food Insecurity: Not on file  Transportation Needs: Not on file  Physical Activity: Not on file  Stress: Not on file  Social Connections: Not on file     Family History: The patient's family history includes Alzheimer's disease in her maternal grandfather and paternal grandmother; Brain cancer in her father; Cancer in her maternal grandfather; Fibroids in her mother; Hypertension in her maternal grandmother; Prostate cancer in her maternal grandfather; Stroke in her maternal grandmother; Thyroid disease in her maternal grandmother. There is no history of Allergic rhinitis, Angioedema, Asthma, Eczema, Immunodeficiency, or Urticaria. ROS:   Please see the history of present illness.    All 14 point review of systems negative except as described per history of present illness.  EKGs/Labs/Other Studies Reviewed:    The following studies were reviewed today: I did review echocardiogram done few months ago which basically showed normal heart  EKG:  EKG is  ordered today.  The ekg ordered today demonstrates normal sinus rhythm, normal P interval, normal QS complex duration morphology no ST segment changes  Recent Labs: 10/04/2021: ALT 9; BUN 14; Creatinine 0.78; Potassium 3.7; Sodium 138 12/13/2021: Hemoglobin 12.3; Platelet Count 217  Recent Lipid Panel No results found for: "CHOL", "TRIG", "HDL", "CHOLHDL", "VLDL", "LDLCALC", "LDLDIRECT"  Physical Exam:    VS:  BP 118/72 (BP Location: Left Arm, Patient Position: Sitting)   Pulse 78   Ht '5\' 3"'$  (1.6  m)   Wt 115 lb (52.2 kg)   SpO2 96%   BMI 20.37 kg/m     Wt Readings from Last 3 Encounters:  12/28/21 115 lb (52.2 kg)  12/13/21 120 lb (54.4 kg)  12/13/21 116 lb 12.8 oz (53 kg)     GEN:  Well nourished, well developed in no acute distress HEENT: Normal NECK: No JVD; No carotid bruits LYMPHATICS: No lymphadenopathy CARDIAC: RRR, no murmurs, no rubs, no gallops RESPIRATORY:  Clear to auscultation without rales, wheezing or rhonchi  ABDOMEN: Soft, non-tender, non-distended MUSCULOSKELETAL:  No edema; No deformity  SKIN: Warm and dry NEUROLOGIC:  Alert and oriented x 3 PSYCHIATRIC:  Normal affect   ASSESSMENT:    1. Murmur   2. Palpitations   3. Dyspnea on exertion   4. Elevated low density lipoprotein (LDL) cholesterol level   5. Anxiety    PLAN:    In order of problems listed above:  Very complex situation.  I will ask  her to have Zio patch for 2 weeks to see if she get any significant arrhythmia.  She may have some POTS like syndrome and we did talk about therapy for it which include plenty of fluid deliberation of salt and also started exercises on the regular basis but carefully and gently started with exercises like swimming or may be a rowing may be stationary bike.  She said she will try to do that. Dyspnea on exertion so far no cardiac reason for that identified.  Will wait for results of monitor. Elevated LDL.  I did review her K PN which show me her LDL of 148 HDL 62.  This is data actually from last year.  Later we will recheck her fasting lipid profile to see if anything needs to be done about it. I suspect portion of her symptomatology could be related to anxiety.  She already established with psychiatrist and she is looking for psychotherapist which I think will be tremendously beneficial to her.   Medication Adjustments/Labs and Tests Ordered: Current medicines are reviewed at length with the patient today.  Concerns regarding medicines are outlined above.   Orders Placed This Encounter  Procedures   LONG TERM MONITOR (3-14 DAYS)   EKG 12-Lead   No orders of the defined types were placed in this encounter.   Signed, Park Liter, MD, [facility]. 12/28/2021 4:45 PM    [facility]

## 2021-12-28 NOTE — Patient Instructions (Signed)
Medication Instructions:  Your physician recommends that you continue on your current medications as directed. Please refer to the Current Medication list given to you today.  *If you need a refill on your cardiac medications before your next appointment, please call your pharmacy*   Lab Work: None Ordered If you have labs (blood work) drawn today and your tests are completely normal, you will receive your results only by: MyChart Message (if you have MyChart) OR A paper copy in the mail If you have any lab test that is abnormal or we need to change your treatment, we will call you to review the results.   Testing/Procedures:  WHY IS MY DOCTOR PRESCRIBING ZIO? The Zio system is proven and trusted by physicians to detect and diagnose irregular heart rhythms -- and has been prescribed to hundreds of thousands of patients.  The FDA has cleared the Zio system to monitor for many different kinds of irregular heart rhythms. In a study, physicians were able to reach a diagnosis 90% of the time with the Zio system1.  You can wear the Zio monitor -- a small, discreet, comfortable patch -- during your normal day-to-day activity, including while you sleep, shower, and exercise, while it records every single heartbeat for analysis.  1Barrett, P., et al. Comparison of 24 Hour Holter Monitoring Versus 14 Day Novel Adhesive Patch Electrocardiographic Monitoring. American Journal of Medicine, 2014.  ZIO VS. HOLTER MONITORING The Zio monitor can be comfortably worn for up to 14 days. Holter monitors can be worn for 24 to 48 hours, limiting the time to record any irregular heart rhythms you may have. Zio is able to capture data for the 51% of patients who have their first symptom-triggered arrhythmia after 48 hours.1  LIVE WITHOUT RESTRICTIONS The Zio ambulatory cardiac monitor is a small, unobtrusive, and water-resistant patch--you might even forget you're wearing it. The Zio monitor records and stores  every beat of your heart, whether you're sleeping, working out, or showering.     Follow-Up: At [facility], you and your health needs are our priority.  As part of our continuing mission to provide you with exceptional heart care, we have created designated Provider Care Teams.  These Care Teams include your primary Cardiologist (physician) and Advanced Practice Providers (APPs -  Physician Assistants and Nurse Practitioners) who all work together to provide you with the care you need, when you need it.  We recommend signing up for the patient portal called "MyChart".  Sign up information is provided on this After Visit Summary.  MyChart is used to connect with patients for Virtual Visits (Telemedicine).  Patients are able to view lab/test results, encounter notes, upcoming appointments, etc.  Non-urgent messages can be sent to your provider as well.   To learn more about what you can do with MyChart, go to [email or web address].    Your next appointment:   2 month(s)  The format for your next appointment:   In Person  Provider:   Robert Krasowski, MD    Other Instructions NA  

## 2022-01-03 ENCOUNTER — Ambulatory Visit: Admitting: Family

## 2022-01-03 ENCOUNTER — Other Ambulatory Visit

## 2022-01-05 DIAGNOSIS — F332 Major depressive disorder, recurrent severe without psychotic features: Secondary | ICD-10-CM | POA: Insufficient documentation

## 2022-01-19 DIAGNOSIS — F605 Obsessive-compulsive personality disorder: Secondary | ICD-10-CM | POA: Insufficient documentation

## 2022-01-25 ENCOUNTER — Ambulatory Visit: Admitting: Cardiology

## 2022-02-16 DIAGNOSIS — F900 Attention-deficit hyperactivity disorder, predominantly inattentive type: Secondary | ICD-10-CM | POA: Insufficient documentation

## 2022-02-28 ENCOUNTER — Other Ambulatory Visit: Payer: Self-pay

## 2022-03-01 ENCOUNTER — Ambulatory Visit: Attending: Cardiology | Admitting: Cardiology

## 2022-03-01 ENCOUNTER — Encounter: Payer: Self-pay | Admitting: Cardiology

## 2022-03-01 VITALS — BP 120/80 | HR 69 | Ht 62.0 in | Wt 112.0 lb

## 2022-03-01 DIAGNOSIS — R002 Palpitations: Secondary | ICD-10-CM

## 2022-03-01 DIAGNOSIS — R451 Restlessness and agitation: Secondary | ICD-10-CM

## 2022-03-01 DIAGNOSIS — R5383 Other fatigue: Secondary | ICD-10-CM | POA: Diagnosis not present

## 2022-03-01 DIAGNOSIS — F419 Anxiety disorder, unspecified: Secondary | ICD-10-CM

## 2022-03-01 DIAGNOSIS — F900 Attention-deficit hyperactivity disorder, predominantly inattentive type: Secondary | ICD-10-CM

## 2022-03-01 NOTE — Progress Notes (Signed)
Cardiology Office Note:    Date:  03/01/2022   ID:  Theresa Cruz, DOB 1985/06/13, MRN [id number]  PCP:  Andria Frames, PA-C  Cardiologist:  Jenne Campus, MD    Referring MD: Gerald Leitz   Chief Complaint  Patient presents with   Results    History of Present Illness:    Theresa Cruz is a 37 y.o. female with past medical history significant for palpitation that was the reason for consultation in my office.  Additional problem that being recently recognized his OCD, anxiety, some ADHD.  She did see psychiatrist diagnosed as being established she was getting Zoloft with Very good response and she also seeing psychotherapist doing much better.  She did wear a monitor which showed just sinus tachycardia no pathological rhythm, echocardiogram is being performed which showed normal left ventricle ejection fraction without any pathology.  She comes today to talk about that.  Overall she is doing fine still described to have some palpitations but not as bad as it was before.  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 Counseled on avoidance. She will bring benadryl and Epipen to [place]. She has allergy alert cards written in mandarin to bring. Formatting of this note might be different from the original. Last Assessment & Plan: Formatting of this note might be different from the original. Counseled on avoidance. She will bring    Urticaria     Past Surgical History:   Procedure Laterality Date   NO PAST SURGERIES      Current Medications: Current Meds  Medication Sig   albuterol (VENTOLIN HFA) 108 (90 Base) MCG/ACT inhaler Inhale 2 puffs into the lungs every 4 (four) hours as needed for wheezing or shortness of breath.   EPINEPHrine 0.3 mg/0.3 mL IJ SOAJ injection Inject 0.3 mg into the muscle as needed for anaphylaxis.   fluticasone (FLONASE) 50 MCG/ACT nasal spray Place 2 sprays into both nostrils daily as needed for allergies or rhinitis.   Iron-FA-B Cmp-C-Biot-Probiotic (FUSION PLUS) CAPS Take 1 capsule by mouth daily. (Patient taking differently: Take 1 capsule by mouth every other day.)   levocetirizine (XYZAL) 5 MG tablet Take 1 tablet (5 mg total) by mouth every evening.   Magnesium 200 MG TABS Take 200 mg by mouth daily.   Rimegepant Sulfate (NURTEC) 75 MG TBDP Take 1 tablet by mouth daily.   sertraline (ZOLOFT) 25 MG tablet Take 1 tablet by mouth daily.   [DISCONTINUED] ZOLOFT 25 MG tablet Take 2,512.5 mg by mouth daily.     Allergies:   Ferric carboxymaltose, Fish allergy, Fish oil, Imitrex [sumatriptan], Other, Peach flavor, Peanut oil, Peanut-containing drug products, Pollen extract, Rizatriptan, Shellfish-derived products, Cocoa, Gluten meal, Milk (cow), Eletriptan, Topamax [topiramate], Eletriptan hydrobromide, and Tape   Social History   Socioeconomic History   Marital status: Single    Spouse name: Not on file   Number of children: Not on file   Years of education: Not on file   Highest education level: Not on file  Occupational History   Not on file  Tobacco Use   Smoking status: Never   Smokeless tobacco: Never  Vaping Use   Vaping Use: Never used  Substance and Sexual Activity   Alcohol use: Not Currently   Drug use: Never   Sexual activity: Not on file  Other Topics Concern   Not on file  Social History Narrative   Not on file   Social Determinants of Health   Financial Resource Strain: Not on file  Food  Insecurity: Not on file  Transportation Needs: Not on file  Physical Activity: Not on file  Stress: Not on file  Social Connections: Not on file     Family History: The patient's family history includes Alzheimer's disease in her maternal grandfather and paternal grandmother; Brain cancer in her father; Cancer in her maternal grandfather; Fibroids in her mother; Hypertension in her maternal grandmother; Prostate cancer in her maternal grandfather; Stroke in her maternal grandmother; Thyroid disease in her maternal grandmother. There is no history of Allergic rhinitis, Angioedema, Asthma, Eczema, Immunodeficiency, or Urticaria. ROS:   Please see the history of present illness.    All 14 point review of systems negative except as described per history of present illness  EKGs/Labs/Other Studies Reviewed:      Recent Labs: 10/04/2021: ALT 9; BUN 14; Creatinine 0.78; Potassium 3.7; Sodium 138 12/13/2021: Hemoglobin 12.3; Platelet Count 217  Recent Lipid Panel No results found for: "CHOL", "TRIG", "HDL", "CHOLHDL", "VLDL", "LDLCALC", "LDLDIRECT"  Physical Exam:    VS:  BP 120/80 (BP Location: Left Arm, Patient Position: Sitting)   Pulse 69   Ht '5\' 2"'$  (1.575 m)   Wt 112 lb (50.8 kg)   SpO2 98%   BMI 20.49 kg/m     Wt Readings from Last 3 Encounters:  03/01/22 112 lb (50.8 kg)  12/28/21 115 lb (52.2 kg)  12/13/21 120 lb (54.4 kg)     GEN:  Well nourished, well developed in no acute distress HEENT: Normal NECK: No JVD; No carotid bruits LYMPHATICS: No lymphadenopathy CARDIAC: RRR, no murmurs, no rubs, no gallops RESPIRATORY:  Clear to auscultation without rales, wheezing or rhonchi  ABDOMEN: Soft, non-tender, non-distended MUSCULOSKELETAL:  No edema; No deformity  SKIN: Warm and dry LOWER EXTREMITIES: no swelling NEUROLOGIC:  Alert and oriented x 3 PSYCHIATRIC:  Normal affect   ASSESSMENT:    1. Restless   2. Fatigue, unspecified type   3. Palpitations   4. Anxiety    5. ADHD, predominantly inattentive type    PLAN:    In order of problems listed above:  Palpitations, no significant arrhythmia identified, she does have sinus tachycardia we discussed potential pharmacological option for this situation which will be beta-blocker like metoprolol titrate or ivabradine, however she prefers not to use any medication which is absolutely fine from my point of view.  I explained to her that this is not deadly condition, if she thinks that she needs a medication that we will be available for her.  In the meantime I will check her thyroid profile make sure we not dealing with hyperthyroidism.  Recently she has been diagnosed with panic disorder and anxiety OCD and ADHD.  I warned her that some of the medications for ADD can create some tachycardia.  If that is the case again beta-blocker ivabradine can be used to slow it down.  I did encourage her also to be active and try to exercise on the regular basis which should help with tachycardia as well.  Anxiety, ADHD: That being follow-up by psychiatrist.  That is a new diagnosis Ellen's Danlos syndrome.  That is followed by sports medicine I was chaperoned by my nurse Jacobo Forest during entire visit  Medication Adjustments/Labs and Tests Ordered: Current medicines are reviewed at length with the patient today.  Concerns regarding medicines are outlined above.  Orders Placed This Encounter  Procedures   TSH   Medication changes: No orders of the defined types were placed in this encounter.   Signed, Park Liter, MD, [facility] 03/01/2022 11:11 AM    [place]

## 2022-03-01 NOTE — Patient Instructions (Signed)
Medication Instructions:   No Changes    Lab Work: 3rd floor  [address] TSH- Today If you have labs (blood work) drawn today and your tests are completely normal, you will receive your results only by: [place] (if you have MyChart) OR A paper copy in the mail If you have any lab test that is abnormal or we need to change your treatment, we will call you to review the results.   Testing/Procedures: None Ordered   Follow-Up: At [facility], you and your health needs are our priority.  As part of our continuing mission to provide you with exceptional heart care, we have created designated Provider Care Teams.  These Care Teams include your primary Cardiologist (physician) and Advanced Practice Providers (APPs -  Physician Assistants and Nurse Practitioners) who all work together to provide you with the care you need, when you need it.  We recommend signing up for the patient portal called "MyChart".  Sign up information is provided on this After Visit Summary.  MyChart is used to connect with patients for Virtual Visits (Telemedicine).  Patients are able to view lab/test results, encounter notes, upcoming appointments, etc.  Non-urgent messages can be sent to your provider as well.   To learn more about what you can do with MyChart, go to [email or web address].    Your next appointment:   3 month(s)  The format for your next appointment:   In Person  Provider:   Jenne Campus, MD    Other Instructions NA

## 2022-03-02 LAB — TSH: TSH: 1.64 u[IU]/mL (ref 0.450–4.500)

## 2022-03-10 ENCOUNTER — Telehealth: Payer: Self-pay

## 2022-03-10 NOTE — Telephone Encounter (Signed)
Results reviewed with pt as per Dr. Krasowski's note.  Pt verbalized understanding and had no additional questions. Routed to PCP  

## 2022-03-18 ENCOUNTER — Encounter: Payer: Self-pay | Admitting: Cardiology

## 2022-03-24 ENCOUNTER — Encounter: Payer: Self-pay | Admitting: Cardiology

## 2022-03-24 ENCOUNTER — Other Ambulatory Visit: Payer: Self-pay

## 2022-03-24 ENCOUNTER — Emergency Department (HOSPITAL_COMMUNITY)

## 2022-03-24 ENCOUNTER — Encounter (HOSPITAL_COMMUNITY): Payer: Self-pay

## 2022-03-24 ENCOUNTER — Emergency Department (HOSPITAL_COMMUNITY)
Admission: EM | Admit: 2022-03-24 | Discharge: 2022-03-24 | Disposition: A | Discharge status: Home / Self Care | Attending: Emergency Medicine | Admitting: Emergency Medicine

## 2022-03-24 DIAGNOSIS — R072 Precordial pain: Secondary | ICD-10-CM | POA: Insufficient documentation

## 2022-03-24 DIAGNOSIS — R002 Palpitations: Secondary | ICD-10-CM | POA: Diagnosis not present

## 2022-03-24 DIAGNOSIS — Z7952 Long term (current) use of systemic steroids: Secondary | ICD-10-CM | POA: Diagnosis not present

## 2022-03-24 DIAGNOSIS — R000 Tachycardia, unspecified: Secondary | ICD-10-CM | POA: Insufficient documentation

## 2022-03-24 DIAGNOSIS — J45909 Unspecified asthma, uncomplicated: Secondary | ICD-10-CM | POA: Diagnosis not present

## 2022-03-24 DIAGNOSIS — Z91010 Allergy to peanuts: Secondary | ICD-10-CM | POA: Diagnosis not present

## 2022-03-24 DIAGNOSIS — R0789 Other chest pain: Secondary | ICD-10-CM

## 2022-03-24 LAB — BASIC METABOLIC PANEL
Anion gap: 8 (ref 5–15)
BUN: 15 mg/dL (ref 6–20)
CO2: 26 mmol/L (ref 22–32)
Calcium: 9.2 mg/dL (ref 8.9–10.3)
Chloride: 102 mmol/L (ref 98–111)
Creatinine, Ser: 0.74 mg/dL (ref 0.44–1.00)
GFR, Estimated: >60 mL/min (ref >60)
Glucose, Bld: 89 mg/dL (ref 70–99)
Potassium: 3.5 mmol/L (ref 3.5–5.1)
Sodium: 136 mmol/L (ref 135–145)

## 2022-03-24 LAB — CBC
HCT: 38.8 % (ref 36.0–46.0)
Hemoglobin: 13 g/dL (ref 12.0–15.0)
MCH: 31.6 pg (ref 26.0–34.0)
MCHC: 33.5 g/dL (ref 30.0–36.0)
MCV: 94.4 fL (ref 80.0–100.0)
Platelets: 166 10*3/uL (ref 150–400)
RBC: 4.11 MIL/uL (ref 3.87–5.11)
RDW: 12.7 % (ref 11.5–15.5)
WBC: 4.5 10*3/uL (ref 4.0–10.5)
nRBC: 0 % (ref 0.0–0.2)

## 2022-03-24 LAB — I-STAT BETA HCG BLOOD, ED (MC, WL, AP ONLY): I-stat hCG, quantitative: <5 m[IU]/mL (ref <5)

## 2022-03-24 LAB — HEPATIC FUNCTION PANEL
ALT: 12 U/L (ref 0–44)
AST: 22 U/L (ref 15–41)
Albumin: 3.6 g/dL (ref 3.5–5.0)
Alkaline Phosphatase: 33 U/L — ABNORMAL LOW (ref 38–126)
Bilirubin, Direct: <0.1 mg/dL (ref 0.0–0.2)
Total Bilirubin: 0.7 mg/dL (ref 0.3–1.2)
Total Protein: 7.2 g/dL (ref 6.5–8.1)

## 2022-03-24 LAB — TROPONIN I (HIGH SENSITIVITY)
Troponin I (High Sensitivity): <2 ng/L (ref <18)
Troponin I (High Sensitivity): <2 ng/L (ref <18)

## 2022-03-24 LAB — LIPASE, BLOOD: Lipase: 47 U/L (ref 11–51)

## 2022-03-24 LAB — D-DIMER, QUANTITATIVE: D-Dimer, Quant: 0.53 ug/mL-FEU — ABNORMAL HIGH (ref 0.00–0.50)

## 2022-03-24 LAB — BRAIN NATRIURETIC PEPTIDE: B Natriuretic Peptide: 9.9 pg/mL (ref 0.0–100.0)

## 2022-03-24 MED ORDER — IOHEXOL 350 MG/ML SOLN
75.0000 mL | Freq: Once | INTRAVENOUS | Status: AC | PRN
  Administered 2022-03-24: 75 mL via INTRAVENOUS

## 2022-03-24 NOTE — ED Notes (Signed)
Pt given applesauce and water.

## 2022-03-24 NOTE — Discharge Instructions (Signed)
Your workup today was overall reassuring.  The D-dimer was slightly elevated which led [place] to get the CT scan that was reassuring with no evidence of blood clots.  Your cardiac enzymes were negative both times we checked them as was the rest of your workup.  We feel you are safe for discharge home.  We did add the tryptase on for your primary team to follow in regards to inflammation workup.  If any symptoms change or worsen, please return to the nearest emergency department please rest and stay hydrated.

## 2022-03-24 NOTE — ED Notes (Signed)
Pt transported to CT at this time.

## 2022-03-24 NOTE — ED Notes (Signed)
Pt transported to xray at this time

## 2022-03-24 NOTE — ED Notes (Signed)
Called CT to ask to check on pt status for CT. Per CT she is the next one up

## 2022-03-24 NOTE — ED Notes (Signed)
Pt w/ bilateral AC hives form BP cuff. Notified EDP

## 2022-03-24 NOTE — ED Provider Notes (Signed)
[facility] Provider Note   CSN: [id number] Arrival date & time: 03/24/22  [id number]     History  Chief Complaint  Patient presents with   Chest Pain    Theresa Cruz is a 37 y.o. female.  The history is provided by the patient and medical records. No language interpreter was used.  Chest Pain Pain location:  Substernal area Pain quality: aching, dull and pressure   Pain radiates to:  Does not radiate Pain severity:  Severe Onset quality:  Gradual Duration:  5 hours Timing:  Constant Progression:  Waxing and waning Chronicity:  Recurrent Ineffective treatments:  None tried Associated symptoms: anxiety, fatigue, nausea, palpitations and shortness of breath   Associated symptoms: no abdominal pain, no back pain, no claudication, no cough, no diaphoresis, no headache, no lower extremity edema, no near-syncope and no vomiting   Risk factors: not female and no prior DVT/PE        Home Medications Prior to Admission medications   Medication Sig Start Date End Date Taking? Authorizing Provider  albuterol (VENTOLIN HFA) 108 (90 Base) MCG/ACT inhaler Inhale 2 puffs into the lungs every 4 (four) hours as needed for wheezing or shortness of breath. 10/01/21   Kennith Gain, MD  EPINEPHrine 0.3 mg/0.3 mL IJ SOAJ injection Inject 0.3 mg into the muscle as needed for anaphylaxis. 10/01/21   Kennith Gain, MD  fluticasone (FLONASE) 50 MCG/ACT nasal spray Place 2 sprays into both nostrils daily as needed for allergies or rhinitis. 11/04/20   Kennith Gain, MD  Iron-FA-B Cmp-C-Biot-Probiotic (FUSION PLUS) CAPS Take 1 capsule by mouth daily. Patient taking differently: Take 1 capsule by mouth every other day. 10/08/21   Celso Amy, NP  levocetirizine (XYZAL) 5 MG tablet Take 1 tablet (5 mg total) by mouth every evening. 10/01/21   Kennith Gain, MD  Magnesium 200 MG TABS Take 200 mg by mouth daily.     [provider]  Rimegepant Sulfate (NURTEC) 75 MG TBDP Take 1 tablet by mouth daily. 01/12/22   [provider]  sertraline (ZOLOFT) 25 MG tablet Take 1 tablet by mouth daily. 01/07/22   [provider]      Allergies    Ferric carboxymaltose, Fish allergy, Fish oil, Imitrex [sumatriptan], Other, Peach flavor, Peanut oil, Peanut-containing drug products, Pollen extract, Rizatriptan, Shellfish-derived products, Cocoa, Gluten meal, Milk (cow), Eletriptan, Topamax [topiramate], Eletriptan hydrobromide, and Tape    Review of Systems   Review of Systems  Constitutional:  Positive for fatigue. Negative for chills and diaphoresis.  HENT:  Negative for congestion.   Respiratory:  Positive for chest tightness and shortness of breath. Negative for cough and wheezing.   Cardiovascular:  Positive for chest pain and palpitations. Negative for claudication and near-syncope.  Gastrointestinal:  Positive for nausea. Negative for abdominal pain, constipation, diarrhea and vomiting.  Genitourinary:  Negative for dysuria and flank pain.  Musculoskeletal:  Negative for back pain, neck pain and neck stiffness.  Skin:  Negative for rash and wound.  Neurological:  Negative for headaches.  Psychiatric/Behavioral:  Negative for agitation and confusion.   All other systems reviewed and are negative.   Physical Exam Updated Vital Signs BP (!) 132/100   Pulse 79   Temp 98.1 F (36.7 C)   Resp 20   Ht '5\' 2"'$  (1.575 m)   Wt 50.8 kg   SpO2 99%   BMI 20.48 kg/m  Physical Exam Vitals and nursing note  reviewed.  Constitutional:      General: She is not in acute distress.    Appearance: She is well-developed. She is not ill-appearing, toxic-appearing or diaphoretic.  HENT:     Head: Normocephalic and atraumatic.     Right Ear: External ear normal.     Left Ear: External ear normal.     Nose: Nose normal.     Mouth/Throat:     Pharynx: No oropharyngeal exudate.  Eyes:      Conjunctiva/sclera: Conjunctivae normal.     Pupils: Pupils are equal, round, and reactive to light.  Cardiovascular:     Rate and Rhythm: Normal rate.     Heart sounds: No murmur heard. Pulmonary:     Effort: No respiratory distress.     Breath sounds: No stridor. No decreased breath sounds, wheezing, rhonchi or rales.  Chest:     Chest wall: Tenderness present.  Abdominal:     General: There is no distension.     Tenderness: There is no abdominal tenderness. There is no rebound.  Musculoskeletal:     Cervical back: Normal range of motion and neck supple.     Right lower leg: No tenderness. No edema.     Left lower leg: No tenderness. No edema.  Skin:    General: Skin is warm.     Coloration: Skin is not pale.     Findings: No erythema or rash.  Neurological:     General: No focal deficit present.     Mental Status: She is alert.     Motor: No abnormal muscle tone.     Coordination: Coordination normal.     Deep Tendon Reflexes: Reflexes are normal and symmetric.  Psychiatric:        Mood and Affect: Mood is anxious.     ED Results / Procedures / Treatments   Labs (all labs ordered are listed, but only abnormal results are displayed) Labs Reviewed  D-DIMER, QUANTITATIVE - Abnormal; Notable for the following components:      Result Value   D-Dimer, Quant 0.53 (*)    All other components within normal limits  HEPATIC FUNCTION PANEL - Abnormal; Notable for the following components:   Alkaline Phosphatase 33 (*)    All other components within normal limits  BASIC METABOLIC PANEL  CBC  BRAIN NATRIURETIC PEPTIDE  LIPASE, BLOOD  TRYPTASE  I-STAT BETA HCG BLOOD, ED ([facility], [facility], [facility] ONLY)  TROPONIN I (HIGH SENSITIVITY)  TROPONIN I (HIGH SENSITIVITY)    EKG EKG Interpretation  Date/Time:  Thursday March 24 2022 07:15:50 EST Ventricular Rate:  71 PR Interval:  120 QRS Duration: 80 QT Interval:  394 QTC Calculation: 428 R Axis:   70 Text Interpretation: Normal sinus  rhythm with sinus arrhythmia Nonspecific T wave abnormality Abnormal ECG When compared with ECG of 02-Dec-2004 12:35, PREVIOUS ECG IS PRESENT when comapred to prior, similar appearance. NO STEMI Confirmed by Antony Blackbird [phone]) on 03/24/2022 8:33:38 AM  Radiology CT Angio Chest PE W and/or Wo Contrast  Result Date: 03/24/2022 CLINICAL DATA:  Rule out pulmonary embolism. Positive D-dimer. Chest pain. EXAM: CT ANGIOGRAPHY CHEST WITH CONTRAST TECHNIQUE: Multidetector CT imaging of the chest was performed using the standard protocol during bolus administration of intravenous contrast. Multiplanar CT image reconstructions and MIPs were obtained to evaluate the vascular anatomy. RADIATION DOSE REDUCTION: This exam was performed according to the departmental dose-optimization program which includes automated exposure control, adjustment of the mA and/or kV according to patient size and/or use  of iterative reconstruction technique. CONTRAST:  3m OMNIPAQUE IOHEXOL 350 MG/ML SOLN COMPARISON:  None Available. FINDINGS: Cardiovascular: Satisfactory opacification of the pulmonary arteries to the segmental level. No evidence of pulmonary embolism. Normal heart size. No pericardial effusion. Mediastinum/Nodes: No enlarged mediastinal, hilar, or axillary lymph nodes. Thyroid gland, trachea, and esophagus demonstrate no significant findings. Lungs/Pleura: No pleural fluid or airspace consolidation. No atelectasis or signs of pneumothorax. No interstitial edema. Upper Abdomen: No acute abnormality. Musculoskeletal: No chest wall abnormality. No acute or significant osseous findings. Review of the MIP images confirms the above findings. IMPRESSION: No evidence for acute pulmonary embolism. No acute findings to explain patient's chest pain or shortness of breath. Electronically Signed   By: TKerby MoorsM.D.   On: 03/24/2022 14:01   DG Chest 2 View  Result Date: 03/24/2022 CLINICAL DATA:  Chest pain. EXAM: CHEST - 2  VIEW COMPARISON:  May 05, 2021. FINDINGS: The heart size and mediastinal contours are within normal limits. Both lungs are clear. The visualized skeletal structures are unremarkable. IMPRESSION: No active cardiopulmonary disease. Electronically Signed   By: JMarijo ConceptionM.D.   On: 03/24/2022 08:23    Procedures Procedures    Medications Ordered in ED Medications  iohexol (OMNIPAQUE) 350 MG/ML injection 75 mL (75 mLs Intravenous Contrast Given 03/24/22 1349)    ED Course/ Medical Decision Making/ A&P                             Medical Decision Making Amount and/or Complexity of Data Reviewed Labs: ordered. Radiology: ordered.  Risk Prescription drug management.    RMayleigh Halseyis a 37y.o. female with a past medical history significant for asthma, fibromyalgia, migraines, chronic palpitations, depression, anxiety, obsessive-compulsive personality disorder per the chart, seafood allergy, iron deficiency anemia, and cardiology documentation of Ehlers-Danlos syndrome who presents with chest pain.  According to patient, she has had chest pain starting about 5 AM this morning that was a pressure in her central chest.  It has been waxing and waning.  She does report is been hard to take a deep breath and she has had some shortness of breath with it.  She reports no nausea or vomiting or diaphoresis.  She reports the pain does not radiate.  It is not hurting in her back currently.  It is not stabbing or sharp.  She reports no fevers, chills, congestion, or cough.  Denies any trauma.  She is appearing anxious about it.  She went and got her blood pressure checked at the fire department and was found to be 140 and this made her very concerned as this is reportedly extremely high for her.  Patient reports that she started vitamin D last week and immediately started having some chest tightness, tingling, lip tingling, arm pain, palpitations, tachycardia.  She is now decreased that to 1000  units.  On exam, lungs clear.  Chest slightly tender.  No murmur.  Abdomen nontender.  Good pulses in extremities.  Patient appears anxious but is otherwise resting.  Vital signs are reassuring with no tachycardia, tachypnea, hypoxia, and oxygen saturation was 100% on room air.  Patient has no evidence of acute trauma and is well-appearing.  EKG does not show STEMI.  Clinically I suspect patient has recurrent palpitations and has had history of chest discomfort in the setting of her recent medication changes that provoke this.  We will however get screening labs including troponin, metabolic panel, lipase  make sure is not something below her diaphragm, and will also get a D-dimer as the patient is reporting the symptoms that are pleuritic and difficulty taking a deep breath.  Her pain is not sharp and stabbing and does not go straight to her back so do not suspect this is an acute aortic etiology.  She does have good pulses in extremities on my exam.  We discussed the possibility of aching pain being in her central chest from esophagus or GI source but patient denies any spicy foods.  Will consider GI cocktail if workup is otherwise reassuring.  As patient is established with cardiology, if workup is reassuring, anticipate discharge home.  10:21 AM Order began to return and reassuring as far.  Patient did have some hives form on her left arm where the blood pressure cuff previously was.  Patient says that when she is allergic reaction she is post to get a tryptase level checked per her allergist as they are trying to get a baseline to determine her levels of inflammatory response.  We discussed that this is not a test we typically check in the emergency department but if it would help her outpatient team assembled that a picture of her inflammatory status, we can order tryptase that we will likely not follow-up on today.  Patient understands this.  Will add on a tryptase.  She is okay to eat and drink  and we will wait for delta troponin and D-dimer result.  Anticipate discharge if workup is reassuring.  She does not want other medications at this time.  10:47 AM D-dimer was slightly elevated at 0.53.  We discussed this finding and she does want to get the CT PE study to rule out pulmonary embolism given the pleuritic shortness of breath and discomfort she has had today and the intermittent palpitations and tachycardia at home.  We discussed the possibility of anaphylaxis to contrast media and it does not appear that she has a contrast allergy in the chart.  Patient agrees and will get the CT scan.  If workup is reassuring, anticipate discharge home.  Delta troponin negative.  CT PE study unremarkable.  Patient feeling better.  Patient will follow-up with her PCP and cardiologist for these palpitations and chest pain that is persistent.  She agrees with plan of care and workup was reassuring.  Patient discharged in stable condition with well appearance and improved symptoms.        Final Clinical Impression(s) / ED Diagnoses Final diagnoses:  Atypical chest pain  Palpitations    Rx / DC Orders ED Discharge Orders     None      Clinical Impression: 1. Atypical chest pain   2. Palpitations     Disposition: Discharge  Condition: Good  I have discussed the results, Dx and Tx plan with the pt(& family if present). He/she/they expressed understanding and agree(s) with the plan. Discharge instructions discussed at great length. Strict return precautions discussed and pt &/or family have verbalized understanding of the instructions. No further questions at time of discharge.    New Prescriptions   No medications on file    Follow Up: Gerald Leitz [place] [id number] [phone]     your cardiologist        Sarrinah Gardin, Gwenyth Allegra, MD 03/24/22 [id number]

## 2022-03-24 NOTE — ED Notes (Signed)
Pt tearful and taking deep breaths during assessment.

## 2022-03-24 NOTE — ED Notes (Signed)
Main lab to add on Tryptase lab test.

## 2022-03-24 NOTE — ED Triage Notes (Signed)
Pt c/o non radiating midsternal chest pain started 0530 today. Pt c/o SOB. Pt denies N/V. Pt states went to fire station to get bp checked and the systolic was [id number]. Pt states she doesn't remember the diastolic number. Pt states she has a cardiologist and gets int tachycardia and heart palpitations. Pt stated she started vit D 50,000 units last week. Pt states within 5 mins of taking it she started having lip tingling and left arm pain, tachycardia and palpitations. Pt states her PCP told her to just take vit D 1000 units now.

## 2022-03-24 NOTE — ED Notes (Signed)
Family member at bedside w/ [employer] "pastor" badge asked, "What are we waiting on?" Explained to family member/staff member that "We are waiting on more labs that I am drawing and based on the results she may need a CT scan w/ contrast." Family member then stated, "How long is that going to take, another 2 hrs." Explained to family member that the troponin will at least take an hr and it also depends on if lab is backed up. Family then mentioned that the waiting room was empty. Family member then asked, "When can she eat?". Patient was not asking for food. Explained to family member that the patient cannot eat until we get results back and determine if she needs a CT. Pt's family then stated, "Well she gets migraines when she doesn't eat and that is bad for her."

## 2022-03-24 NOTE — ED Notes (Addendum)
EDT informed this RN that pt and family is recording staff when they go into the room. Will continue to monitor situation and behavior.

## 2022-03-25 LAB — TRYPTASE: Tryptase: 4.7 ug/L (ref 2.2–13.2)

## 2022-03-26 ENCOUNTER — Ambulatory Visit
Admission: EM | Admit: 2022-03-26 | Discharge: 2022-03-26 | Disposition: A | Discharge status: Home / Self Care | Attending: Urgent Care | Admitting: Urgent Care

## 2022-03-26 ENCOUNTER — Encounter: Payer: Self-pay | Admitting: Emergency Medicine

## 2022-03-26 DIAGNOSIS — R399 Unspecified symptoms and signs involving the genitourinary system: Secondary | ICD-10-CM

## 2022-03-26 LAB — POCT URINALYSIS DIP (MANUAL ENTRY)
Bilirubin, UA: NEGATIVE
Blood, UA: NEGATIVE
Glucose, UA: NEGATIVE mg/dL
Ketones, POC UA: NEGATIVE mg/dL
Leukocytes, UA: NEGATIVE
Nitrite, UA: NEGATIVE
Protein Ur, POC: NEGATIVE mg/dL
Spec Grav, UA: 1.01 (ref 1.010–1.025)
Urobilinogen, UA: 0.2 E.U./dL
pH, UA: 6 (ref 5.0–8.0)

## 2022-03-26 NOTE — ED Triage Notes (Signed)
Reports being in ED on Thursday noticing a high BP with chest pain and palpitations. Had a full work up, will follow up with cardiology at the end of this month. States she's noticed fluctuations in BP from 140/110 to 75/55. States she continues to have chest pain and tightness. Was seen yesterday at [facility] and dx w/ an ear infection. States she spoke with a nurse this morning and states she was told she may have a UTI that could be the cause of her symptoms. Denies abdominal pain, back pain, urinary frequency, urgency, dysuria.

## 2022-03-26 NOTE — Discharge Instructions (Addendum)
No indication of UTI today. Recommend follow-up with your PCP and Cardiologist.

## 2022-03-26 NOTE — ED Provider Notes (Addendum)
Roderic Palau    CSN: [id number] Arrival date & time: 03/26/22  1203      History   Chief Complaint Chief Complaint  Patient presents with   Hypertension    Unstable blood pressure chest pain - Entered by patient    HPI Theresa Cruz is a 37 y.o. female.    Hypertension    Patient presents to urgent care with concern for possible UTI.    She endorses fluctuations in her blood pressure recently ranging from 140/110 to 75/55.  Also reports chest tightness/pain.  She had workup in ED 2 days ago with unremarkable blood work and reassuring EKG.    She states she was seen at urgent care yesterday and treated for possible ear infection when she reported "sharp shooting pain" in her R ear". Prescribed Augmentin when provider saw "some redness" and "fluid" but told her it "wasn't a full infection".  Presents today to r/o UTI for cause of BP fluctuations and "cardiac" symptoms.  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 Counseled on avoidance. She will bring benadryl and Epipen to [place]. She has allergy alert cards written in mandarin to bring. Formatting of this note might be different from the original. Last Assessment & Plan: Formatting of this note might be different from the original. Counseled on avoidance. 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/05/2022   Palpitations 12/28/2021   Dyspnea on exertion 12/28/2021   GAD (generalized anxiety disorder) 12/08/2021   Obsessive compulsive disorder 12/08/2021   IDA (iron deficiency anemia) 10/04/2021   Hypermobility syndrome 09/30/2021   Absolute anemia 09/30/2021   Ephelides 07/08/2021   Inflamed seborrheic keratosis 07/08/2021   Iron deficiency 04/29/2021   Platelets decreased ([place]) 12/14/2020   Anxiety 11/10/2020   Elevated low density lipoprotein (LDL) cholesterol level 09/13/2019   Mild intermittent asthma without complication [id number]   Fibromyalgia 09/22/2018   Uterine leiomyoma 08/29/2018   Migraines 04/14/2018   Numbness and tingling of lower extremity 04/14/2018   Chronic myofascial pain 04/12/2018   Chronic pain disorder 04/12/2018   Lumbar facet joint pain 04/12/2018   Polyarthralgia 04/12/2018   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      Home Medications    Prior to Admission medications   Medication Sig Start Date End Date Taking? Authorizing Provider  albuterol (VENTOLIN HFA) 108 (90 Base) MCG/ACT inhaler Inhale 2 puffs into the lungs every  4 (four) hours as needed for wheezing or shortness of breath. 10/01/21   Kennith Gain, MD  EPINEPHrine 0.3 mg/0.3 mL IJ SOAJ injection Inject 0.3 mg into the muscle as needed for anaphylaxis. 10/01/21   Kennith Gain, MD  fluticasone (FLONASE) 50 MCG/ACT nasal spray Place 2 sprays into both nostrils daily as needed for allergies  or rhinitis. 11/04/20   Kennith Gain, MD  Iron-FA-B Cmp-C-Biot-Probiotic (FUSION PLUS) CAPS Take 1 capsule by mouth daily. Patient taking differently: Take 1 capsule by mouth every other day. 10/08/21   Celso Amy, NP  levocetirizine (XYZAL) 5 MG tablet Take 1 tablet (5 mg total) by mouth every evening. 10/01/21   Kennith Gain, MD  Magnesium 200 MG TABS Take 200 mg by mouth daily.    [provider]  Rimegepant Sulfate (NURTEC) 75 MG TBDP Take 1 tablet by mouth daily. 01/12/22   [provider]  sertraline (ZOLOFT) 25 MG tablet Take 1 tablet by mouth daily. 01/07/22   [provider]    Family History Family History  Problem Relation Age of Onset   Fibroids Mother    Brain cancer Father    Hypertension Maternal Grandmother    Stroke Maternal Grandmother    Thyroid disease Maternal Grandmother    Cancer Maternal Grandfather    Alzheimer's disease Maternal Grandfather    Prostate cancer Maternal Grandfather    Alzheimer's disease Paternal Grandmother    Allergic rhinitis Neg Hx    Angioedema Neg Hx    Asthma Neg Hx    Eczema Neg Hx    Immunodeficiency Neg Hx    Urticaria Neg Hx     Social History Social History   Tobacco Use   Smoking status: Never   Smokeless tobacco: Never  Vaping Use   Vaping Use: Never used  Substance Use Topics   Alcohol use: Not Currently   Drug use: Never     Allergies   Ferric carboxymaltose, Fish allergy, Fish oil, Imitrex [sumatriptan], Other, Peach flavor, Peanut oil, Peanut-containing drug products, Pollen extract, Rizatriptan, Shellfish-derived products, Cocoa, Gluten meal, Milk (cow), Eletriptan, Topamax [topiramate], Eletriptan hydrobromide, and Tape   Review of Systems Review of Systems   Physical Exam Triage Vital Signs ED Triage Vitals  Enc Vitals Group     BP 03/26/22 1232 (!) 147/109     Pulse Rate 03/26/22 1232 80     Resp 03/26/22 1232 16     Temp 03/26/22 1232 98.6 F  (37 C)     Temp Source 03/26/22 1232 Oral     SpO2 03/26/22 1232 98 %     Weight --      Height --      Head Circumference --      Peak Flow --      Pain Score 03/26/22 1233 3     Pain Loc --      Pain Edu? --      Excl. in [place]? --    No data found.  Updated Vital Signs BP (!) 147/109 (BP Location: Left Arm)   Pulse 80   Temp 98.6 F (37 C) (Oral)   Resp 16   SpO2 98%   Visual Acuity Right Eye Distance:   Left Eye Distance:   Bilateral Distance:    Right Eye Near:   Left Eye Near:    Bilateral Near:     Physical Exam Vitals reviewed.  Constitutional:      Appearance: Normal appearance. She is  not ill-appearing.  Cardiovascular:     Rate and Rhythm: Normal rate and regular rhythm.     Pulses: Normal pulses.     Heart sounds: Normal heart sounds.  Pulmonary:     Effort: Pulmonary effort is normal.     Breath sounds: Normal breath sounds.  Skin:    General: Skin is warm and dry.  Neurological:     General: No focal deficit present.     Mental Status: She is alert and oriented to person, place, and time.  Psychiatric:        Mood and Affect: Mood normal.        Behavior: Behavior normal.      UC Treatments / Results  Labs (all labs ordered are listed, but only abnormal results are displayed) Labs Reviewed - No data to display  EKG   Radiology CT Angio Chest PE W and/or Wo Contrast  Result Date: 03/24/2022 CLINICAL DATA:  Rule out pulmonary embolism. Positive D-dimer. Chest pain. EXAM: CT ANGIOGRAPHY CHEST WITH CONTRAST TECHNIQUE: Multidetector CT imaging of the chest was performed using the standard protocol during bolus administration of intravenous contrast. Multiplanar CT image reconstructions and MIPs were obtained to evaluate the vascular anatomy. RADIATION DOSE REDUCTION: This exam was performed according to the departmental dose-optimization program which includes automated exposure control, adjustment of the mA and/or kV according to patient size  and/or use of iterative reconstruction technique. CONTRAST:  65m OMNIPAQUE IOHEXOL 350 MG/ML SOLN COMPARISON:  None Available. FINDINGS: Cardiovascular: Satisfactory opacification of the pulmonary arteries to the segmental level. No evidence of pulmonary embolism. Normal heart size. No pericardial effusion. Mediastinum/Nodes: No enlarged mediastinal, hilar, or axillary lymph nodes. Thyroid gland, trachea, and esophagus demonstrate no significant findings. Lungs/Pleura: No pleural fluid or airspace consolidation. No atelectasis or signs of pneumothorax. No interstitial edema. Upper Abdomen: No acute abnormality. Musculoskeletal: No chest wall abnormality. No acute or significant osseous findings. Review of the MIP images confirms the above findings. IMPRESSION: No evidence for acute pulmonary embolism. No acute findings to explain patient's chest pain or shortness of breath. Electronically Signed   By: TKerby MoorsM.D.   On: 03/24/2022 14:01    Procedures Procedures (including critical care time)  Medications Ordered in UC Medications - No data to display  Initial Impression / Assessment and Plan / UC Course  I have reviewed the triage vital signs and the nursing notes.  Pertinent labs & imaging results that were available during my care of the patient were reviewed by me and considered in my medical decision making (see chart for details).   Patient is afebrile here without recent antipyretics. Satting well on room air. Overall is well appearing, well hydrated, without respiratory distress. Pulmonary exam is unremarkable.  Lungs CTAB without wheezing, rhonchi, rales.  UA is WNL and not indicative of UTI. Recommending f/up with her PCP and cardiology.  Final Clinical Impressions(s) / UC Diagnoses   Final diagnoses:  None   Discharge Instructions   None    ED Prescriptions   None    PDMP not reviewed this encounter.   IRose Phi FNP 03/26/22 1312    IRose Phi  FCraig03/02/24 1313

## 2022-03-29 ENCOUNTER — Ambulatory Visit (INDEPENDENT_AMBULATORY_CARE_PROVIDER_SITE_OTHER): Admitting: Family Medicine

## 2022-03-29 ENCOUNTER — Encounter: Payer: Self-pay | Admitting: Allergy

## 2022-03-29 ENCOUNTER — Encounter: Payer: Self-pay | Admitting: Cardiology

## 2022-03-29 ENCOUNTER — Encounter: Payer: Self-pay | Admitting: Family Medicine

## 2022-03-29 VITALS — BP 122/82 | Ht 63.0 in | Wt 112.0 lb

## 2022-03-29 DIAGNOSIS — I951 Orthostatic hypotension: Secondary | ICD-10-CM | POA: Diagnosis not present

## 2022-03-29 DIAGNOSIS — Q7962 Hypermobile Ehlers-Danlos syndrome: Secondary | ICD-10-CM | POA: Diagnosis not present

## 2022-03-29 DIAGNOSIS — D500 Iron deficiency anemia secondary to blood loss (chronic): Secondary | ICD-10-CM

## 2022-03-29 NOTE — Assessment & Plan Note (Signed)
Acute on chronic in nature.  Her most recent ferritin was again low at 13.  She has been on oral supplementation and continues to have tachycardia and lightheadedness.  She has follow-up with her hematologist in the next month.

## 2022-03-29 NOTE — Assessment & Plan Note (Signed)
Acutely occurring over the past few weeks.  Having chest pain and lightheadedness that seems more positional.  She also reports elevated blood pressure and tachycardia.  Her hypermobility is likely playing a role.  She does have follow-up with her cardiologist soon.

## 2022-03-29 NOTE — Progress Notes (Signed)
  Theresa Cruz - 37 y.o. female MRN [id number]  Date of birth: April 08, 1985  SUBJECTIVE:  Including CC & ROS.  No chief complaint on file.   Theresa Cruz is a 37 y.o. female that is presenting with chest pain, lightheadedness and irregularities of her heart rate.  She also reports intermittent headaches and migraines.  Her most recent ferritin was again low after trying a trial of oral supplementation.   Review of Systems See HPI   HISTORY: Past Medical, Surgical, Social, and Family History Reviewed & Updated per EMR.   Pertinent Historical Findings include:  Past Medical History:  Diagnosis Date   Allergic conjunctivitis 05/19/2016   Allergy    Asthma    Fibromyalgia 09/22/2018   GAD (generalized anxiety disorder) 12/08/2021   Hypermobility syndrome 09/30/2021   IDA (iron deficiency anemia) 10/04/2021   Migraines 04/14/2018   Numbness and tingling of lower extremity 04/14/2018   Obsessive compulsive disorder 12/08/2021   Perennial and seasonal allergic rhinitis 05/19/2016   Seafood allergy 03/15/2011   Last Assessment & Plan: Formatting of this note might be different from the original. Counseled on avoidance. She will bring benadryl and Epipen to [place]. She has allergy alert cards written in mandarin to bring. Formatting of this note might be different from the original. Last Assessment & Plan: Formatting of this note might be different from the original. Counseled on avoidance. She will bring    Urticaria     Past Surgical History:  Procedure Laterality Date   NO PAST SURGERIES       PHYSICAL EXAM:  VS: BP 122/82   Ht '5\' 3"'$  (1.6 m)   Wt 112 lb (50.8 kg)   BMI 19.84 kg/m  Physical Exam Gen: NAD, alert, cooperative with exam, well-appearing MSK:  Neurovascularly intact       ASSESSMENT & PLAN:   Ehlers-Danlos syndrome type III She likely has a component of dysautonomia with her most recent symptoms of chest pain, lightheadedness and tachycardia.   Previous stress testing and Holter monitor has not been revealing.  Advised to continue with follow-up with cardiologist  IDA (iron deficiency anemia) Acute on chronic in nature.  Her most recent ferritin was again low at 13.  She has been on oral supplementation and continues to have tachycardia and lightheadedness.  She has follow-up with her hematologist in the next month.  Dysautonomia orthostatic hypotension syndrome Acutely occurring over the past few weeks.  Having chest pain and lightheadedness that seems more positional.  She also reports elevated blood pressure and tachycardia.  Her hypermobility is likely playing a role.  She does have follow-up with her cardiologist soon.

## 2022-03-29 NOTE — Assessment & Plan Note (Signed)
She likely has a component of dysautonomia with her most recent symptoms of chest pain, lightheadedness and tachycardia.  Previous stress testing and Holter monitor has not been revealing.  Advised to continue with follow-up with cardiologist

## 2022-03-29 NOTE — Patient Instructions (Signed)
Good to see you Please ask about the nortriptyline   You can consider sock compression to help with light headedness  Please send me a message in MyChart with any questions or updates.  Please see me back as needed.   --Dr. Raeford Razor

## 2022-04-05 ENCOUNTER — Ambulatory Visit: Admitting: Cardiology

## 2022-04-06 ENCOUNTER — Other Ambulatory Visit: Payer: Self-pay

## 2022-04-06 ENCOUNTER — Ambulatory Visit: Admitting: Allergy

## 2022-04-06 ENCOUNTER — Encounter: Payer: Self-pay | Admitting: Allergy

## 2022-04-06 VITALS — BP 120/76 | HR 82 | Temp 98.5°F | Resp 18 | Ht 63.0 in | Wt 112.9 lb

## 2022-04-06 DIAGNOSIS — H1013 Acute atopic conjunctivitis, bilateral: Secondary | ICD-10-CM

## 2022-04-06 DIAGNOSIS — T7840XD Allergy, unspecified, subsequent encounter: Secondary | ICD-10-CM

## 2022-04-06 DIAGNOSIS — T781XXD Other adverse food reactions, not elsewhere classified, subsequent encounter: Secondary | ICD-10-CM

## 2022-04-06 DIAGNOSIS — T50905D Adverse effect of unspecified drugs, medicaments and biological substances, subsequent encounter: Secondary | ICD-10-CM

## 2022-04-06 DIAGNOSIS — J3089 Other allergic rhinitis: Secondary | ICD-10-CM

## 2022-04-06 DIAGNOSIS — Z889 Allergy status to unspecified drugs, medicaments and biological substances status: Secondary | ICD-10-CM

## 2022-04-06 DIAGNOSIS — J4520 Mild intermittent asthma, uncomplicated: Secondary | ICD-10-CM

## 2022-04-06 DIAGNOSIS — T7800XD Anaphylactic reaction due to unspecified food, subsequent encounter: Secondary | ICD-10-CM

## 2022-04-06 DIAGNOSIS — L509 Urticaria, unspecified: Secondary | ICD-10-CM

## 2022-04-06 MED ORDER — RYALTRIS 665-25 MCG/ACT NA SUSP: NASAL | 5 refills | Status: DC

## 2022-04-06 MED ORDER — LEVOCETIRIZINE DIHYDROCHLORIDE 5 MG PO TABS: 5.0000 mg | ORAL_TABLET | Freq: Every evening | ORAL | 1 refills | Status: DC

## 2022-04-06 MED ORDER — OLOPATADINE HCL 0.2 % OP SOLN: 1.0000 [drp] | Freq: Every day | OPHTHALMIC | 5 refills | Status: AC | PRN

## 2022-04-06 MED ORDER — CROMOLYN SODIUM 100 MG/5ML PO CONC: 100.0000 mg | Freq: Every day | ORAL | 2 refills | Status: DC

## 2022-04-06 NOTE — Patient Instructions (Addendum)
[facility] -Diagnosed with hypermobility syndrome -You do have IgE mediated allergy issues (as below) however also experience reaction type symptoms despite any known triggers -Tryptase screening for mastocytosis has been normal.  Tryptase obtained during episodes has also been normal.  This would argue against a frank mastocytosis diagnosis.  -Take levocetrizine daily at this time -Start Cromolyn '100mg'$ /31m take once a day at this time taken before a meal.  Start with 574m(standard dosing is '200mg'$  or 1073mdaily.  If tolerating then increase to 42m75mily.  If effective then can increase as needed to 1-3 times a day with meals.  Cromolyn is a mast cell (allergy cell) stabilizer medication.  It helps mostly with GI symptom control.   -Have access to epinephrine device in case of reaction  Adverse effect of medication -Reaction with iron infusion. Would not use that product again.  There are other iron infusion products.  Will contact your hematology provider regarding future infusion.  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  All of these levels were very low except for the cashew.  -I do believe you have a component of oral allergy syndrome with fresh or raw fruits related to your pollen allergy (see below) -There could be an irritant effect (like strong odors or noxious odors) can irritate the airway leading to symptoms there could be a role here of airborne food particles that may be inhaled irritating the airway -Have access to self-injectable epinephrine (Epipen or AuviQ) 0.'3mg'$  at all times -Follow emergency action plan in case of allergic reaction -Xolair monthly injections  discussed today as management option.  Xolair is now indicated for food allergy management.   -We have discussed the following in regards to foods:   Allergy: food allergy is when you have eaten a food, developed an allergic reaction after eating the food and have IgE to the food (positive food testing either by skin testing or blood testing).  Food allergy could lead to life threatening symptoms  Sensitivity: occurs when you have IgE to a food (positive food testing either by skin testing or blood testing) but is a food you eat without any issues.  This is not an allergy and we recommend keeping the food in the diet  Intolerance: this is when you have negative testing by either skin testing or blood testing thus not allergic but the food causes symptoms (like belly pain, bloating, diarrhea etc) with ingestion.  These foods should be avoided to prevent symptoms.    Asthma -Have access to albuterol inhaler 2 puffs every 4-6 hours as needed for cough/wheeze/shortness of breath/chest tightness.  May use 15-20 minutes prior to activity.   Monitor frequency of use.    Allergic rhinitis -Continue avoidance measures for dust mite, dog dander, grass pollen, tree pollen, weed pollen, ragweed pollen, and mold.  -Continue Xyzal 5 mg once a day  -Use Ryaltris 2 sprays each nostril twice a day as needed for runny or stuffy nose.  This is a combination spray with mometasone (nasal steroid for congestion control) and olopatadine (nasal antihistamine for drainage control).  This goes to a specialty pharmacy.  Sample provided today.  -Consider saline nasal rinses as needed for nasal symptoms. Use this before any medicated  nasal sprays for best result  Allergic conjunctivitis -Use Pataday one drop in each eye once a day as needed for red, itchy eyes  Urticaria (hives) - use Antihistamines as above - you can use Hydroxyzine when needed for hive control.  This medication is usually sedating.   - Discussed Xolair  as a way to better manage hives.  Will have Tammy, our nurse coordinator, contact you regarding this medication.    Follow up in 6 months or sooner if needed.

## 2022-04-06 NOTE — Progress Notes (Signed)
Follow-up Note  RE: Theresa Cruz MRN: [id number] DOB: Jan 12, 1986 Date of Office Visit: 04/06/2022   History of present illness: Theresa Cruz is a 37 y.o. female presenting today for follow-up of adverse food reaction -food allergy and oral allergy syndrome, allergic rhinitis with conjunctivitis and asthma.  She was last seen in the office on 10/21/2020 by myself. She has been dealing with a lot of symptoms lately.  She has currently been awaiting a new cardiology appointment to further evaluate for POTS.  She has been having episodes of feeling palpitations and heart racing as well as feeling lightheaded.  She has had evaluation with Holter monitor last year that was essentially unremarkable.  She also has been following with a sports medicine physician to help with diagnosis of EDS with component of dysautonomia.   She states she is currently feeling light headed and having palpitations.  She states these episodes can just happen randomly.  She had a recent ED visit on 03/24/2022 for chest pain, palpitations.  On exam the chest wall was noted to have tenderness.  D-dimer was very slightly elevated from the upper limit of normal.  EKG did not show STEMI.  She did develop hives at the site of the pressure cuff due to pressure.  She states she was already having hives prior to the blood pressure reading.  She did have a CTPE rule out but was unremarkable.   She states she also has been diagnosed with ADD, OCD, OCPD, depressive disorder.  She was started on zoloft and has a prescription for hydroxyzine.  She does feel like the Zoloft is helpful.  She has not started taking the hydroxyzine.  She states she was advised to not take any other antihistamines with hydroxyzine.  Hydroxyzine was recommended for sleep.  She is quite sensitive to variety of different medications.  She states with the Benadryl she has these sedation effects 4 days after use.  She is worried that she can have a similar  affect with the hydroxyzine as to why she has not tried this medication yet.   She is seeing a hematologist provider Lottie Dawson, NP) for her iron deficiency anemia.  She is quite fatigued and has had low ferritin and iron stores.  She has had an iron infusion before and had a reaction.  They would like to resume iron infusions to help with her iron stores and improve her symptoms. She states she is not really eating much.  She has had 4 meals this week.  She just does not have an appetite.  She is often having GI symptoms with abdominal pain or nausea.  She also has had changes with her stool patterns. She has had episodic hives.  She does avoid a variety of foods that she has had positive food allergy testing.  She does have access to an epinephrine device which she has not needed to use. She does have environmental allergies as she does have a positive testing for dust mites, dog, grass pollen, tree pollen, weed pollen, mold.  She has taken levocetirizine in the past and this has been effective and tolerating antihistamine for her.  She has had issues with runny and stuffy nose and I recommended she try use of Ryaltris in the past. She also has access to an albuterol inhaler rescue use.   Review of systems: Review of Systems  Constitutional:  Positive for activity change, appetite change and fatigue.  HENT: Negative.    Eyes: Negative.  Respiratory: Negative.    Cardiovascular:  Positive for chest pain and palpitations.  Gastrointestinal:  Positive for abdominal pain and nausea.  Musculoskeletal: Negative.   Skin:  Positive for rash.  Allergic/Immunologic: Negative.   Neurological:  Positive for weakness and light-headedness.     All other systems negative unless noted above in HPI  Past medical/social/surgical/family history have been reviewed and are unchanged unless specifically indicated below.  No changes  Medication List: Current Outpatient Medications  Medication Sig  Dispense Refill   albuterol (VENTOLIN HFA) 108 (90 Base) MCG/ACT inhaler Inhale 2 puffs into the lungs every 4 (four) hours as needed for wheezing or shortness of breath. 18 g 1   cromolyn (GASTROCROM) 100 MG/5ML solution Take 5 mLs (100 mg total) by mouth daily before breakfast. 600 mL 2   EPINEPHrine 0.3 mg/0.3 mL IJ SOAJ injection Inject 0.3 mg into the muscle as needed for anaphylaxis. 1 each 2   fluticasone (FLONASE) 50 MCG/ACT nasal spray Place 2 sprays into both nostrils daily as needed for allergies or rhinitis. 16 g 5   Iron-FA-B Cmp-C-Biot-Probiotic (FUSION PLUS) CAPS Take 1 capsule by mouth daily. (Patient taking differently: Take 1 capsule by mouth every other day.) 30 capsule 6   Olopatadine HCl (PATADAY) 0.2 % SOLN Place 1 drop into both eyes daily as needed. 2.5 mL 5   Olopatadine-Mometasone (RYALTRIS) 665-25 MCG/ACT SUSP 2 sprays each nostril twice a day as needed for runny or stuffy nose. 29 g 5   Rimegepant Sulfate (NURTEC) 75 MG TBDP Take 1 tablet by mouth as directed. Every other day     sertraline (ZOLOFT) 25 MG tablet Take 1 tablet by mouth daily.     levocetirizine (XYZAL) 5 MG tablet Take 1 tablet (5 mg total) by mouth every evening. 90 tablet 1   Magnesium 200 MG TABS Take 200 mg by mouth daily. (Patient not taking: Reported on 04/06/2022)     No current facility-administered medications for this visit.     Known medication allergies: Allergies  Allergen Reactions   Ferric Carboxymaltose Anaphylaxis, Shortness Of Breath and Nausea And Vomiting   Fish Allergy Hives and Itching   Fish Oil Itching   Imitrex [Sumatriptan] Other (See Comments)    Muscle tension   Other Hives    Tree nuts   Peach Flavor Hives and Shortness Of Breath   Peanut Oil Hives and Itching   Peanut-Containing Drug Products Hives   Pollen Extract Shortness Of Breath   Rizatriptan Other (See Comments)    Muscles tightening   Shellfish-Derived Products Anaphylaxis   Cocoa Other (See Comments)     Migraine   Gluten Meal Nausea And Vomiting and Other (See Comments)    Migraine   Milk (Cow) Nausea And Vomiting   Eletriptan Other (See Comments)   Topamax [Topiramate]    Eletriptan Hydrobromide Other (See Comments)   Tape Rash     Physical examination: Blood pressure 120/76, pulse 82, temperature 98.5 F (36.9 C), resp. rate 18, height '5\' 3"'$  (1.6 m), weight 112 lb 14.4 oz (51.2 kg), SpO2 98 %.  General: Alert, interactive, in mild distress. HEENT: PERRLA, TMs pearly gray, turbinates moderately edematous without discharge, post-pharynx non erythematous. Neck: Supple without lymphadenopathy. Lungs: Clear to auscultation without wheezing, rhonchi or rales. {no increased work of breathing. CV: Normal S1, S2 without murmurs. Abdomen: Nondistended, nontender. Skin: Warm and dry, without lesions or rashes. Extremities:  No clubbing, cyanosis or edema. Neuro:   Grossly intact.  Blood pressure remained  stable on recheck on discharge  Diagnositics/Labs: None today  Assessment and plan: [facility] -Diagnosed with hypermobility syndrome -You do have IgE mediated allergy issues (as below) however also experience reaction type symptoms despite any known triggers -Tryptase screening for mastocytosis has been normal.  Tryptase obtained during episodes has also been normal.  This would argue against a frank mastocytosis diagnosis.  -Take levocetrizine daily at this time -Start Cromolyn '100mg'$ /19m take once a day at this time taken before a meal.  Start with 573m(standard dosing is '200mg'$  or 1056mdaily.  If tolerating then increase to 9m21mily.  If effective then can increase as needed to 1-3 times a day with meals.  Cromolyn is a mast cell (allergy cell) stabilizer medication.  It helps mostly with GI symptom control.   -Have access to epinephrine device in case of reaction  Adverse effect of medication -Reaction with iron infusion. Would not use that product again.  There are other iron  infusion products.  Will contact your hematology provider regarding future infusion.  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  All of these levels were very low except for the cashew.  -I do believe you have a component of oral allergy syndrome with fresh or raw fruits related to your pollen allergy (see below) -There could be an irritant effect (like strong odors or noxious odors) can irritate the airway leading to symptoms there could be a role here of airborne food particles that may be inhaled irritating the airway -Have access to self-injectable epinephrine (Epipen or AuviQ) 0.'3mg'$  at all times -Follow emergency action plan in case of allergic reaction -Xolair monthly injections discussed today as management option.  Xolair is now indicated for food allergy management.   -We have discussed the following in regards to foods:   Allergy: food allergy is when you have eaten a food, developed an allergic reaction after eating the food and have IgE to the food (positive food testing either by skin testing or blood testing).  Food allergy could lead to life threatening symptoms  Sensitivity: occurs when you have IgE to a food (positive food testing either by skin testing or blood testing) but is a food you eat without any issues.  This is not an allergy and we recommend keeping the food in the diet  Intolerance: this is when you have negative testing by either skin testing or blood testing thus not allergic but the food causes symptoms (like belly pain, bloating, diarrhea etc) with ingestion.  These foods should be avoided to prevent symptoms.    Asthma -Have access to albuterol  inhaler 2 puffs every 4-6 hours as needed for cough/wheeze/shortness of breath/chest tightness.  May use 15-20 minutes prior to activity.   Monitor frequency of use.    Allergic rhinitis -Continue avoidance measures for dust mite, dog dander, grass pollen, tree pollen, weed pollen, ragweed pollen, and mold.  -Continue Xyzal 5 mg once a day  -Use Ryaltris 2 sprays each nostril twice a day as needed for runny or stuffy nose.  This is a combination spray with mometasone (nasal steroid for congestion control) and olopatadine (nasal antihistamine for drainage control).  This goes to a specialty pharmacy.  Sample provided today.  -  Consider saline nasal rinses as needed for nasal symptoms. Use this before any medicated nasal sprays for best result  Allergic conjunctivitis -Use Pataday one drop in each eye once a day as needed for red, itchy eyes  Urticaria (hives) - use Antihistamines as above - you can use Hydroxyzine when needed for hive control.  This medication is usually sedating.   - Discussed Xolair as a way to better manage hives.  Will have Tammy, our nurse coordinator, contact you regarding this medication.    Follow up in 6 months or sooner if needed.  I appreciate the opportunity to take part in Theresa Cruz's care. Please do not hesitate to contact me with questions.  Sincerely,   Prudy Feeler, MD Allergy/Immunology [facility]

## 2022-04-11 ENCOUNTER — Inpatient Hospital Stay (HOSPITAL_BASED_OUTPATIENT_CLINIC_OR_DEPARTMENT_OTHER): Admitting: Family

## 2022-04-11 ENCOUNTER — Encounter: Payer: Self-pay | Admitting: Family

## 2022-04-11 ENCOUNTER — Inpatient Hospital Stay: Attending: Hematology & Oncology

## 2022-04-11 VITALS — BP 112/76 | HR 72 | Temp 99.0°F | Resp 17 | Wt 115.0 lb

## 2022-04-11 DIAGNOSIS — R0602 Shortness of breath: Secondary | ICD-10-CM | POA: Diagnosis not present

## 2022-04-11 DIAGNOSIS — R200 Anesthesia of skin: Secondary | ICD-10-CM | POA: Insufficient documentation

## 2022-04-11 DIAGNOSIS — F429 Obsessive-compulsive disorder, unspecified: Secondary | ICD-10-CM | POA: Insufficient documentation

## 2022-04-11 DIAGNOSIS — D500 Iron deficiency anemia secondary to blood loss (chronic): Secondary | ICD-10-CM

## 2022-04-11 DIAGNOSIS — R5383 Other fatigue: Secondary | ICD-10-CM | POA: Insufficient documentation

## 2022-04-11 DIAGNOSIS — Z79899 Other long term (current) drug therapy: Secondary | ICD-10-CM | POA: Diagnosis not present

## 2022-04-11 DIAGNOSIS — D509 Iron deficiency anemia, unspecified: Secondary | ICD-10-CM | POA: Diagnosis present

## 2022-04-11 DIAGNOSIS — R110 Nausea: Secondary | ICD-10-CM | POA: Diagnosis not present

## 2022-04-11 DIAGNOSIS — R531 Weakness: Secondary | ICD-10-CM | POA: Insufficient documentation

## 2022-04-11 DIAGNOSIS — T782XXA Anaphylactic shock, unspecified, initial encounter: Secondary | ICD-10-CM | POA: Insufficient documentation

## 2022-04-11 DIAGNOSIS — F909 Attention-deficit hyperactivity disorder, unspecified type: Secondary | ICD-10-CM | POA: Diagnosis not present

## 2022-04-11 DIAGNOSIS — R202 Paresthesia of skin: Secondary | ICD-10-CM | POA: Insufficient documentation

## 2022-04-11 DIAGNOSIS — R079 Chest pain, unspecified: Secondary | ICD-10-CM | POA: Diagnosis not present

## 2022-04-11 DIAGNOSIS — R42 Dizziness and giddiness: Secondary | ICD-10-CM | POA: Diagnosis not present

## 2022-04-11 DIAGNOSIS — I10 Essential (primary) hypertension: Secondary | ICD-10-CM | POA: Insufficient documentation

## 2022-04-11 LAB — CBC WITH DIFFERENTIAL (CANCER CENTER ONLY)
Abs Immature Granulocytes: 0.01 10*3/uL (ref 0.00–0.07)
Basophils Absolute: 0 10*3/uL (ref 0.0–0.1)
Basophils Relative: 1 %
Eosinophils Absolute: 0.1 10*3/uL (ref 0.0–0.5)
Eosinophils Relative: 2 %
HCT: 36 % (ref 36.0–46.0)
Hemoglobin: 12.1 g/dL (ref 12.0–15.0)
Immature Granulocytes: 0 %
Lymphocytes Relative: 29 %
Lymphs Abs: 1.5 10*3/uL (ref 0.7–4.0)
MCH: 31.3 pg (ref 26.0–34.0)
MCHC: 33.6 g/dL (ref 30.0–36.0)
MCV: 93.3 fL (ref 80.0–100.0)
Monocytes Absolute: 0.4 10*3/uL (ref 0.1–1.0)
Monocytes Relative: 8 %
Neutro Abs: 3.1 10*3/uL (ref 1.7–7.7)
Neutrophils Relative %: 60 %
Platelet Count: 173 10*3/uL (ref 150–400)
RBC: 3.86 MIL/uL — ABNORMAL LOW (ref 3.87–5.11)
RDW: 13 % (ref 11.5–15.5)
WBC Count: 5 10*3/uL (ref 4.0–10.5)
nRBC: 0 % (ref 0.0–0.2)

## 2022-04-11 LAB — IRON AND IRON BINDING CAPACITY (CC-WL,HP ONLY)
Iron: 64 ug/dL (ref 28–170)
Saturation Ratios: 19 % (ref 10.4–31.8)
TIBC: 337 ug/dL (ref 250–450)
UIBC: 273 ug/dL (ref 148–442)

## 2022-04-11 LAB — RETICULOCYTES
Immature Retic Fract: 11.1 % (ref 2.3–15.9)
RBC.: 3.82 MIL/uL — ABNORMAL LOW (ref 3.87–5.11)
Retic Count, Absolute: 32.1 10*3/uL (ref 19.0–186.0)
Retic Ct Pct: 0.8 % (ref 0.4–3.1)

## 2022-04-11 LAB — FERRITIN: Ferritin: 10 ng/mL — ABNORMAL LOW (ref 11–307)

## 2022-04-11 NOTE — Progress Notes (Signed)
Hematology and Oncology Follow Up Visit  Theresa Cruz [id number] 11/10/1985 37 y.o. 04/11/2022   Principle Diagnosis:  Iron deficiency anemia    Current Therapy:        Fusion plus 1 capsule PO daily - has been off the last 2 weeks   Of note, patient had anaphylactic reaction with Injectafer   Interim History:  Theresa Cruz is here today for follow-up. She is symptomatic with fatigue, weakness, chest discomfort, lightheadedness, muscle aches and occasional SOB.  She has been in and out of urgent care recently due to HTN and chest pain with episodes of nausea and dizziness.  She has been treated for an ear infection.  She states that she is seeing another cardiologist for further work up for POTS.  She was aslo recently diagnosed with ADHD, OCD, OCPD and GAD.  She stopped taking her Zoloft and Fusion plus 2 weeks ago.  Her cycle is regular and has been heavy the last couple days recently. Cycle has also been lasting 1-2 days longer.  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 She has had several rashes pop up where blood pressure cuffs have been placed.  With her history of anaphylaxis with injectafer I discussed future treatments with her allergist. We will premedicate with benadryl, solumedrol and Pepcid all IV. We will also slow her rate of Feraheme.  Patient is in agreement with the plan if IV iron is needed.  She has had abdominal pain and will be starting treatment with Gastrocom.  She also notes feeling of warmth and heaviness in her fingers that comes and goes.  No fever, chills, vomiting, cough or changes in bowel or bladder habits.  No swelling noted in her extremities at this time.  She has positional numbness and tingling in her legs and feet when sitting.  No falls or syncope reported.  Appetite and hydration are fair. Weight is 115 lbs.   ECOG Performance Status: 1 - Symptomatic but completely ambulatory  Medications:  Allergies as of 04/11/2022        Reactions   Ferric Carboxymaltose Anaphylaxis, Shortness Of Breath, Nausea And Vomiting   Fish Allergy Hives, Itching   Fish Oil Itching   Imitrex [sumatriptan] Other (See Comments)   Muscle tension   Other Hives   Tree nuts   Peach Flavor Hives, Shortness Of Breath   Peanut Oil Hives, Itching   Peanut-containing Drug Products Hives   Pollen Extract Shortness Of Breath   Rizatriptan Other (See Comments)   Muscles tightening   Shellfish-derived Products Anaphylaxis   Cocoa Other (See Comments)   Migraine   Gluten Meal Nausea And Vomiting, Other (See Comments)   Migraine   Milk (cow) Nausea And Vomiting   Eletriptan Other (See Comments)   Topamax [topiramate]    Eletriptan Hydrobromide Other (See Comments)   Tape Rash        Medication List        Accurate as of April 11, 2022  1:20 PM. If you have any questions, ask your nurse or doctor.          albuterol 108 (90 Base) MCG/ACT inhaler Commonly known as: VENTOLIN HFA Inhale 2 puffs into the lungs every 4 (four) hours as needed for wheezing or shortness of breath.   cromolyn 100 MG/5ML solution Commonly known as: GASTROCROM Take 5 mLs (100 mg total) by mouth daily before breakfast.   diphenhydrAMINE 25 mg capsule Commonly known as: BENADRYL Take 12.5 mg by mouth  at bedtime as needed.   EPINEPHrine 0.3 mg/0.3 mL Soaj injection Commonly known as: EPI-PEN Inject 0.3 mg into the muscle as needed for anaphylaxis.   fluticasone 50 MCG/ACT nasal spray Commonly known as: FLONASE Place 2 sprays into both nostrils daily as needed for allergies or rhinitis.   Fusion Plus Caps Take 1 capsule by mouth daily.   hydrOXYzine 10 MG tablet Commonly known as: ATARAX Take 10 mg by mouth at bedtime. As needed   levocetirizine 5 MG tablet Commonly known as: XYZAL Take 1 tablet (5 mg total) by mouth every evening.   Magnesium 200 MG Tabs Take 200 mg by mouth daily.   Nurtec 75 MG Tbdp Generic drug: Rimegepant  Sulfate Take 1 tablet by mouth as directed. Every other day   Olopatadine HCl 0.2 % Soln Commonly known as: Pataday Place 1 drop into both eyes daily as needed.   Ryaltris [id number] MCG/ACT Susp Generic drug: Olopatadine-Mometasone 2 sprays each nostril twice a day as needed for runny or stuffy nose.   sertraline 25 MG tablet Commonly known as: ZOLOFT Take 1 tablet by mouth daily.        Allergies:  Allergies  Allergen Reactions   Ferric Carboxymaltose Anaphylaxis, Shortness Of Breath and Nausea And Vomiting   Fish Allergy Hives and Itching   Fish Oil Itching   Imitrex [Sumatriptan] Other (See Comments)    Muscle tension   Other Hives    Tree nuts   Peach Flavor Hives and Shortness Of Breath   Peanut Oil Hives and Itching   Peanut-Containing Drug Products Hives   Pollen Extract Shortness Of Breath   Rizatriptan Other (See Comments)    Muscles tightening   Shellfish-Derived Products Anaphylaxis   Cocoa Other (See Comments)    Migraine   Gluten Meal Nausea And Vomiting and Other (See Comments)    Migraine   Milk (Cow) Nausea And Vomiting   Eletriptan Other (See Comments)   Topamax [Topiramate]    Eletriptan Hydrobromide Other (See Comments)   Tape Rash    Past Medical History, Surgical history, Social history, and Family History were reviewed and updated.  Review of Systems: All other 10 point review of systems is negative.   Physical Exam:  weight is 115 lb (52.2 kg). Her oral temperature is 99 F (37.2 C). Her blood pressure is 112/76 and her pulse is 72. Her respiration is 17 and oxygen saturation is 100%.   Wt Readings from Last 3 Encounters:  04/11/22 115 lb (52.2 kg)  04/06/22 112 lb 14.4 oz (51.2 kg)  03/29/22 112 lb (50.8 kg)    Ocular: Sclerae unicteric, pupils equal, round and reactive to light Ear-nose-throat: Oropharynx clear, dentition fair Lymphatic: No cervical or supraclavicular adenopathy Lungs no rales or rhonchi, good excursion  bilaterally Heart regular rate and rhythm, no murmur appreciated Abd soft, nontender, positive bowel sounds MSK no focal spinal tenderness, no joint edema Neuro: non-focal, well-oriented, appropriate affect Breasts: Deferred   Lab Results  Component Value Date   WBC 5.0 04/11/2022   HGB 12.1 04/11/2022   HCT 36.0 04/11/2022   MCV 93.3 04/11/2022   PLT 173 04/11/2022   Lab Results  Component Value Date   FERRITIN 21 12/13/2021   IRON 64 04/11/2022   TIBC 337 04/11/2022   UIBC 273 04/11/2022   IRONPCTSAT 19 04/11/2022   Lab Results  Component Value Date   RETICCTPCT 0.8 04/11/2022   RBC 3.82 (L) 04/11/2022   No results found for: "KPAFRELGTCHN", "LAMBDASER", "  KAPLAMBRATIO" No results found for: "IGGSERUM", "IGA", "IGMSERUM" No results found for: "TOTALPROTELP", "ALBUMINELP", "A1GS", "A2GS", "BETS", "BETA2SER", "GAMS", "MSPIKE", "SPEI"   Chemistry      Component Value Date/Time   NA 136 03/24/2022 0804   K 3.5 03/24/2022 0804   CL 102 03/24/2022 0804   CO2 26 03/24/2022 0804   BUN 15 03/24/2022 0804   CREATININE 0.74 03/24/2022 0804   CREATININE 0.78 10/04/2021 1037   CREATININE 0.66 11/12/2015 1439      Component Value Date/Time   CALCIUM 9.2 03/24/2022 0804   ALKPHOS 33 (L) 03/24/2022 0941   AST 22 03/24/2022 0941   AST 17 10/04/2021 1037   ALT 12 03/24/2022 0941   ALT 9 10/04/2021 1037   BILITOT 0.7 03/24/2022 0941   BILITOT 0.7 10/04/2021 1037       Impression and Plan: Theresa Cruz is a very pleasant 37 yo African American female with history of iron deficiency anemia.  Iron studies are pending. We will premedicate and give IV iron at slow rate if she needs replacement.  Follow-up in 6 months.   Lottie Dawson, NP 3/18/20241:20 PM

## 2022-04-12 ENCOUNTER — Telehealth: Payer: Self-pay | Admitting: *Deleted

## 2022-04-12 ENCOUNTER — Other Ambulatory Visit: Payer: Self-pay | Admitting: Family

## 2022-04-12 NOTE — Addendum Note (Signed)
Addended by: Chip Boer R on: 04/12/2022 01:05 PM   Modules accepted: Orders

## 2022-04-12 NOTE — Telephone Encounter (Signed)
Kennith Gain, MD  [facility] Please let pt know I talked with Tammy in regards to [facility] for her and the only testing criteria we will need updated at this time is a total IgE level as this is used to determine dosing.  If she can at her earliest convenience have a total IgE drawn that would be great.    Please order total IgE level.     Called patient and advised of lab needed, patient verbalized understanding and will come by to get labs drawn. Labs have been printed and given to [place].

## 2022-04-21 ENCOUNTER — Ambulatory Visit: Admitting: Cardiology

## 2022-04-21 LAB — IGE: IgE (Immunoglobulin E), Serum: 609 IU/mL — ABNORMAL HIGH (ref 6–495)

## 2022-04-22 ENCOUNTER — Encounter: Payer: Self-pay | Admitting: Family

## 2022-04-22 ENCOUNTER — Encounter: Payer: Self-pay | Admitting: Allergy

## 2022-04-22 ENCOUNTER — Inpatient Hospital Stay

## 2022-04-22 VITALS — BP 102/68 | HR 75 | Temp 98.0°F | Resp 18 | Ht 63.0 in | Wt 115.0 lb

## 2022-04-22 DIAGNOSIS — D509 Iron deficiency anemia, unspecified: Secondary | ICD-10-CM | POA: Diagnosis not present

## 2022-04-22 DIAGNOSIS — D500 Iron deficiency anemia secondary to blood loss (chronic): Secondary | ICD-10-CM

## 2022-04-22 MED ORDER — FAMOTIDINE IN NACL 20-0.9 MG/50ML-% IV SOLN
20.0000 mg | INTRAVENOUS | Status: AC
  Administered 2022-04-22 (×2): 20 mg via INTRAVENOUS
  Filled 2022-04-22: qty 50

## 2022-04-22 MED ORDER — SODIUM CHLORIDE 0.9 % IV SOLN: Freq: Once | INTRAVENOUS | Status: AC

## 2022-04-22 MED ORDER — LORAZEPAM 2 MG/ML IJ SOLN
0.5000 mg | Freq: Once | INTRAMUSCULAR | Status: AC
  Administered 2022-04-22: 0.5 mg via INTRAVENOUS
  Filled 2022-04-22: qty 1

## 2022-04-22 MED ORDER — DIPHENHYDRAMINE HCL 50 MG/ML IJ SOLN
50.0000 mg | Freq: Once | INTRAMUSCULAR | Status: AC
  Administered 2022-04-22: 50 mg via INTRAVENOUS
  Filled 2022-04-22: qty 1

## 2022-04-22 MED ORDER — SODIUM CHLORIDE 0.9 % IV SOLN: 40.0000 mg | Freq: Once | INTRAVENOUS | Status: DC

## 2022-04-22 MED ORDER — SODIUM CHLORIDE 0.9 % IV SOLN
510.0000 mg | Freq: Once | INTRAVENOUS | Status: AC
  Administered 2022-04-22: 510 mg via INTRAVENOUS
  Filled 2022-04-22: qty 17

## 2022-04-22 MED ORDER — METHYLPREDNISOLONE SODIUM SUCC 125 MG IJ SOLR
125.0000 mg | Freq: Once | INTRAMUSCULAR | Status: AC
  Administered 2022-04-22: 125 mg via INTRAVENOUS
  Filled 2022-04-22: qty 2

## 2022-04-22 NOTE — Progress Notes (Signed)
Patient given Ativan, benadryl, solumedrol and pepcid. Then stated her chest felt tight, BP wnl. Then c/o of abdominal cramping. Assisted to BR via W/C where she had a BM and vomited. Then c/o bleeding from her vagina. States it was not time for her period. Patient asked for heating pad for her abdomin. Given 2 hot packs. Suggested she contact her OB-GYN re: vaginal bleeding. Patient states all of this happened when she received pre-meds, the last time she got iron at [facility]. Feraheme ran over 90 minutes, with no reaction.VSS

## 2022-04-22 NOTE — Patient Instructions (Signed)

## 2022-04-25 ENCOUNTER — Telehealth: Payer: Self-pay | Admitting: Family

## 2022-04-25 NOTE — Telephone Encounter (Signed)
I called the patient twice and the phone would pick up but no answer and no opportunity given to leave a voicemail. I also left a voicemail on her mother Vivian's personal phone. Will try again tomorrow.

## 2022-04-26 ENCOUNTER — Other Ambulatory Visit: Payer: Self-pay | Admitting: Family

## 2022-04-26 ENCOUNTER — Telehealth: Payer: Self-pay | Admitting: Family

## 2022-04-26 ENCOUNTER — Encounter: Payer: Self-pay | Admitting: Family Medicine

## 2022-04-26 DIAGNOSIS — D500 Iron deficiency anemia secondary to blood loss (chronic): Secondary | ICD-10-CM

## 2022-04-26 NOTE — Telephone Encounter (Signed)
I was able to speak with Theresa Cruz. At this time we will not proceed with any more IV iron due to significant reaction despite having received pre medications. She will resume taking Fusion plus PO daily. We will recheck her labs in mid June. Patient appreciative of call.

## 2022-04-29 ENCOUNTER — Ambulatory Visit

## 2022-05-09 ENCOUNTER — Encounter: Payer: Self-pay | Admitting: *Deleted

## 2022-05-24 ENCOUNTER — Encounter: Payer: Self-pay | Admitting: Allergy

## 2022-05-30 ENCOUNTER — Ambulatory Visit: Admitting: Cardiology

## 2022-05-30 ENCOUNTER — Telehealth: Payer: Self-pay | Admitting: *Deleted

## 2022-05-30 NOTE — Telephone Encounter (Signed)
Called patient and explained rx to [facility] has to get same in clinic for 3 moths then do followup fro approval through her Rx plan at that time. Will reach out once delivery set to make appt to start therapy

## 2022-06-10 ENCOUNTER — Encounter (HOSPITAL_COMMUNITY): Payer: Self-pay | Admitting: Emergency Medicine

## 2022-06-10 ENCOUNTER — Emergency Department (HOSPITAL_COMMUNITY)
Admission: EM | Admit: 2022-06-10 | Discharge: 2022-06-11 | Disposition: A | Discharge status: Home / Self Care | Attending: Emergency Medicine | Admitting: Emergency Medicine

## 2022-06-10 ENCOUNTER — Other Ambulatory Visit: Payer: Self-pay

## 2022-06-10 ENCOUNTER — Emergency Department (HOSPITAL_COMMUNITY)

## 2022-06-10 DIAGNOSIS — R2990 Unspecified symptoms and signs involving the nervous system: Secondary | ICD-10-CM

## 2022-06-10 DIAGNOSIS — R519 Headache, unspecified: Secondary | ICD-10-CM | POA: Diagnosis present

## 2022-06-10 DIAGNOSIS — Z91010 Allergy to peanuts: Secondary | ICD-10-CM | POA: Diagnosis not present

## 2022-06-10 LAB — BASIC METABOLIC PANEL
Anion gap: 8 (ref 5–15)
BUN: 11 mg/dL (ref 6–20)
CO2: 23 mmol/L (ref 22–32)
Calcium: 9.2 mg/dL (ref 8.9–10.3)
Chloride: 106 mmol/L (ref 98–111)
Creatinine, Ser: 0.68 mg/dL (ref 0.44–1.00)
GFR, Estimated: >60 mL/min (ref >60)
Glucose, Bld: 133 mg/dL — ABNORMAL HIGH (ref 70–99)
Potassium: 3.9 mmol/L (ref 3.5–5.1)
Sodium: 137 mmol/L (ref 135–145)

## 2022-06-10 LAB — CBC WITH DIFFERENTIAL/PLATELET
Abs Immature Granulocytes: 0.01 10*3/uL (ref 0.00–0.07)
Basophils Absolute: 0 10*3/uL (ref 0.0–0.1)
Basophils Relative: 1 %
Eosinophils Absolute: 0.1 10*3/uL (ref 0.0–0.5)
Eosinophils Relative: 1 %
HCT: 38.5 % (ref 36.0–46.0)
Hemoglobin: 12.6 g/dL (ref 12.0–15.0)
Immature Granulocytes: 0 %
Lymphocytes Relative: 26 %
Lymphs Abs: 1.5 10*3/uL (ref 0.7–4.0)
MCH: 31.3 pg (ref 26.0–34.0)
MCHC: 32.7 g/dL (ref 30.0–36.0)
MCV: 95.5 fL (ref 80.0–100.0)
Monocytes Absolute: 0.4 10*3/uL (ref 0.1–1.0)
Monocytes Relative: 7 %
Neutro Abs: 3.9 10*3/uL (ref 1.7–7.7)
Neutrophils Relative %: 65 %
Platelets: 157 10*3/uL (ref 150–400)
RBC: 4.03 MIL/uL (ref 3.87–5.11)
RDW: 13.1 % (ref 11.5–15.5)
WBC: 6 10*3/uL (ref 4.0–10.5)
nRBC: 0 % (ref 0.0–0.2)

## 2022-06-10 MED ORDER — MECLIZINE HCL 25 MG PO TABS
25.0000 mg | ORAL_TABLET | Freq: Once | ORAL | Status: AC
  Administered 2022-06-10: 25 mg via ORAL
  Filled 2022-06-10: qty 1

## 2022-06-10 MED ORDER — SODIUM CHLORIDE 0.9 % IV BOLUS
1000.0000 mL | Freq: Once | INTRAVENOUS | Status: AC
  Administered 2022-06-10: 1000 mL via INTRAVENOUS

## 2022-06-10 NOTE — ED Provider Notes (Signed)
[place] EMERGENCY DEPARTMENT AT [facility] Provider Note   CSN: [id number] Arrival date & time: 06/10/22  1953     History  Chief Complaint  Patient presents with   Not Feeling Well     Theresa Cruz is a 37 y.o. female.  HPI   Pt states she feels like time is moving differently, like her body is not catching up.  SHe has noticed some visual auras.  Pt has also noticed some pressure in his head.  Pt does not use drugs but feels like maybe that is the feeling she is having.  Pt feels like when she moves, everything is shifting slowly.  This was happening intermittently and now constantly.  SHe denies any room spinning.  No fevers.  No vomiting.  No trouble moving. This has been ongoing for a few weeks.  Pt has not seen anyone for this.   Home Medications Prior to Admission medications   Medication Sig Start Date End Date Taking? Authorizing Provider  albuterol (VENTOLIN HFA) 108 (90 Base) MCG/ACT inhaler Inhale 2 puffs into the lungs every 4 (four) hours as needed for wheezing or shortness of breath. 10/01/21   Marcelyn Bruins, MD  cromolyn (GASTROCROM) 100 MG/5ML solution Take 5 mLs (100 mg total) by mouth daily before breakfast. 04/06/22   Marcelyn Bruins, MD  diphenhydrAMINE (BENADRYL) 25 mg capsule Take 12.5 mg by mouth at bedtime as needed.    [provider]  EPINEPHrine 0.3 mg/0.3 mL IJ SOAJ injection Inject 0.3 mg into the muscle as needed for anaphylaxis. 10/01/21   Marcelyn Bruins, MD  fluticasone (FLONASE) 50 MCG/ACT nasal spray Place 2 sprays into both nostrils daily as needed for allergies or rhinitis. 11/04/20   Marcelyn Bruins, MD  hydrOXYzine (ATARAX) 10 MG tablet Take 10 mg by mouth at bedtime. As needed 03/03/22   [provider]  Iron-FA-B Cmp-C-Biot-Probiotic (FUSION PLUS) CAPS Take 1 capsule by mouth daily. Patient not taking: Reported on 04/11/2022 10/08/21   Erenest Blank, NP  levocetirizine  (XYZAL) 5 MG tablet Take 1 tablet (5 mg total) by mouth every evening. 04/06/22   Marcelyn Bruins, MD  Magnesium 200 MG TABS Take 200 mg by mouth daily.    [provider]  Olopatadine HCl (PATADAY) 0.2 % SOLN Place 1 drop into both eyes daily as needed. Patient not taking: Reported on 04/11/2022 04/06/22   Marcelyn Bruins, MD  Olopatadine-Mometasone Cristal Generous) [phone] MCG/ACT SUSP 2 sprays each nostril twice a day as needed for runny or stuffy nose. 04/06/22   Marcelyn Bruins, MD  Rimegepant Sulfate (NURTEC) 75 MG TBDP Take 1 tablet by mouth as directed. Every other day 01/12/22   [provider]  sertraline (ZOLOFT) 25 MG tablet Take 1 tablet by mouth daily. 01/07/22   [provider]      Allergies    Feraheme [ferumoxytol], Ferric carboxymaltose, Fish allergy, Fish oil, Imitrex [sumatriptan], Other, Peach flavor, Peanut oil, Peanut-containing drug products, Pollen extract, Rizatriptan, Shellfish-derived products, Benadryl allergy [diphenhydramine hcl], Cocoa, Gluten meal, Milk (cow), Eletriptan, Topamax [topiramate], Eletriptan hydrobromide, and Tape    Review of Systems   Review of Systems  Physical Exam Updated Vital Signs BP (!) 148/98   Pulse 74   Temp 98.3 F (36.8 C)   Resp 16   Ht 1.6 m (5\' 3" )   Wt 52.2 kg   SpO2 100%   BMI 20.37 kg/m  Physical Exam Vitals and nursing note reviewed.  Constitutional:      General: She is not in acute distress.    Appearance: She is well-developed.  HENT:     Head: Normocephalic and atraumatic.     Right Ear: External ear normal.     Left Ear: External ear normal.  Eyes:     General: No visual field deficit or scleral icterus.       Right eye: No discharge.        Left eye: No discharge.     Conjunctiva/sclera: Conjunctivae normal.  Neck:     Trachea: No tracheal deviation.  Cardiovascular:     Rate and Rhythm: Normal rate and regular rhythm.  Pulmonary:     Effort: Pulmonary  effort is normal. No respiratory distress.     Breath sounds: Normal breath sounds. No stridor. No wheezing or rales.  Abdominal:     General: Bowel sounds are normal. There is no distension.     Palpations: Abdomen is soft.     Tenderness: There is no abdominal tenderness. There is no guarding or rebound.  Musculoskeletal:        General: No tenderness.     Cervical back: Neck supple.  Skin:    General: Skin is warm and dry.     Findings: No rash.  Neurological:     Mental Status: She is alert and oriented to person, place, and time.     Cranial Nerves: No cranial nerve deficit, dysarthria or facial asymmetry.     Sensory: No sensory deficit.     Motor: No abnormal muscle tone, seizure activity or pronator drift.     Coordination: Coordination normal.     Comments:  able to hold both legs off bed for 5 seconds, sensation intact in all extremities,  no left or right sided neglect, normal finger-nose exam bilaterally, no nystagmus noted   Psychiatric:        Mood and Affect: Mood normal.     ED Results / Procedures / Treatments   Labs (all labs ordered are listed, but only abnormal results are displayed) Labs Reviewed  BASIC METABOLIC PANEL - Abnormal; Notable for the following components:      Result Value   Glucose, Bld 133 (*)    All other components within normal limits  CBC WITH DIFFERENTIAL/PLATELET    EKG None  Radiology CT Head Wo Contrast  Result Date: 06/10/2022 CLINICAL DATA:  Headache EXAM: CT HEAD WITHOUT CONTRAST TECHNIQUE: Contiguous axial images were obtained from the base of the skull through the vertex without intravenous contrast. RADIATION DOSE REDUCTION: This exam was performed according to the departmental dose-optimization program which includes automated exposure control, adjustment of the mA and/or kV according to patient size and/or use of iterative reconstruction technique. COMPARISON:  06/21/2021 FINDINGS: Brain: No acute infarct or hemorrhage.  Lateral ventricles and midline structures are unremarkable. No acute extra-axial fluid collections. No mass effect. Vascular: No hyperdense vessel or unexpected calcification. Skull: Normal. Negative for fracture or focal lesion. Sinuses/Orbits: No acute finding. Other: None. IMPRESSION: 1. No acute intracranial process. Electronically Signed   By: Sharlet Salina M.D.   On: 06/10/2022 21:08    Procedures Procedures    Medications Ordered in ED Medications  sodium chloride 0.9 % bolus 1,000 mL (has no administration in time range)  meclizine (ANTIVERT) tablet 25 mg (25 mg Oral Given 06/10/22 2249)  sodium chloride 0.9 % bolus 1,000 mL (1,000 mLs Intravenous New Bag/Given 06/10/22 2242)    ED Course/ Medical Decision Making/  A&P Clinical Course as of 06/10/22 2303  Fri Jun 10, 2022  2213 CBC nl metabolic panel nl [JK]  2301 Head CT without acute abnormality [JK]    Clinical Course User Index [JK] Linwood Dibbles, MD                             Medical Decision Making  Patient presented to the ED for evaluation of a sensation of time slowing.  Patient states she has a perception of time pausing or blanking out.  She has not had any syncopal episodes.  No loss of conscious.  Patient has a normal neurologic exam here.  She is not having any signs of fever or infection.  She is not anemic she is not dehydrated.  Patient does have history of migraines and has described auras but states she is not having a typical migraine pattern.  Patient was concerned about the possibility of seizures but presentation is not suggestive of that at this time.  Patient does have a neurologist but was wondering about possibly getting an EEG or neurology evaluation here in the emergency room.  Did discuss the case with Dr. Otelia Limes.  Has low suspicion for seizures.  It is possible this could be complex migraine type pattern or a neuropsychiatric condition.  Patient appears stable for outpatient follow-up with her  neurologist        Final Clinical Impression(s) / ED Diagnoses Final diagnoses:  Neurological complaint    Rx / DC Orders ED Discharge Orders     None         Linwood Dibbles, MD 06/10/22 2303

## 2022-06-10 NOTE — ED Provider Triage Note (Signed)
Emergency Medicine Provider Triage Evaluation Note  Theresa Cruz , a 37 y.o. female  was evaluated in triage.  Pt complains of not feeling well for the past 2 to 3 weeks.  Describes as everything around her moving really slowly, intense pressure on the head that she does not describe as headaches.  She does have history of migraines.  Also reports auras she describes as lines or figures.  Denies any weakness, vision change, nausea.  States she has been told that she has been absent minded during conversation with other people..  Review of Systems  Positive: As above Negative: As above  Physical Exam  BP (!) 148/98   Pulse 74   Temp 98.3 F (36.8 C)   Resp 16   Ht 5\' 3"  (1.6 m)   Wt 52.2 kg   SpO2 100%   BMI 20.37 kg/m  Gen:   Awake, no distress   Resp:  Normal effort  MSK:   Moves extremities without difficulty  Other:    Medical Decision Making  Medically screening exam initiated at 8:23 PM.  Appropriate orders placed.  Theresa Cruz was informed that the remainder of the evaluation will be completed by another provider, this initial triage assessment does not replace that evaluation, and the importance of remaining in the ED until their evaluation is complete.    Marita Kansas, PA-C 06/10/22 2024

## 2022-06-10 NOTE — ED Triage Notes (Signed)
Pt arrives via POV states that she hasn't been feeling well for the past few weeks, increasing in frequency since Wednesday. "I feel like I disappear for a moment and come back to present." "Time is moving slower than time is moving." Endorses seeing auras and intermittent lightheadedness. No LOC. Denies headaches/migraines. No increased anxiety. NO drug/etoh use

## 2022-06-10 NOTE — Discharge Instructions (Addendum)
The test today in the ED were reassuring.   Follow-up with your neurologist for further evaluation of your symptoms

## 2022-06-11 ENCOUNTER — Encounter (HOSPITAL_COMMUNITY): Payer: Self-pay

## 2022-06-11 ENCOUNTER — Observation Stay (HOSPITAL_COMMUNITY)
Admission: EM | Admit: 2022-06-11 | Discharge: 2022-06-12 | Disposition: A | Discharge status: Home / Self Care | Attending: Internal Medicine | Admitting: Internal Medicine

## 2022-06-11 ENCOUNTER — Emergency Department (HOSPITAL_COMMUNITY)

## 2022-06-11 ENCOUNTER — Other Ambulatory Visit: Payer: Self-pay

## 2022-06-11 DIAGNOSIS — Z91010 Allergy to peanuts: Secondary | ICD-10-CM | POA: Insufficient documentation

## 2022-06-11 DIAGNOSIS — T7840XA Allergy, unspecified, initial encounter: Principal | ICD-10-CM | POA: Insufficient documentation

## 2022-06-11 DIAGNOSIS — Z79899 Other long term (current) drug therapy: Secondary | ICD-10-CM | POA: Diagnosis not present

## 2022-06-11 DIAGNOSIS — J45909 Unspecified asthma, uncomplicated: Secondary | ICD-10-CM | POA: Insufficient documentation

## 2022-06-11 DIAGNOSIS — R0602 Shortness of breath: Secondary | ICD-10-CM | POA: Diagnosis present

## 2022-06-11 HISTORY — DX: Mast cell activation, unspecified: D89.40

## 2022-06-11 HISTORY — DX: Postural orthostatic tachycardia syndrome (POTS): G90.A

## 2022-06-11 HISTORY — DX: Ehlers-Danlos syndrome, unspecified: Q79.60

## 2022-06-11 LAB — BASIC METABOLIC PANEL
Anion gap: 6 (ref 5–15)
BUN: 11 mg/dL (ref 6–20)
CO2: 22 mmol/L (ref 22–32)
Calcium: 8.7 mg/dL — ABNORMAL LOW (ref 8.9–10.3)
Chloride: 108 mmol/L (ref 98–111)
Creatinine, Ser: 0.66 mg/dL (ref 0.44–1.00)
GFR, Estimated: >60 mL/min (ref >60)
Glucose, Bld: 91 mg/dL (ref 70–99)
Potassium: 3.8 mmol/L (ref 3.5–5.1)
Sodium: 136 mmol/L (ref 135–145)

## 2022-06-11 LAB — CBC WITH DIFFERENTIAL/PLATELET
Abs Immature Granulocytes: 0.02 10*3/uL (ref 0.00–0.07)
Basophils Absolute: 0 10*3/uL (ref 0.0–0.1)
Basophils Relative: 0 %
Eosinophils Absolute: 0.1 10*3/uL (ref 0.0–0.5)
Eosinophils Relative: 1 %
HCT: 35.4 % — ABNORMAL LOW (ref 36.0–46.0)
Hemoglobin: 11.7 g/dL — ABNORMAL LOW (ref 12.0–15.0)
Immature Granulocytes: 0 %
Lymphocytes Relative: 27 %
Lymphs Abs: 1.3 10*3/uL (ref 0.7–4.0)
MCH: 31.3 pg (ref 26.0–34.0)
MCHC: 33.1 g/dL (ref 30.0–36.0)
MCV: 94.7 fL (ref 80.0–100.0)
Monocytes Absolute: 0.4 10*3/uL (ref 0.1–1.0)
Monocytes Relative: 8 %
Neutro Abs: 3.1 10*3/uL (ref 1.7–7.7)
Neutrophils Relative %: 64 %
Platelets: 130 10*3/uL — ABNORMAL LOW (ref 150–400)
RBC: 3.74 MIL/uL — ABNORMAL LOW (ref 3.87–5.11)
RDW: 13.2 % (ref 11.5–15.5)
WBC: 4.9 10*3/uL (ref 4.0–10.5)
nRBC: 0 % (ref 0.0–0.2)

## 2022-06-11 LAB — I-STAT BETA HCG BLOOD, ED (MC, WL, AP ONLY): I-stat hCG, quantitative: <5 m[IU]/mL (ref <5)

## 2022-06-11 MED ORDER — ALBUTEROL SULFATE (2.5 MG/3ML) 0.083% IN NEBU: 2.5000 mg | INHALATION_SOLUTION | RESPIRATORY_TRACT | Status: DC | PRN

## 2022-06-11 MED ORDER — FAMOTIDINE 20 MG PO TABS
40.0000 mg | ORAL_TABLET | Freq: Once | ORAL | Status: DC
  Filled 2022-06-11: qty 2

## 2022-06-11 MED ORDER — ACETAMINOPHEN 650 MG RE SUPP: 650.0000 mg | Freq: Four times a day (QID) | RECTAL | Status: DC | PRN

## 2022-06-11 MED ORDER — EPINEPHRINE 0.3 MG/0.3ML IJ SOAJ
0.3000 mg | Freq: Once | INTRAMUSCULAR | Status: DC | PRN
  Filled 2022-06-11: qty 0.3

## 2022-06-11 MED ORDER — METHYLPREDNISOLONE SODIUM SUCC 40 MG IJ SOLR
40.0000 mg | Freq: Two times a day (BID) | INTRAMUSCULAR | Status: DC
  Administered 2022-06-11: 40 mg via INTRAVENOUS
  Filled 2022-06-11: qty 1

## 2022-06-11 MED ORDER — EPINEPHRINE 0.3 MG/0.3ML IJ SOAJ: 0.3000 mg | INTRAMUSCULAR | 1 refills | Status: DC | PRN

## 2022-06-11 MED ORDER — EPINEPHRINE 0.15 MG/0.3ML IJ SOAJ: 0.1500 mg | Freq: Once | INTRAMUSCULAR | Status: DC | PRN

## 2022-06-11 MED ORDER — ALBUTEROL SULFATE (2.5 MG/3ML) 0.083% IN NEBU
2.5000 mg | INHALATION_SOLUTION | Freq: Four times a day (QID) | RESPIRATORY_TRACT | Status: DC
  Administered 2022-06-12 (×2): 2.5 mg via RESPIRATORY_TRACT
  Filled 2022-06-11 (×2): qty 3

## 2022-06-11 MED ORDER — HYDRALAZINE HCL 20 MG/ML IJ SOLN: 10.0000 mg | Freq: Four times a day (QID) | INTRAMUSCULAR | Status: DC | PRN

## 2022-06-11 MED ORDER — ALBUTEROL SULFATE HFA 108 (90 BASE) MCG/ACT IN AERS: 2.0000 | INHALATION_SPRAY | RESPIRATORY_TRACT | Status: DC | PRN

## 2022-06-11 MED ORDER — ENOXAPARIN SODIUM 40 MG/0.4ML IJ SOSY
40.0000 mg | PREFILLED_SYRINGE | INTRAMUSCULAR | Status: DC
  Filled 2022-06-11: qty 0.4

## 2022-06-11 MED ORDER — PREDNISOLONE 5 MG PO TABS: 20.0000 mg | ORAL_TABLET | Freq: Two times a day (BID) | ORAL | Status: DC

## 2022-06-11 MED ORDER — SODIUM CHLORIDE 0.9 % IV BOLUS
500.0000 mL | Freq: Once | INTRAVENOUS | Status: AC
  Administered 2022-06-11: 500 mL via INTRAVENOUS

## 2022-06-11 MED ORDER — ALBUTEROL SULFATE HFA 108 (90 BASE) MCG/ACT IN AERS
1.0000 | INHALATION_SPRAY | RESPIRATORY_TRACT | Status: DC | PRN
  Administered 2022-06-11: 2 via RESPIRATORY_TRACT
  Filled 2022-06-11: qty 6.7

## 2022-06-11 MED ORDER — PREDNISONE 20 MG PO TABS: 40.0000 mg | ORAL_TABLET | Freq: Every day | ORAL | 0 refills | Status: DC

## 2022-06-11 MED ORDER — METHYLPREDNISOLONE SODIUM SUCC 125 MG IJ SOLR
125.0000 mg | Freq: Once | INTRAMUSCULAR | Status: AC
  Administered 2022-06-11: 125 mg via INTRAVENOUS
  Filled 2022-06-11: qty 2

## 2022-06-11 MED ORDER — ACETAMINOPHEN 325 MG PO TABS: 650.0000 mg | ORAL_TABLET | Freq: Four times a day (QID) | ORAL | Status: DC | PRN

## 2022-06-11 MED ORDER — SENNA 8.6 MG PO TABS
1.0000 | ORAL_TABLET | Freq: Two times a day (BID) | ORAL | Status: DC
  Administered 2022-06-11: 8.6 mg via ORAL
  Filled 2022-06-11: qty 1

## 2022-06-11 MED ORDER — SODIUM CHLORIDE 0.9 % IV SOLN: INTRAVENOUS | Status: DC

## 2022-06-11 MED ORDER — LIDOCAINE VISCOUS HCL 2 % MT SOLN
15.0000 mL | Freq: Once | OROMUCOSAL | Status: AC
  Administered 2022-06-11: 15 mL via OROMUCOSAL
  Filled 2022-06-11: qty 15

## 2022-06-11 MED ORDER — SERTRALINE HCL 25 MG PO TABS
25.0000 mg | ORAL_TABLET | Freq: Every day | ORAL | Status: DC
  Administered 2022-06-11: 25 mg via ORAL
  Filled 2022-06-11 (×3): qty 1

## 2022-06-11 MED ORDER — EPINEPHRINE 0.3 MG/0.3ML IJ SOAJ
0.3000 mg | Freq: Once | INTRAMUSCULAR | Status: AC
  Administered 2022-06-11: 0.3 mg via INTRAMUSCULAR
  Filled 2022-06-11: qty 0.3

## 2022-06-11 MED ORDER — ONDANSETRON HCL 4 MG PO TABS: 4.0000 mg | ORAL_TABLET | Freq: Four times a day (QID) | ORAL | Status: DC | PRN

## 2022-06-11 MED ORDER — BUPROPION HCL ER (XL) 150 MG PO TB24
150.0000 mg | ORAL_TABLET | Freq: Every day | ORAL | Status: DC
  Administered 2022-06-11: 150 mg via ORAL
  Filled 2022-06-11: qty 1

## 2022-06-11 MED ORDER — KETOROLAC TROMETHAMINE 15 MG/ML IJ SOLN
15.0000 mg | Freq: Once | INTRAMUSCULAR | Status: AC
  Administered 2022-06-11: 15 mg via INTRAVENOUS
  Filled 2022-06-11: qty 1

## 2022-06-11 MED ORDER — PREDNISONE 20 MG PO TABS
20.0000 mg | ORAL_TABLET | Freq: Two times a day (BID) | ORAL | Status: DC
  Administered 2022-06-12: 20 mg via ORAL
  Filled 2022-06-11: qty 1

## 2022-06-11 MED ORDER — CETIRIZINE HCL 5 MG/5ML PO SOLN
10.0000 mg | Freq: Once | ORAL | Status: DC
  Filled 2022-06-11: qty 10

## 2022-06-11 MED ORDER — ONDANSETRON HCL 4 MG/2ML IJ SOLN: 4.0000 mg | Freq: Four times a day (QID) | INTRAMUSCULAR | Status: DC | PRN

## 2022-06-11 NOTE — ED Notes (Signed)
MD at bedside at this time.

## 2022-06-11 NOTE — ED Notes (Signed)
Notified Dahal MD about pt refusing lovenox

## 2022-06-11 NOTE — ED Provider Notes (Signed)
[place] EMERGENCY DEPARTMENT AT [facility] Provider Note   CSN: [id number] Arrival date & time: 06/11/22  1029     History  Chief Complaint  Patient presents with   Allergic Reaction    Theresa Cruz is a 37 y.o. female.   Allergic Reaction   38 year old female presents emergency department with complaints of allergic reaction.  Patient states that she was seen in the emergency department yesterday and prescribed meclizine and feels as if she had adverse side effect to said medication.  States that she woke up around 9:00 this morning with feelings of facial swelling, bilateral eye swelling, chest tightness, tightness in the throat as well as some difficulty swallowing.  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  States that since initial incident, symptoms have significantly improved but still with some "itching of throat" and chest tightness.  Patient did not take any medications for symptoms earlier.  Has EpiPen at home but has never had to administer it.  Denies fever, shortness of breath, abdominal pain, nausea, vomiting, urinary symptoms, change in bowel habits.  Past medical history significant for iron deficiency anemia, generalized anxiety disorder, migraines, fibromyalgia, allergy, asthma  Home Medications Prior to Admission medications   Medication Sig Start Date End Date Taking? Authorizing Provider  EPINEPHrine 0.3 mg/0.3 mL IJ SOAJ injection Inject 0.3 mg into the muscle as needed for anaphylaxis. 06/11/22  Yes Sherian Maroon A, PA  predniSONE (DELTASONE) 20 MG tablet Take 2 tablets (40 mg total) by mouth daily with breakfast for 5 days. 06/12/22 06/17/22 Yes Sherian Maroon A, PA  albuterol (VENTOLIN HFA) 108 (90 Base) MCG/ACT inhaler Inhale 2 puffs into the lungs every 4 (four) hours as needed for wheezing or shortness of breath. 10/01/21   Marcelyn Bruins, MD  cromolyn (GASTROCROM) 100 MG/5ML  solution Take 5 mLs (100 mg total) by mouth daily before breakfast. 04/06/22   Marcelyn Bruins, MD  diphenhydrAMINE (BENADRYL) 25 mg capsule Take 12.5 mg by mouth at bedtime as needed.    [provider]  fluticasone (FLONASE) 50 MCG/ACT nasal spray Place 2 sprays into both nostrils daily as needed for allergies or rhinitis. 11/04/20   Marcelyn Bruins, MD  hydrOXYzine (ATARAX) 10 MG tablet Take 10 mg by mouth at bedtime. As needed 03/03/22   [provider]  Iron-FA-B Cmp-C-Biot-Probiotic (FUSION PLUS) CAPS Take 1 capsule by mouth daily. Patient not taking: Reported on 04/11/2022 10/08/21   Erenest Blank, NP  levocetirizine (XYZAL) 5 MG tablet Take 1 tablet (5 mg total) by mouth every evening. 04/06/22   Marcelyn Bruins, MD  Magnesium 200 MG TABS Take 200 mg by mouth daily.    [provider]  Olopatadine HCl (PATADAY) 0.2 % SOLN Place 1 drop into both eyes daily as needed. Patient not taking: Reported on 04/11/2022 04/06/22   Marcelyn Bruins, MD  Olopatadine-Mometasone Cristal Generous) [phone] MCG/ACT SUSP 2 sprays each nostril twice a day as needed for runny or stuffy nose. 04/06/22   Marcelyn Bruins, MD  Rimegepant Sulfate (NURTEC) 75 MG TBDP Take 1 tablet by mouth as directed. Every other day 01/12/22   [provider]  sertraline (ZOLOFT) 25 MG tablet Take 1 tablet by mouth daily. 01/07/22   [provider]      Allergies    Feraheme [ferumoxytol], Ferric carboxymaltose, Fish allergy, Fish oil, Imitrex [sumatriptan], Other, Peach flavor, Peanut oil, Peanut-containing drug products, Pollen extract, Rizatriptan, Shellfish-derived products,  Benadryl allergy [diphenhydramine hcl], Cocoa, Gluten meal, Milk (cow), Eletriptan, Topamax [topiramate], Eletriptan hydrobromide, and Tape    Review of Systems   Review of Systems  All other systems reviewed and are negative.   Physical Exam Updated Vital Signs BP 120/87  (BP Location: Right Arm)   Pulse 98   Temp 98.2 F (36.8 C)   Resp 20   Ht 5\' 3"  (1.6 m)   Wt 52.2 kg   SpO2 100%   BMI 20.39 kg/m  Physical Exam Vitals and nursing note reviewed.  Constitutional:      General: She is not in acute distress.    Appearance: She is well-developed.  HENT:     Head: Normocephalic and atraumatic.     Mouth/Throat:     Mouth: Mucous membranes are moist.     Pharynx: Oropharynx is clear.     Comments: Patient tolerating oral secretions without difficulty.  No acute distress. Eyes:     Extraocular Movements: Extraocular movements intact.     Conjunctiva/sclera: Conjunctivae normal.     Pupils: Pupils are equal, round, and reactive to light.  Cardiovascular:     Rate and Rhythm: Normal rate and regular rhythm.     Heart sounds: No murmur heard. Pulmonary:     Effort: Pulmonary effort is normal. No respiratory distress.     Breath sounds: Normal breath sounds. No stridor. No wheezing, rhonchi or rales.  Abdominal:     Palpations: Abdomen is soft.     Tenderness: There is no abdominal tenderness.  Musculoskeletal:        General: No swelling.     Cervical back: Neck supple.  Skin:    General: Skin is warm and dry.     Capillary Refill: Capillary refill takes less than 2 seconds.  Neurological:     Mental Status: She is alert.  Psychiatric:        Mood and Affect: Mood normal.     ED Results / Procedures / Treatments   Labs (all labs ordered are listed, but only abnormal results are displayed) Labs Reviewed  BASIC METABOLIC PANEL - Abnormal; Notable for the following components:      Result Value   Calcium 8.7 (*)    All other components within normal limits  CBC WITH DIFFERENTIAL/PLATELET - Abnormal; Notable for the following components:   RBC 3.74 (*)    Hemoglobin 11.7 (*)    HCT 35.4 (*)    Platelets 130 (*)    All other components within normal limits  TRYPTASE  HIV ANTIBODY (ROUTINE TESTING W REFLEX)  BASIC METABOLIC PANEL   CBC  I-STAT BETA HCG BLOOD, ED ([facility], [facility], [facility] ONLY)    EKG EKG Interpretation  Date/Time:  Saturday Jun 11 2022 10:26:45 EDT Ventricular Rate:  72 PR Interval:  132 QRS Duration: 78 QT Interval:  368 QTC Calculation: 402 R Axis:   56 Text Interpretation: Normal sinus rhythm Normal ECG Reconfirmed by Ernie Avena ([id number]) on 06/11/2022 1:18:55 PM  Radiology DG Chest 1 View  Result Date: 06/11/2022 CLINICAL DATA:  Shortness of breath.  Chest pain. EXAM: CHEST  1 VIEW COMPARISON:  03/24/2022 FINDINGS: Midline trachea. Normal heart size and mediastinal contours. No pleural effusion or pneumothorax. Clear lungs. IMPRESSION: No active disease. Electronically Signed   By: Jeronimo Greaves M.D.   On: 06/11/2022 14:07   CT Head Wo Contrast  Result Date: 06/10/2022 CLINICAL DATA:  Headache EXAM: CT HEAD WITHOUT CONTRAST TECHNIQUE: Contiguous axial images were obtained from  the base of the skull through the vertex without intravenous contrast. RADIATION DOSE REDUCTION: This exam was performed according to the departmental dose-optimization program which includes automated exposure control, adjustment of the mA and/or kV according to patient size and/or use of iterative reconstruction technique. COMPARISON:  06/21/2021 FINDINGS: Brain: No acute infarct or hemorrhage. Lateral ventricles and midline structures are unremarkable. No acute extra-axial fluid collections. No mass effect. Vascular: No hyperdense vessel or unexpected calcification. Skull: Normal. Negative for fracture or focal lesion. Sinuses/Orbits: No acute finding. Other: None. IMPRESSION: 1. No acute intracranial process. Electronically Signed   By: Sharlet Salina M.D.   On: 06/10/2022 21:08    Procedures Procedures    Medications Ordered in ED Medications  albuterol (VENTOLIN HFA) 108 (90 Base) MCG/ACT inhaler 1-2 puff (2 puffs Inhalation Given 06/11/22 1448)  acetaminophen (TYLENOL) tablet 650 mg (has no administration in time range)    Or   acetaminophen (TYLENOL) suppository 650 mg (has no administration in time range)  albuterol (PROVENTIL) (2.5 MG/3ML) 0.083% nebulizer solution 2.5 mg (has no administration in time range)  hydrALAZINE (APRESOLINE) injection 10 mg (has no administration in time range)  enoxaparin (LOVENOX) injection 40 mg (has no administration in time range)  senna (SENOKOT) tablet 8.6 mg (has no administration in time range)  ondansetron (ZOFRAN) tablet 4 mg (has no administration in time range)    Or  ondansetron (ZOFRAN) injection 4 mg (has no administration in time range)  methylPREDNISolone sodium succinate (SOLU-MEDROL) 40 mg/mL injection 40 mg (has no administration in time range)  EPINEPHrine (EPIPEN JR) injection 0.15 mg (has no administration in time range)  methylPREDNISolone sodium succinate (SOLU-MEDROL) 125 mg/2 mL injection 125 mg (125 mg Intravenous Given 06/11/22 1221)  sodium chloride 0.9 % bolus 500 mL (0 mLs Intravenous Stopped 06/11/22 1325)  EPINEPHrine (EPI-PEN) injection 0.3 mg (0.3 mg Intramuscular Given 06/11/22 1502)  ketorolac (TORADOL) 15 MG/ML injection 15 mg (15 mg Intravenous Given 06/11/22 1602)  lidocaine (XYLOCAINE) 2 % viscous mouth solution 15 mL (15 mLs Mouth/Throat Given 06/11/22 1602)    ED Course/ Medical Decision Making/ A&P Clinical Course as of 06/11/22 1900  Sat Jun 11, 2022  1321 Reevaluation of the patient showed patient with still feeling of throat itchiness as well as chest tightness.  She states that she is developing some headache with aura which prompted her visit to the emergency department yesterday. [CR]  1455 Patient now reporting worsening of throat swelling.  Will administer EpiPen and observe [CR]  1725 After observation for 2+ hours, patient moves no improvement in symptoms after IV pain.  Still with feelings of throat itchiness and tightness with difficulty swallowing.  Requesting admission and uncomfortability with discharge [CR]  1900 Consulted  hospitalist Dr. Pola Corn regarding the patient who agreed with admission and assume further treatment/care. [CR]    Clinical Course User Index [CR] Peter Garter, PA                             Medical Decision Making Amount and/or Complexity of Data Reviewed Labs: ordered. Radiology: ordered. ECG/medicine tests: ordered.  Risk Prescription drug management. Decision regarding hospitalization.   This patient presents to the ED for concern of allergic reaction, this involves an extensive number of treatment options, and is a complaint that carries with it a high risk of complications and morbidity.  The differential diagnosis includes anaphylaxis, urticaria, seasonal allergies   Co morbidities that complicate the patient  evaluation  See HPI   Additional history obtained:  Additional history obtained from EMR External records from outside source obtained and reviewed including hospital records   Lab Tests:  I Ordered, and personally interpreted labs.  The pertinent results include: Mild hypocalcemia of 8.7 otherwise, electrolytes with normal limits.  No renal dysfunction.  No leukocytosis.  Mild evidence of anemia with hemoglobin 11.7.  Thrombocytopenia with platelet of 130.  Beta-hCG negative   Imaging Studies ordered:  Ordered chest x-ray which was negative for any acute cardiopulmonary abnormalities.  I independently evaluated x-ray imaging is agreement with the radiologist interpretation.   Cardiac Monitoring: / EKG:  The patient was maintained on a cardiac monitor.  I personally viewed and interpreted the cardiac monitored which showed an underlying rhythm of: Normal sinus rhythm   Consultations Obtained:  N/a   Problem List / ED Course / Critical interventions / Medication management  Allergic reaction I ordered medication including epinephrine, sodium chloride, Solu-Medrol, albuterol   Reevaluation of the patient after these medicines showed that the  patient improved I have reviewed the patients home medicines and have made adjustments as needed   Social Determinants of Health:  Denies tobacco, illicit drug use   Test / Admission - Considered:  Allergic reaction Vitals signs within normal range and stable throughout visit. Laboratory/imaging studies significant for: See above 37 year old female presents emergency department with complaints of possible allergic type reaction.  Patient with symptoms of chest tightness, tightness in throat as well as some dysphagia.  Patient's reported symptoms have persisted despite 7+ hours of observation while in the emergency department.  Patient noted no improvement of symptoms after Solu-Medrol or epinephrine or other medicines administered.  Some suspicion patient's symptoms could be secondary to psychogenic source.  More than 30 minutes was spent with patient and family discussing her being appropriately treated for potential allergic reaction with corticosteroids, antihistamine, epinephrine while in the ED with no acute worsening over 7+ hours the patient and family still requesting admission for observation.  Hospital medicine was consulted regarding the patient who agreed with admission and assume further treatment/care.  Patient overall well-appearing, afebrile in no acute distress.  Patient stable upon admission to the hospital.        Final Clinical Impression(s) / ED Diagnoses Final diagnoses:  Allergic reaction, initial encounter    Rx / DC Orders ED Discharge Orders          Ordered    predniSONE (DELTASONE) 20 MG tablet  Daily with breakfast        06/11/22 1436    Ambulatory referral to Neurology  Status:  Canceled       Comments: An appointment is requested in approximately: 1 week   06/11/22 1436    Ambulatory referral to Neurology       Comments: An appointment is requested in approximately: 1 week  [facility] [place]   06/11/22 1437    EPINEPHrine 0.3  mg/0.3 mL IJ SOAJ injection  As needed        06/11/22 1453              Peter Garter, [place] 06/11/22 1900    Wynetta Fines, MD 06/11/22 2212

## 2022-06-11 NOTE — ED Notes (Addendum)
ED TO INPATIENT HANDOFF REPORT  ED Nurse Name and Phone #: [phone]  S Name/Age/Gender Theresa Cruz 37 y.o. female Room/Bed: 048C/048C  Code Status   Code Status: Full Code  Home/SNF/Other Home Patient oriented to: self, place, time, and situation Is this baseline? Yes   Triage Complete: Triage complete  Chief Complaint Allergic reaction [T78.40XA]  Triage Note Pt arrived via [facility] for c/o difficulty breathing, heart palpitations, tight chest, tight throat, dysphagia and swollen face upon waking at 0905. Pt unsure of what she had an allergic reaction to. Pt has 1+ swelling of face. Pt is eupneic and does not appear to be in resp distress at this time. Pt able to maintain oral secretions.    Allergies Allergies  Allergen Reactions   Feraheme [Ferumoxytol] Diarrhea    N/V/D, abdominal pain and chest discomfort.    Ferric Carboxymaltose Anaphylaxis, Shortness Of Breath and Nausea And Vomiting   Fish Allergy Hives and Itching   Fish Oil Itching   Imitrex [Sumatriptan] Other (See Comments)    Muscle tension   Other Hives    Tree nuts   Peach Flavor Hives and Shortness Of Breath   Peanut Oil Hives and Itching   Peanut-Containing Drug Products Hives   Pollen Extract Shortness Of Breath   Rizatriptan Other (See Comments)    Muscles tightening   Shellfish-Derived Products Anaphylaxis   Benadryl Allergy [Diphenhydramine Hcl] Nausea And Vomiting   Cocoa Other (See Comments)    Migraine   Gluten Meal Nausea And Vomiting and Other (See Comments)    Migraine   Milk (Cow) Nausea And Vomiting   Eletriptan Other (See Comments)   Topamax [Topiramate]    Eletriptan Hydrobromide Other (See Comments)   Tape Rash    Level of Care/Admitting Diagnosis ED Disposition     ED Disposition  Admit   Condition  --   Comment  Hospital Area: [facility] [[id number]]  Level of Care: Telemetry Medical [104]  May place patient in observation at [facility] or  [facility] if equivalent level of care is available:: Yes  Covid Evaluation: Asymptomatic - no recent exposure (last 10 days) testing not required  Diagnosis: Allergic reaction [208270]  Admitting Physician: Lorin Glass [[id number]]  Attending Physician: Lorin Glass [[id number]]          B Medical/Surgery History Past Medical History:  Diagnosis Date   Allergic conjunctivitis 05/19/2016   Allergy    Asthma    Fibromyalgia 09/22/2018   GAD (generalized anxiety disorder) 12/08/2021   Hypermobility syndrome 09/30/2021   IDA (iron deficiency anemia) 10/04/2021   Migraines 04/14/2018   Numbness and tingling of lower extremity 04/14/2018   Obsessive compulsive disorder 12/08/2021   Perennial and seasonal allergic rhinitis 05/19/2016   Seafood allergy 03/15/2011   Last Assessment & Plan: Formatting of this note might be different from the original. Counseled on avoidance. She will bring benadryl and Epipen to [place]. She has allergy alert cards written in mandarin to bring. Formatting of this note might be different from the original. Last Assessment & Plan: Formatting of this note might be different from the original. Counseled on avoidance. She will bring    Urticaria    Past Surgical History:  Procedure Laterality Date   NO PAST SURGERIES       A IV Location/Drains/Wounds Patient Lines/Drains/Airways Status     Active Line/Drains/Airways     Name Placement date Placement time Site Days   Peripheral IV 06/10/22 20 G Anterior;Right Forearm  06/10/22  2240  Forearm  1   Peripheral IV 06/11/22 20 G Right Antecubital 06/11/22  1137  Antecubital  less than 1            Intake/Output Last 24 hours No intake or output data in the 24 hours ending 06/11/22 1839  Labs/Imaging Results for orders placed or performed during the hospital encounter of 06/11/22 (from the past 48 hour(s))  Basic metabolic panel     Status: Abnormal   Collection Time: 06/11/22 10:38 AM  Result Value  Ref Range   Sodium 136 135 - 145 mmol/L   Potassium 3.8 3.5 - 5.1 mmol/L   Chloride 108 98 - 111 mmol/L   CO2 22 22 - 32 mmol/L   Glucose, Bld 91 70 - 99 mg/dL    Comment: Glucose reference range applies only to samples taken after fasting for at least 8 hours.   BUN 11 6 - 20 mg/dL   Creatinine, Ser 1.61 0.44 - 1.00 mg/dL   Calcium 8.7 (L) 8.9 - 10.3 mg/dL   GFR, Estimated >09 >60 mL/min    Comment: (NOTE) Calculated using the CKD-EPI Creatinine Equation (2021)    Anion gap 6 5 - 15    Comment: Performed at [facility], [address] [place], [place] [address]  CBC with Differential     Status: Abnormal   Collection Time: 06/11/22 10:38 AM  Result Value Ref Range   WBC 4.9 4.0 - 10.5 K/uL   RBC 3.74 (L) 3.87 - 5.11 MIL/uL   Hemoglobin 11.7 (L) 12.0 - 15.0 g/dL   HCT 81.1 (L) 91.4 - 78.2 %   MCV 94.7 80.0 - 100.0 fL   MCH 31.3 26.0 - 34.0 pg   MCHC 33.1 30.0 - 36.0 g/dL   RDW 95.6 21.3 - 08.6 %   Platelets 130 (L) 150 - 400 K/uL    Comment: REPEATED TO VERIFY   nRBC 0.0 0.0 - 0.2 %   Neutrophils Relative % 64 %   Neutro Abs 3.1 1.7 - 7.7 K/uL   Lymphocytes Relative 27 %   Lymphs Abs 1.3 0.7 - 4.0 K/uL   Monocytes Relative 8 %   Monocytes Absolute 0.4 0.1 - 1.0 K/uL   Eosinophils Relative 1 %   Eosinophils Absolute 0.1 0.0 - 0.5 K/uL   Basophils Relative 0 %   Basophils Absolute 0.0 0.0 - 0.1 K/uL   Immature Granulocytes 0 %   Abs Immature Granulocytes 0.02 0.00 - 0.07 K/uL    Comment: Performed at [facility], [address], [place], [place] [address]  I-Stat beta hCG blood, ED     Status: None   Collection Time: 06/11/22 10:51 AM  Result Value Ref Range   I-stat hCG, quantitative <5.0 <5 mIU/mL   Comment 3            Comment:   GEST. AGE      CONC.  (mIU/mL)   <=1 WEEK        5 - 50     2 WEEKS       50 - 500     3 WEEKS       100 - 10,000     4 WEEKS     1,000 - 30,000        FEMALE AND NON-PREGNANT FEMALE:     LESS THAN 5 mIU/mL    DG Chest  1 View  Result Date: 06/11/2022 CLINICAL DATA:  Shortness of  breath.  Chest pain. EXAM: CHEST  1 VIEW COMPARISON:  03/24/2022 FINDINGS: Midline trachea. Normal heart size and mediastinal contours. No pleural effusion or pneumothorax. Clear lungs. IMPRESSION: No active disease. Electronically Signed   By: Jeronimo [place] M.D.   On: 06/11/2022 14:07   CT Head Wo Contrast  Result Date: 06/10/2022 CLINICAL DATA:  Headache EXAM: CT HEAD WITHOUT CONTRAST TECHNIQUE: Contiguous axial images were obtained from the base of the skull through the vertex without intravenous contrast. RADIATION DOSE REDUCTION: This exam was performed according to the departmental dose-optimization program which includes automated exposure control, adjustment of the mA and/or kV according to patient size and/or use of iterative reconstruction technique. COMPARISON:  06/21/2021 FINDINGS: Brain: No acute infarct or hemorrhage. Lateral ventricles and midline structures are unremarkable. No acute extra-axial fluid collections. No mass effect. Vascular: No hyperdense vessel or unexpected calcification. Skull: Normal. Negative for fracture or focal lesion. Sinuses/Orbits: No acute finding. Other: None. IMPRESSION: 1. No acute intracranial process. Electronically Signed   By: Sharlet Salina M.D.   On: 06/10/2022 21:08    Pending Labs Unresulted Labs (From admission, onward)     Start     Ordered   06/12/22 0500  Basic metabolic panel  Tomorrow morning,   R        06/11/22 1820   06/12/22 0500  CBC  Tomorrow morning,   R        06/11/22 1820   06/11/22 1821  HIV Antibody (routine testing w rflx)  (HIV Antibody (Routine testing w reflex) panel)  Once,   R        06/11/22 1820   06/11/22 1223  Tryptase  Once,   URGENT        06/11/22 1223            Vitals/Pain Today's Vitals   06/11/22 1032 06/11/22 1034 06/11/22 1035 06/11/22 1451  BP:  (!) 136/92  115/83  Pulse:  84  94  Resp:  18  17  Temp:  98.8 F (37.1 C)  98.2 F (36.8  C)  SpO2: 99% 100%  100%  Weight:   52.2 kg   Height:   5\' 3"  (1.6 m)   PainSc:  4       Isolation Precautions No active isolations  Medications Medications  albuterol (VENTOLIN HFA) 108 (90 Base) MCG/ACT inhaler 1-2 puff (2 puffs Inhalation Given 06/11/22 1448)  acetaminophen (TYLENOL) tablet 650 mg (has no administration in time range)    Or  acetaminophen (TYLENOL) suppository 650 mg (has no administration in time range)  albuterol (PROVENTIL) (2.5 MG/3ML) 0.083% nebulizer solution 2.5 mg (has no administration in time range)  hydrALAZINE (APRESOLINE) injection 10 mg (has no administration in time range)  enoxaparin (LOVENOX) injection 40 mg (has no administration in time range)  senna (SENOKOT) tablet 8.6 mg (has no administration in time range)  ondansetron (ZOFRAN) tablet 4 mg (has no administration in time range)    Or  ondansetron (ZOFRAN) injection 4 mg (has no administration in time range)  methylPREDNISolone sodium succinate (SOLU-MEDROL) 40 mg/mL injection 40 mg (has no administration in time range)  EPINEPHrine (EPIPEN JR) injection 0.15 mg (has no administration in time range)  methylPREDNISolone sodium succinate (SOLU-MEDROL) 125 mg/2 mL injection 125 mg (125 mg Intravenous Given 06/11/22 1221)  sodium chloride 0.9 % bolus 500 mL (0 mLs Intravenous Stopped 06/11/22 1325)  EPINEPHrine (EPI-PEN) injection 0.3 mg (0.3 mg Intramuscular Given 06/11/22 1502)  ketorolac (TORADOL) 15 MG/ML injection 15  mg (15 mg Intravenous Given 06/11/22 1602)  lidocaine (XYLOCAINE) 2 % viscous mouth solution 15 mL (15 mLs Mouth/Throat Given 06/11/22 1602)    Mobility walks     Focused Assessments Cardiac Assessment Handoff:    No results found for: "CKTOTAL", "CKMB", "CKMBINDEX", "TROPONINI" Lab Results  Component Value Date   DDIMER 0.53 (H) 03/24/2022   Does the Patient currently have chest pain? No   , Pulmonary Assessment Handoff:  Lung sounds: Bilateral Breath Sounds:  Clear L Breath Sounds: Clear R Breath Sounds: Clear O2 Device: Room Air      R Recommendations: See Admitting Provider Note  Report given to:   Additional Notes: Pt A&Ox4, ambulatory, hx of vertigo, migraines, POTS, Mast cell activation, Ehler's Danlos. Pt reports she was seen last night in the ED, pt suspects delayed reaction to medications administered last night. Pt in NAD at this time.

## 2022-06-11 NOTE — H&P (Signed)
[facility] History and Physical  Theresa Cruz ZOX:[id number] DOB: 1985-03-09 DOA: 06/11/2022 PCP: Cyril Mourning, FNP  Admitted from: Home Chief Complaint: Shortness of breath  History of Present Illness: Theresa Cruz is a 37 y.o. 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  On chart review, I noted that patient was following up with allergist Thermon Leyland NP.  In 2021, she had comprehensive allergens test done and was found to have positive allergy to several foods including nuts. It seems she was recently in process of initiation of [facility]. 5/17, patient presented to the ED with complaint of not feeling well for last few weeks, auras, intermittent lightheadedness without headache or LOC.  She is to feel the symptoms intermittently only but lately feeling constantly.  No history of drug use. Neurological exam was unremarkable.  CBC, BMP and CT head was unremarkable.  She did not have any symptoms or signs of infection.  Patient had no history of seizure but she was concerned with the possibility of that.  EDP discussed the case with neurologist Dr. Otelia Limes who suspected complex migraine.  Inpatient management was not recommended and patient was discharged home to follow-up with neurologist as an outpatient.  While in the ED, she was given a dose of meclizine. 5/18, patient was brought to the ED by EMS with complaint of facial swelling, bilateral eyelid swelling difficulty breathing, palpitation, chest tightness, throat tightness, dysphagia and swollen face which he noticed when she woke up at 9 AM this morning.  Patient has a long list of allergies but she does not think she was exposed to any of them lately.  She suspects she could be having an allergy to meclizine she got in the ED yesterday.  She has EpiPen at home but never had to administer it.  While at ED Triage, patient  was noted to have facial swelling.  She was not in respiratory distress. She reported significant improvement in her initial symptoms but he still complain of itching of throat and chest tightness. In the ED, patient was afebrile, heart rate 84, blood pressure 136/92 currently on room air Labs showed unremarkable CBC and BMP Chest x-ray unremarkable. Patient was given 1 dose of EpiPen and observed.  She also received 1 dose of IV Solu-Medrol 125 mg, albuterol, Toradol, viscous lidocaine.  After observation for about 3+ hours, patient still felt symptomatic with throat itching and chest tightness.  Patient and family were not comfortable with discharge.  At the time of my evaluation, patient was propped up in bed.  She stated she still had chest tightness, difficulty swallowing.  She showed me the pictures from this morning.  Evidently had facial swelling this morning.  Swelling improved now.  Patient however does not feel better.  Sister at bedside.  Mom on the phone.  Review of Systems:  All systems were reviewed and were negative unless otherwise mentioned in the HPI   Past medical history: Past Medical History:  Diagnosis Date   Allergic conjunctivitis 05/19/2016   Allergy    Asthma    Ehlers-Danlos disease    Fibromyalgia 09/22/2018   GAD (generalized anxiety disorder) 12/08/2021   Hypermobility syndrome 09/30/2021   IDA (iron deficiency anemia) 10/04/2021   Mast cell activation (HCC)    Migraines 04/14/2018   Numbness and tingling of lower extremity 04/14/2018   Obsessive compulsive disorder 12/08/2021   Perennial and seasonal allergic rhinitis 05/19/2016   POTS (postural  orthostatic tachycardia syndrome)    Seafood allergy 03/15/2011   Last Assessment & Plan: Formatting of this note might be different from the original. Counseled on avoidance. She will bring benadryl and Epipen to [place]. She has allergy alert cards written in mandarin to bring. Formatting of this note might be  different from the original. Last Assessment & Plan: Formatting of this note might be different from the original. Counseled on avoidance. She will bring    Urticaria     Past surgical history: Past Surgical History:  Procedure Laterality Date   NO PAST SURGERIES      Social History:  reports that she has never smoked. She has never used smokeless tobacco. She reports that she does not currently use alcohol. She reports that she does not use drugs.  Allergies:  Allergies  Allergen Reactions   Feraheme [Ferumoxytol] Diarrhea    N/V/D, abdominal pain and chest discomfort.    Ferric Carboxymaltose Anaphylaxis, Shortness Of Breath and Nausea And Vomiting   Fish Allergy Hives and Itching   Fish Oil Itching   Imitrex [Sumatriptan] Other (See Comments)    Muscle tension   Other Hives    Tree nuts   Peach Flavor Hives and Shortness Of Breath   Peanut Oil Hives and Itching   Peanut-Containing Drug Products Hives   Pollen Extract Shortness Of Breath   Rizatriptan Other (See Comments)    Muscles tightening   Shellfish-Derived Products Anaphylaxis   Benadryl Allergy [Diphenhydramine Hcl] Nausea And Vomiting   Cocoa Other (See Comments)    Migraine   Gluten Meal Nausea And Vomiting and Other (See Comments)    Migraine   Milk (Cow) Nausea And Vomiting   Eletriptan Other (See Comments)   Topamax [Topiramate]    Eletriptan Hydrobromide Other (See Comments)   Tape Rash   Feraheme [ferumoxytol], Ferric carboxymaltose, Fish allergy, Fish oil, Imitrex [sumatriptan], Other, Peach flavor, Peanut oil, Peanut-containing drug products, Pollen extract, Rizatriptan, Shellfish-derived products, Benadryl allergy [diphenhydramine hcl], Cocoa, Gluten meal, Milk (cow), Eletriptan, Topamax [topiramate], Eletriptan hydrobromide, and Tape   Family history:  Family History  Problem Relation Age of Onset   Fibroids Mother    Brain cancer Father    Hypertension Maternal Grandmother    Stroke Maternal  Grandmother    Thyroid disease Maternal Grandmother    Cancer Maternal Grandfather    Alzheimer's disease Maternal Grandfather    Prostate cancer Maternal Grandfather    Alzheimer's disease Paternal Grandmother    Allergic rhinitis Neg Hx    Angioedema Neg Hx    Asthma Neg Hx    Eczema Neg Hx    Immunodeficiency Neg Hx    Urticaria Neg Hx      Home Meds: Prior to Admission medications   Medication Sig Start Date End Date Taking? Authorizing Provider  EPINEPHrine 0.3 mg/0.3 mL IJ SOAJ injection Inject 0.3 mg into the muscle as needed for anaphylaxis. 06/11/22  Yes Sherian Maroon A, PA  predniSONE (DELTASONE) 20 MG tablet Take 2 tablets (40 mg total) by mouth daily with breakfast for 5 days. 06/12/22 06/17/22 Yes Sherian Maroon A, PA  albuterol (VENTOLIN HFA) 108 (90 Base) MCG/ACT inhaler Inhale 2 puffs into the lungs every 4 (four) hours as needed for wheezing or shortness of breath. 10/01/21   Marcelyn Bruins, MD  cromolyn (GASTROCROM) 100 MG/5ML solution Take 5 mLs (100 mg total) by mouth daily before breakfast. 04/06/22   Marcelyn Bruins, MD  diphenhydrAMINE (BENADRYL) 25 mg capsule Take 12.5  mg by mouth at bedtime as needed.    [provider]  fluticasone (FLONASE) 50 MCG/ACT nasal spray Place 2 sprays into both nostrils daily as needed for allergies or rhinitis. 11/04/20   Marcelyn Bruins, MD  hydrOXYzine (ATARAX) 10 MG tablet Take 10 mg by mouth at bedtime. As needed 03/03/22   [provider]  Iron-FA-B Cmp-C-Biot-Probiotic (FUSION PLUS) CAPS Take 1 capsule by mouth daily. Patient not taking: Reported on 04/11/2022 10/08/21   Erenest Blank, NP  levocetirizine (XYZAL) 5 MG tablet Take 1 tablet (5 mg total) by mouth every evening. 04/06/22   Marcelyn Bruins, MD  Magnesium 200 MG TABS Take 200 mg by mouth daily.    [provider]  Olopatadine HCl (PATADAY) 0.2 % SOLN Place 1 drop into both eyes daily as needed. Patient not  taking: Reported on 04/11/2022 04/06/22   Marcelyn Bruins, MD  Olopatadine-Mometasone Cristal Generous) [id number] MCG/ACT SUSP 2 sprays each nostril twice a day as needed for runny or stuffy nose. 04/06/22   Marcelyn Bruins, MD  Rimegepant Sulfate (NURTEC) 75 MG TBDP Take 1 tablet by mouth as directed. Every other day 01/12/22   [provider]  sertraline (ZOLOFT) 25 MG tablet Take 1 tablet by mouth daily. 01/07/22   [provider]    Physical Exam: Vitals:   06/11/22 1035 06/11/22 1451 06/11/22 1849 06/11/22 1911  BP:  115/83 120/87   Pulse:  94 98   Resp:  17 20   Temp:  98.2 F (36.8 C)  98.9 F (37.2 C)  TempSrc:    Oral  SpO2:  100% 100%   Weight: 52.2 kg     Height: 5\' 3"  (1.6 m)      Wt Readings from Last 3 Encounters:  06/11/22 52.2 kg  06/10/22 52.2 kg  04/22/22 52.2 kg   Body mass index is 20.39 kg/m.  General exam: Young African-American female.  Propped up in bed Skin: No rashes, lesions or ulcers. HEENT: Atraumatic, normocephalic, no obvious bleeding Lungs: Clear to auscultation bilaterally CVS: Regular rate and rhythm, no murmur GI/Abd soft, nontender, nondistended, bowels are present CNS: Alert, awake, oriented x 3 Psychiatry: Sad affect Extremities: No pedal edema, no calf tenderness   ------------------------------------------------------------------------------------------------------ Assessment/Plan: Principal Problem:   Allergic reaction  Allergic reaction Presented with facial swelling, eyelid swelling, chest tightness, throat tightness, dysphagia Unclear etiology.  Has h/o allergy to several agents Patient suspects this reaction could be secondary to meclizine that she received in the ED yesterday. In the ED, she was given 1 dose of EpiPen, IV Solu-Medrol 125 mg, albuterol, Toradol, viscous lidocaine.  Clinically, swelling seems to be improving.  She is not in respiratory distress.  However she is concerned about the  risk of recurrence of symptoms.  Patient and family not comfortable discharge.  Will keep her in observation overnight. I would keep her on scheduled Solu-Medrol IV 40 mg twice daily for now.  EpiPen to be used if needed. Patient follows up with allergist Thermon Leyland NP.  In 2021, she had comprehensive allergens test done and was found to have positive allergy to several foods including nuts. It seems she was recently in process of initiation of [facility] PTA on oral Benadryl PRN, Atarax as needed,  Chronic iron deficiency anemia Supposed to be on regular iron supplement at home but she states her ferritin level does not build up with that and she has been getting iron infusion periodically. Recent Labs  10/04/21 1037 10/04/21 1038 12/13/21 1010 12/13/21 1012 03/24/22 0804 04/11/22 0951 04/11/22 0952 06/10/22 2030 06/11/22 1038  HGB 12.3  --  12.3  --  13.0 12.1  --  12.6 11.7*  MCV 94.4  --  95.0  --  94.4 93.3  --  95.5 94.7  FERRITIN 15  --  21  --   --  10*  --   --   --   TIBC 375  --  343  --   --  337  --   --   --   IRON 156  --  92  --   --  64  --   --   --   RETICCTPCT  --  1.0  --  1.2  --   --  0.8  --   --    Asthma PTA on levocetirizine daily, bronchodilators PRN, Flonase as needed  Fibromyalgia generalized anxiety disorder OCD PTA on Zoloft 25 mg daily, Wellbutrin 150 mg daily.  migraine PTA on Nurtec PRN  Mobility: Encourage ambulation  Goals of care   Code Status: Full Code    DVT prophylaxis: Patient was to avoid Lovenox as she has never taken it before and is afraid it could trigger another allergy.  Encourage ambulation   Antimicrobials: None Fluid: NS at 50 mill per hour overnight Consultants: None Family Communication: Sister at bedside  Dispo: The patient is from: Home              Anticipated d/c is to: Home hopefully tomorrow  Diet: Diet Order             Diet regular Room service appropriate? Yes; Fluid consistency: Thin  Diet  effective now                   ------------------------------------------------------------------------------------- Severity of Illness: The appropriate patient status for this patient is OBSERVATION. Observation status is judged to be reasonable and necessary in order to provide the required intensity of service to ensure the patient's safety. The patient's presenting symptoms, physical exam findings, and initial radiographic and laboratory data in the context of their medical condition is felt to place them at decreased risk for further clinical deterioration. Furthermore, it is anticipated that the patient will be medically stable for discharge from the hospital within 2 midnights of admission.  -------------------------------------------------------------------------------------   Labs on Admission:   CBC: Recent Labs  Lab 06/10/22 2030 06/11/22 1038  WBC 6.0 4.9  NEUTROABS 3.9 3.1  HGB 12.6 11.7*  HCT 38.5 35.4*  MCV 95.5 94.7  PLT 157 130*    Basic Metabolic Panel: Recent Labs  Lab 06/10/22 2030 06/11/22 1038  NA 137 136  K 3.9 3.8  CL 106 108  CO2 23 22  GLUCOSE 133* 91  BUN 11 11  CREATININE 0.68 0.66  CALCIUM 9.2 8.7*    Liver Function Tests: No results for input(s): "AST", "ALT", "ALKPHOS", "BILITOT", "PROT", "ALBUMIN" in the last 168 hours. No results for input(s): "LIPASE", "AMYLASE" in the last 168 hours. No results for input(s): "AMMONIA" in the last 168 hours.  Cardiac Enzymes: No results for input(s): "CKTOTAL", "CKMB", "CKMBINDEX", "TROPONINI" in the last 168 hours.  BNP (last 3 results) Recent Labs    03/24/22 0804  BNP 9.9    ProBNP (last 3 results) No results for input(s): "PROBNP" in the last 8760 hours.  CBG: No results for input(s): "GLUCAP" in the last 168 hours.  Lipase     Component  Value Date/Time   LIPASE 47 03/24/2022 0941     Urinalysis    Component Value Date/Time   BILIRUBINUR negative 03/26/2022 1308    BILIRUBINUR neg 12/02/2013 1040   KETONESUR negative 03/26/2022 1308   PROTEINUR negative 03/26/2022 1308   PROTEINUR neg 12/02/2013 1040   UROBILINOGEN 0.2 03/26/2022 1308   NITRITE Negative 03/26/2022 1308   NITRITE neg 12/02/2013 1040   LEUKOCYTESUR Negative 03/26/2022 1308     Drugs of Abuse  No results found for: "LABOPIA", "COCAINSCRNUR", "LABBENZ", "AMPHETMU", "THCU", "LABBARB"    Radiological Exams on Admission: DG Chest 1 View  Result Date: 06/11/2022 CLINICAL DATA:  Shortness of breath.  Chest pain. EXAM: CHEST  1 VIEW COMPARISON:  03/24/2022 FINDINGS: Midline trachea. Normal heart size and mediastinal contours. No pleural effusion or pneumothorax. Clear lungs. IMPRESSION: No active disease. Electronically Signed   By: Jeronimo Greaves M.D.   On: 06/11/2022 14:07   CT Head Wo Contrast  Result Date: 06/10/2022 CLINICAL DATA:  Headache EXAM: CT HEAD WITHOUT CONTRAST TECHNIQUE: Contiguous axial images were obtained from the base of the skull through the vertex without intravenous contrast. RADIATION DOSE REDUCTION: This exam was performed according to the departmental dose-optimization program which includes automated exposure control, adjustment of the mA and/or kV according to patient size and/or use of iterative reconstruction technique. COMPARISON:  06/21/2021 FINDINGS: Brain: No acute infarct or hemorrhage. Lateral ventricles and midline structures are unremarkable. No acute extra-axial fluid collections. No mass effect. Vascular: No hyperdense vessel or unexpected calcification. Skull: Normal. Negative for fracture or focal lesion. Sinuses/Orbits: No acute finding. Other: None. IMPRESSION: 1. No acute intracranial process. Electronically Signed   By: Sharlet Salina M.D.   On: 06/10/2022 21:08     Signed, Lorin Glass, MD [facility] 06/11/2022

## 2022-06-11 NOTE — ED Triage Notes (Signed)
Pt arrived via [facility] for c/o difficulty breathing, heart palpitations, tight chest, tight throat, dysphagia and swollen face upon waking at 0905. Pt unsure of what she had an allergic reaction to. Pt has 1+ swelling of face. Pt is eupneic and does not appear to be in resp distress at this time. Pt able to maintain oral secretions.

## 2022-06-12 ENCOUNTER — Encounter: Payer: Self-pay | Admitting: Allergy

## 2022-06-12 DIAGNOSIS — T7840XA Allergy, unspecified, initial encounter: Secondary | ICD-10-CM | POA: Diagnosis not present

## 2022-06-12 LAB — CBC
HCT: 33.5 % — ABNORMAL LOW (ref 36.0–46.0)
Hemoglobin: 11.4 g/dL — ABNORMAL LOW (ref 12.0–15.0)
MCH: 31.9 pg (ref 26.0–34.0)
MCHC: 34 g/dL (ref 30.0–36.0)
MCV: 93.8 fL (ref 80.0–100.0)
Platelets: 145 10*3/uL — ABNORMAL LOW (ref 150–400)
RBC: 3.57 MIL/uL — ABNORMAL LOW (ref 3.87–5.11)
RDW: 13.1 % (ref 11.5–15.5)
WBC: 11.3 10*3/uL — ABNORMAL HIGH (ref 4.0–10.5)
nRBC: 0 % (ref 0.0–0.2)

## 2022-06-12 LAB — BASIC METABOLIC PANEL
Anion gap: 7 (ref 5–15)
BUN: 14 mg/dL (ref 6–20)
CO2: 21 mmol/L — ABNORMAL LOW (ref 22–32)
Calcium: 9 mg/dL (ref 8.9–10.3)
Chloride: 110 mmol/L (ref 98–111)
Creatinine, Ser: 0.7 mg/dL (ref 0.44–1.00)
GFR, Estimated: >60 mL/min (ref >60)
Glucose, Bld: 124 mg/dL — ABNORMAL HIGH (ref 70–99)
Potassium: 3.9 mmol/L (ref 3.5–5.1)
Sodium: 138 mmol/L (ref 135–145)

## 2022-06-12 LAB — HIV ANTIBODY (ROUTINE TESTING W REFLEX): HIV Screen 4th Generation wRfx: NONREACTIVE

## 2022-06-12 MED ORDER — SERTRALINE HCL 25 MG PO TABS: 25.0000 mg | ORAL_TABLET | Freq: Every day | ORAL | 0 refills | Status: AC

## 2022-06-12 MED ORDER — PREDNISONE 10 MG PO TABS: 10.0000 mg | ORAL_TABLET | Freq: Two times a day (BID) | ORAL | 0 refills | Status: AC

## 2022-06-12 NOTE — Plan of Care (Signed)

## 2022-06-12 NOTE — Discharge Summary (Signed)
Physician Discharge Summary  Theresa Cruz NID:[id number] DOB: 12/27/1985 DOA: 06/11/2022  PCP: Cyril Mourning, FNP  Admit date: 06/11/2022 Discharge date: 06/12/2022  Admitted From: Home Discharge disposition: Home  Recommendations at discharge:  Continue prednisone for 5 days Ensure compliance to your psychiatric meds Follow-up with allergist as an outpatient.   Brief narrative: Theresa Cruz is a 37 y.o. 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  Patient follows up with allergist Thermon Leyland NP.  In 2021, she had comprehensive allergens test done and was found to have positive allergy to several foods including nuts. It seems she was recently in process of initiation of a medicine by the name [facility]. 5/17, patient presented to the ED with complaint of not feeling well for last few weeks, auras, intermittent lightheadedness without headache or LOC.  She is to feel the symptoms intermittently only but lately feeling constantly.  No history of drug use. Neurological exam was unremarkable.  CBC, BMP and CT head was unremarkable.  She did not have any symptoms or signs of infection.  Patient had no history of seizure but she was concerned with the possibility of that.  EDP discussed the case with neurologist Dr. Otelia Limes who suspected complex migraine.  Inpatient management was not recommended and patient was discharged home to follow-up with neurologist as an outpatient.  While in the ED, she was given a dose of meclizine. 5/18, patient was brought to the ED by EMS with complaint of facial swelling, bilateral eyelid swelling difficulty breathing, palpitation, chest tightness, throat tightness, dysphagia and swollen face which he noticed when she woke up at 9 AM this morning.  Patient has a long list of allergies but she does not think she was exposed to any of them lately.  She  suspects she could be having an allergy to meclizine she got in the ED the previous day on 5/17.  She has EpiPen at home but never had to administer it.  While at ED Triage, patient was noted to have facial swelling.  She was not in respiratory distress. She reported significant improvement in her initial symptoms but he still complain of itching of throat and chest tightness. In the ED, patient was afebrile, heart rate 84, blood pressure 136/92 currently on room air Labs showed unremarkable CBC and BMP Chest x-ray unremarkable. Patient was given 1 dose of EpiPen and observed.  She also received 1 dose of IV Solu-Medrol 125 mg, albuterol, Toradol, viscous lidocaine.  After observation for about 3+ hours, patient still felt symptomatic with throat itching and chest tightness.  Patient and family were not comfortable with discharge.  At the request of patient, she was kept on overnight Observation under Hospitalist Service. 5/18, at the time of my evaluation, patient was propped up in bed.  She was not in any respite distress. But she still complained of chest tightness, difficulty swallowing.  She showed me the pictures from this morning.  Evidently had facial swelling that morning.  Swelling had improved by the time of my evaluation.   Subjective: Patient was seen and examined this morning.  Mother at bedside. Patient slept well.  She states she woke up this morning with redness in her face.  Feels itching in her throat.  She believes she is going through an allergic reaction and was wondering if she could use the EpiPen.  At the time of my evaluation, I did not see any evidence  of allergic reaction.   Assessment/Plan: Principal Problem:   Allergic reaction  Allergic reaction H/o allergy to multiple agents Presented with facial swelling, eyelid swelling, chest tightness, throat tightness, dysphagia Unclear etiology of allergy response this time. Patient suspected this reaction could be  secondary to meclizine that she received in the ED the previous day on 5/17. In the ED, she was given 1 dose of EpiPen, IV Solu-Medrol 125 mg, albuterol, Toradol, viscous lidocaine.  Clinically, swelling seems to be improving.  She is not in respiratory distress.   Kept on overnight observation at the request of patient and family. She was also started on oral prednisone which she has been able to tolerate. She does not have any objective evidence of allergy response in our monitoring. Discharge on 5 days of oral prednisone 10 mg twice daily.  EpiPen to be used if needed. Patient follows up with allergist Thermon Leyland NP.  In 2021, she had comprehensive allergens test done and was found to have positive allergy to several foods including nuts. It seems she was recently in process of initiation of [facility] Follow-up with allergist as an outpatient.  Chronic iron deficiency anemia Supposed to be on regular iron supplement at home but she states her ferritin level does not build up with that and she has been getting iron infusion periodically. Recent Labs    10/04/21 1037 10/04/21 1038 12/13/21 1010 12/13/21 1012 03/24/22 0804 04/11/22 0951 04/11/22 0952 06/10/22 2030 06/11/22 1038 06/12/22 0630  HGB 12.3  --  12.3  --  13.0 12.1  --  12.6 11.7* 11.4*  MCV 94.4  --  95.0  --  94.4 93.3  --  95.5 94.7 93.8  FERRITIN 15  --  21  --   --  10*  --   --   --   --   TIBC 375  --  343  --   --  337  --   --   --   --   IRON 156  --  92  --   --  64  --   --   --   --   RETICCTPCT  --  1.0  --  1.2  --   --  0.8  --   --   --    Asthma PTA on levocetirizine daily, bronchodilators PRN, Flonase as needed  Fibromyalgia generalized anxiety disorder OCD PTA on Zoloft 25 mg daily, Wellbutrin 150 mg daily.  However she was not taking it regularly as she supposed to.  Recommended to ensure compliance.  Goals of care   Code Status: Full Code   Wounds:  -    Discharge Exam:   Vitals:    06/12/22 0207 06/12/22 0304 06/12/22 0731 06/12/22 0911  BP:  117/75 114/80   Pulse:  82 71   Resp:  18 16   Temp:  98.4 F (36.9 C) 98.5 F (36.9 C)   TempSrc:  Oral Oral   SpO2: 98% 100% 100% 97%  Weight:      Height:        Body mass index is 20.39 kg/m.  General exam: Young African-American female.  Propped up in bed Skin: No rashes, lesions or ulcers. HEENT: Atraumatic, normocephalic, no obvious bleeding.  Mild redness noted in face but does not look any different than yesterday. Lungs: Clear to auscultation bilaterally CVS: Regular rate and rhythm, no murmur GI/Abd soft, nontender, nondistended, bowels are present CNS: Alert, awake, oriented x 3 Psychiatry: Sad  affect.  Extremities: No pedal edema, no calf tenderness  Follow ups:    Follow-up Information     Cyril Mourning, FNP Follow up.   Specialty: Nurse Practitioner Contact information: [address] [place] [address] [phone]                 Discharge Instructions:   Discharge Instructions     Ambulatory referral to Neurology   Complete by: As directed    An appointment is requested in approximately: 1 week  [facility] [place]   Call MD for:  difficulty breathing, headache or visual disturbances   Complete by: As directed    Call MD for:  extreme fatigue   Complete by: As directed    Call MD for:  hives   Complete by: As directed    Call MD for:  persistant dizziness or light-headedness   Complete by: As directed    Call MD for:  persistant nausea and vomiting   Complete by: As directed    Call MD for:  severe uncontrolled pain   Complete by: As directed    Call MD for:  temperature >100.4   Complete by: As directed    Diet general   Complete by: As directed    Discharge instructions   Complete by: As directed    Recommendations at discharge:   Continue prednisone for 5 days  Ensure compliance to your psychiatric meds  Follow-up  with allergist as an outpatient.  General discharge instructions: Follow with Primary MD Cyril Mourning, FNP in 7 days  Please request your PCP  to go over your hospital tests, procedures, radiology results at the follow up. Please get your medicines reviewed and adjusted.  Your PCP may decide to repeat certain labs or tests as needed. Do not drive, operate heavy machinery, perform activities at heights, swimming or participation in water activities or provide baby sitting services if your were admitted for syncope or siezures until you have seen by Primary MD or a Neurologist and advised to do so again. [place] Controlled Substance Reporting System database was reviewed. Do not drive, operate heavy machinery, perform activities at heights, swim, participate in water activities or provide baby-sitting services while on medications for pain, sleep and mood until your outpatient physician has reevaluated you and advised to do so again.  You are strongly recommended to comply with the dose, frequency and duration of prescribed medications. Activity: As tolerated with Full fall precautions use walker/cane & assistance as needed Avoid using any recreational substances like cigarette, tobacco, alcohol, or non-prescribed drug. If you experience worsening of your admission symptoms, develop shortness of breath, life threatening emergency, suicidal or homicidal thoughts you must seek medical attention immediately by calling 911 or calling your MD immediately  if symptoms less severe. You must read complete instructions/literature along with all the possible adverse reactions/side effects for all the medicines you take and that have been prescribed to you. Take any new medicine only after you have completely understood and accepted all the possible adverse reactions/side effects.  Wear Seat belts while driving. You were cared for by a hospitalist during your hospital stay. If you have any questions  about your discharge medications or the care you received while you were in the hospital after you are discharged, you can call the unit and ask to speak with the hospitalist or the covering physician. Once you are discharged, your primary care physician will handle any further  medical issues. Please note that NO REFILLS for any discharge medications will be authorized once you are discharged, as it is imperative that you return to your primary care physician (or establish a relationship with a primary care physician if you do not have one).   Increase activity slowly   Complete by: As directed        Discharge Medications:   Allergies as of 06/12/2022       Reactions   Feraheme [ferumoxytol] Diarrhea   N/V/D, abdominal pain and chest discomfort.    Ferric Carboxymaltose Anaphylaxis, Shortness Of Breath, Nausea And Vomiting   Fish Allergy Hives, Itching   Fish Oil Itching   Imitrex [sumatriptan] Other (See Comments)   Muscle tension   Other Hives   Tree nuts   Peach Flavor Hives, Shortness Of Breath   Peanut Oil Hives, Itching   Peanut-containing Drug Products Hives   Pollen Extract Shortness Of Breath   Rizatriptan Other (See Comments)   Muscles tightening   Shellfish-derived Products Anaphylaxis   Benadryl Allergy [diphenhydramine Hcl] Nausea And Vomiting   Cocoa Other (See Comments)   Migraine   Gluten Meal Nausea And Vomiting, Other (See Comments)   Migraine   Milk (cow) Nausea And Vomiting   Eletriptan Other (See Comments)   Topamax [topiramate]    Eletriptan Hydrobromide Other (See Comments)   Tape Rash        Medication List     STOP taking these medications    diphenhydrAMINE 25 mg capsule Commonly known as: BENADRYL       TAKE these medications    albuterol 108 (90 Base) MCG/ACT inhaler Commonly known as: VENTOLIN HFA Inhale 2 puffs into the lungs every 4 (four) hours as needed for wheezing or shortness of breath.   cromolyn 100 MG/5ML  solution Commonly known as: GASTROCROM Take 5 mLs (100 mg total) by mouth daily before breakfast.   EPINEPHrine 0.3 mg/0.3 mL Soaj injection Commonly known as: EPI-PEN Inject 0.3 mg into the muscle as needed for anaphylaxis.   fluticasone 50 MCG/ACT nasal spray Commonly known as: FLONASE Place 2 sprays into both nostrils daily as needed for allergies or rhinitis.   Fusion Plus Caps Take 1 capsule by mouth daily.   hydrOXYzine 10 MG tablet Commonly known as: ATARAX Take 10 mg by mouth at bedtime. As needed   levocetirizine 5 MG tablet Commonly known as: XYZAL Take 1 tablet (5 mg total) by mouth every evening.   Magnesium 200 MG Tabs Take 200 mg by mouth daily.   Nurtec 75 MG Tbdp Generic drug: Rimegepant Sulfate Take 1 tablet by mouth as directed. Every other day   Olopatadine HCl 0.2 % Soln Commonly known as: Pataday Place 1 drop into both eyes daily as needed.   predniSONE 10 MG tablet Commonly known as: DELTASONE Take 1 tablet (10 mg total) by mouth 2 (two) times daily with a meal for 5 days.   Ryaltris 161-09 MCG/ACT Susp Generic drug: Olopatadine-Mometasone 2 sprays each nostril twice a day as needed for runny or stuffy nose.   sertraline 25 MG tablet Commonly known as: ZOLOFT Take 1 tablet (25 mg total) by mouth daily.         The results of significant diagnostics from this hospitalization (including imaging, microbiology, ancillary and laboratory) are listed below for reference.    Procedures and Diagnostic Studies:   DG Chest 1 View  Result Date: 06/11/2022 CLINICAL DATA:  Shortness of breath.  Chest pain. EXAM: CHEST  1 VIEW COMPARISON:  03/24/2022 FINDINGS: Midline trachea. Normal heart size and mediastinal contours. No pleural effusion or pneumothorax. Clear lungs. IMPRESSION: No active disease. Electronically Signed   By: Jeronimo Greaves M.D.   On: 06/11/2022 14:07     Labs:   Basic Metabolic Panel: Recent Labs  Lab 06/10/22 2030  06/11/22 1038 06/12/22 0630  NA 137 136 138  K 3.9 3.8 3.9  CL 106 108 110  CO2 23 22 21*  GLUCOSE 133* 91 124*  BUN 11 11 14   CREATININE 0.68 0.66 0.70  CALCIUM 9.2 8.7* 9.0   GFR Estimated Creatinine Clearance: 80.9 mL/min (by C-G formula based on SCr of 0.7 mg/dL). Liver Function Tests: No results for input(s): "AST", "ALT", "ALKPHOS", "BILITOT", "PROT", "ALBUMIN" in the last 168 hours. No results for input(s): "LIPASE", "AMYLASE" in the last 168 hours. No results for input(s): "AMMONIA" in the last 168 hours. Coagulation profile No results for input(s): "INR", "PROTIME" in the last 168 hours.  CBC: Recent Labs  Lab 06/10/22 2030 06/11/22 1038 06/12/22 0630  WBC 6.0 4.9 11.3*  NEUTROABS 3.9 3.1  --   HGB 12.6 11.7* 11.4*  HCT 38.5 35.4* 33.5*  MCV 95.5 94.7 93.8  PLT 157 130* 145*   Cardiac Enzymes: No results for input(s): "CKTOTAL", "CKMB", "CKMBINDEX", "TROPONINI" in the last 168 hours. BNP: Invalid input(s): "POCBNP" CBG: No results for input(s): "GLUCAP" in the last 168 hours. D-Dimer No results for input(s): "DDIMER" in the last 72 hours. Hgb A1c No results for input(s): "HGBA1C" in the last 72 hours. Lipid Profile No results for input(s): "CHOL", "HDL", "LDLCALC", "TRIG", "CHOLHDL", "LDLDIRECT" in the last 72 hours. Thyroid function studies No results for input(s): "TSH", "T4TOTAL", "T3FREE", "THYROIDAB" in the last 72 hours.  Invalid input(s): "FREET3" Anemia work up No results for input(s): "VITAMINB12", "FOLATE", "FERRITIN", "TIBC", "IRON", "RETICCTPCT" in the last 72 hours. Microbiology No results found for this or any previous visit (from the past 240 hour(s)).  Time coordinating discharge: 45 minutes  Signed: Mihira Tozzi  [facility] 06/12/2022, 11:26 AM

## 2022-06-13 ENCOUNTER — Encounter: Payer: Self-pay | Admitting: Family

## 2022-06-13 ENCOUNTER — Ambulatory Visit: Admitting: Family

## 2022-06-13 ENCOUNTER — Ambulatory Visit: Admitting: Family Medicine

## 2022-06-13 VITALS — BP 112/70 | Ht 63.0 in | Wt 119.0 lb

## 2022-06-13 VITALS — BP 120/80 | HR 80 | Temp 98.3°F | Resp 16 | Wt 119.9 lb

## 2022-06-13 DIAGNOSIS — J4520 Mild intermittent asthma, uncomplicated: Secondary | ICD-10-CM

## 2022-06-13 DIAGNOSIS — T781XXD Other adverse food reactions, not elsewhere classified, subsequent encounter: Secondary | ICD-10-CM | POA: Diagnosis not present

## 2022-06-13 DIAGNOSIS — Z889 Allergy status to unspecified drugs, medicaments and biological substances status: Secondary | ICD-10-CM | POA: Diagnosis not present

## 2022-06-13 DIAGNOSIS — Q7962 Hypermobile Ehlers-Danlos syndrome: Secondary | ICD-10-CM | POA: Diagnosis not present

## 2022-06-13 DIAGNOSIS — L509 Urticaria, unspecified: Secondary | ICD-10-CM

## 2022-06-13 DIAGNOSIS — H1013 Acute atopic conjunctivitis, bilateral: Secondary | ICD-10-CM

## 2022-06-13 DIAGNOSIS — T50905D Adverse effect of unspecified drugs, medicaments and biological substances, subsequent encounter: Secondary | ICD-10-CM

## 2022-06-13 DIAGNOSIS — T7840XD Allergy, unspecified, subsequent encounter: Secondary | ICD-10-CM

## 2022-06-13 DIAGNOSIS — T7800XD Anaphylactic reaction due to unspecified food, subsequent encounter: Secondary | ICD-10-CM

## 2022-06-13 DIAGNOSIS — J3089 Other allergic rhinitis: Secondary | ICD-10-CM

## 2022-06-13 LAB — TRYPTASE: Tryptase: 5.4 ug/L (ref 2.2–13.2)

## 2022-06-13 MED ORDER — LEVALBUTEROL TARTRATE 45 MCG/ACT IN AERO
INHALATION_SPRAY | RESPIRATORY_TRACT | 1 refills | Status: DC
Start: 2022-06-13 — End: 2023-01-11

## 2022-06-13 NOTE — Patient Instructions (Addendum)
MCAS -continue prednisone 20 mg once  a day from hospital -Diagnosed with hypermobility syndrome -You do have IgE mediated allergy issues (as below) however also experience reaction type symptoms despite any known triggers -Tryptase screening for mastocytosis has been normal.  Tryptase obtained during episodes has also been normal.  This would argue against a frank mastocytosis diagnosis.  -Continue levocetrizine daily at this time -Continue Cromolyn 100mg /45ml take once a day at this time taken before a meal.  Start with 5ml (standard dosing is 200mg  or 10mL) daily.  If tolerating then increase to 10ml daily.  If effective then can increase as needed to 1-3 times a day with meals.  Cromolyn is a mast cell (allergy cell) stabilizer medication.  It helps mostly with GI symptom control.   -Have access to epinephrine device in case of reaction  Adverse effect of medication -Reaction with iron infusion. Would not use that product again.  There are other iron infusion products.  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  All of these levels were very low except for the cashew.  -I do believe you have a component of oral allergy syndrome with fresh or raw fruits related to your pollen allergy (see below) -There could be an irritant effect (like strong odors or noxious odors) can irritate the airway leading to symptoms there could be a role here of airborne food particles that may be inhaled irritating the airway -Have access to self-injectable epinephrine (Epipen or AuviQ) 0.3mg  at all times -Follow emergency action plan in case of allergic reaction -Xolair monthly injections discussed  today as management option.  Xolair is now indicated for food allergy management.  -I will send a message to Tammy to see where we are in the process of you getting your Xolair injections. You can discuss your concerns of Xolair with Dr. Delorse Lek  -We have discussed the following in regards to foods:   Allergy: food allergy is when you have eaten a food, developed an allergic reaction after eating the food and have IgE to the food (positive food testing either by skin testing or blood testing).  Food allergy could lead to life threatening symptoms  Sensitivity: occurs when you have IgE to a food (positive food testing either by skin testing or blood testing) but is a food you eat without any issues.  This is not an allergy and we recommend keeping the food in the diet  Intolerance: this is when you have negative testing by either skin testing or blood testing thus not allergic but the food causes symptoms (like belly pain, bloating, diarrhea etc) with ingestion.  These foods should be avoided to prevent symptoms.    Asthma -Have access to albuterol inhaler 2 puffs every 4-6 hours as needed for cough/wheeze/shortness of breath/chest tightness.  May use 15-20 minutes prior to activity.   Monitor frequency of use.   - will try to see if we can get Xopenex approved due to your possible reaction to albuterl - continue prednisone as above  Allergic rhinitis -Continue avoidance measures for dust mite, dog dander, grass pollen, tree pollen, weed pollen, ragweed pollen, and mold.  -Continue Xyzal 5 mg once a day  -Use Ryaltris 2 sprays each nostril twice  a day as needed for runny or stuffy nose.  This is a combination spray with mometasone (nasal steroid for congestion control) and olopatadine (nasal antihistamine for drainage control).  This goes to a specialty pharmacy.  Sample provided today.  -Consider saline nasal rinses as needed for nasal symptoms. Use this before any medicated nasal sprays for best  result  Allergic conjunctivitis -Use Pataday one drop in each eye once a day as needed for red, itchy eyes  Urticaria (hives) - use Antihistamines as above - you can use Hydroxyzine when needed for hive control.  This medication is usually sedating.   - Discussed Xolair as a way to better manage hives.    Follow up in  1-2 weeks or sooner if needed with Dr. Delorse Lek.

## 2022-06-13 NOTE — Telephone Encounter (Signed)
Called patient - DOB verified - advised courtesy call to see how she was doing since her ED visit on 06/11/22 which she sent a myChart message to Dr. Delorse Lek on 06/12/22.  Patient stated she has little facial swelling now and her breathing is much better.  Patient advised a follow up office visit is available this afternoon - to further evaluate her facial swelling and allergic reaction to Albuterol (Ventolin) inhaler.  Scheduled appointment: today, Monday @ 1:30 pm w/Chrissy.  Patient verbalized understanding, no further questions.  Forwarding message to Chrissy as update.

## 2022-06-13 NOTE — Progress Notes (Signed)
[address] [place] [address] Dept: [phone]  FOLLOW UP NOTE  Patient ID: Theresa Cruz, female    DOB: December 30, 1985  Age: 37 y.o. MRN: [id number] Date of Office Visit: 06/13/2022  Assessment  Chief Complaint: Follow-up (ED visit- having feeling of moving slowly and phasing out, foggy, /Morning after face was swollen, trouble breathing, throat issues. Back to ER treated with different medications and had reactions to some medications albuterol specifically, grape ceterizine, etc.)  HPI Theresa Cruz is a 37 year old female who presents today for a hospital follow-up of allergic reaction-initial encounter.  She was last seen on April 06, 2022 by Dr. Delorse Lek for MCAS, adverse effect of medication, anaphylaxis due to food, oral allergy syndrome, asthma, allergic rhinitis, allergic conjunctivitis, and urticaria.  She denies any new diagnosis or surgery since her last office visit.  She reports that she went to the emergency room May 17 for spatial  zoning.  It felt like if she turned her head everything lagged behind.  She had a CAT scan that she reports was clear and they gave her fluids and meclizine for nausea that she did not have.  She wanted them to do an EEG, but she reports that they are not capable to do this at night.  As she was getting discharged from the emergency room her throat started to bother her and she was having chest pain.  She reports that they told her to sleep it off.  It 2:00 A.M. when she went home.  At 9 AM she woke up with swelling on her face.  The right side was worse than the left side.  Her her nose was completely stopped up.  Breathing through her mouth was difficult.  She called EMS and at the the time she reports EMS instructed her to use her EpiPen, but she was at her car waiting for EMS and her EpiPen was in her house.  She reports that she was instructed to not go back into the house.  Her swelling went down, but her breathing never changed.  She  reports that she was given Solu-Medrol and a few drops of grape flavored Zyrtec before she realized what she was going to take.  She reports that she is allergic to fruit.  She then asked if she could take her own levocetirizine instead.  She did not notice any changes in her breathing.  She was given 2 puffs of albuterol and in 1 to 2 minutes after that she noticed throat swelling and she was not able to swallow.  She was given epinephrine.  She is not sure if the epinephrine took because it was not left there long.  She felt like the jab came right out.  She also reports that she was given Toradol and lidocaine to help numb her throat.  At that time she reports that the emergency room wanted to discharge her, but she did not feel comfortable or safe to go home so they admitted her.  She then reports that she was given Solu-Medrol and 2 breathing treatments of albuterol.  1 breathing treatment was at 2 AM and the other one at 8 AM.  Before the second breathing treatment she woke up with her cheeks red and raised.  She had swelling around her eyes and her lips were swollen.  She was given prednisone.  She  reports that it was then recommended that she be discharged because they could not visibly see anything and it was felt to  be due to anxiety and to make sure that she takes her medicine.    She wonders if she is allergic to saline.  The first day she was given 2 bags at a rapid rate in the next day she was given half a bag.  When she was admitted she was given more at a slower rate.  She felt that when she was on the more constant rate her symptoms were worse.  Hospital note from Jun 11, 2022 through Jun 12, 2022 shows:  "Recommendations at discharge:  Continue prednisone for 5 days Ensure compliance to your psychiatric meds Follow-up with allergist as an outpatient.     Brief narrative: Theresa Cruz is a 37 y.o. female with history of allergies to 20 different agents, asthma, fibromyalgia,  generalized anxiety disorder, OCD, iron deficiency anemia, migraine.  Patient follows up with allergist Thermon Leyland NP.  In 2021, she had comprehensive allergens test done and was found to have positive allergy to several foods including nuts. It seems she was recently in process of initiation of a medicine by the name [facility]. 5/17, patient presented to the ED with complaint of not feeling well for last few weeks, auras, intermittent lightheadedness without headache or LOC.  She is to feel the symptoms intermittently only but lately feeling constantly.  No history of drug use. Neurological exam was unremarkable.  CBC, BMP and CT head was unremarkable.  She did not have any symptoms or signs of infection.  Patient had no history of seizure but she was concerned with the possibility of that.  EDP discussed the case with neurologist Dr. Otelia Limes who suspected complex migraine.  Inpatient management was not recommended and patient was discharged home to follow-up with neurologist as an outpatient.  While in the ED, she was given a dose of meclizine. 5/18, patient was brought to the ED by EMS with complaint of facial swelling, bilateral eyelid swelling difficulty breathing, palpitation, chest tightness, throat tightness, dysphagia and swollen face which he noticed when she woke up at 9 AM this morning.  Patient has a long list of allergies but she does not think she was exposed to any of them lately.  She suspects she could be having an allergy to meclizine she got in the ED the previous day on 5/17.  She has EpiPen at home but never had to administer it.   While at ED Triage, patient was noted to have facial swelling.  She was not in respiratory distress. She reported significant improvement in her initial symptoms but he still complain of itching of throat and chest tightness. In the ED, patient was afebrile, heart rate 84, blood pressure 136/92 currently on room air Labs showed unremarkable CBC and BMP Chest  x-ray unremarkable. Patient was given 1 dose of EpiPen and observed.  She also received 1 dose of IV Solu-Medrol 125 mg, albuterol, Toradol, viscous lidocaine.  After observation for about 3+ hours, patient still felt symptomatic with throat itching and chest tightness.  Patient and family were not comfortable with discharge.   At the request of patient, she was kept on overnight Observation under Hospitalist Service. 5/18, at the time of my evaluation, patient was propped up in bed.  She was not in any respite distress. But she still complained of chest tightness, difficulty swallowing.  She showed me the pictures from this morning.  Evidently had facial swelling that morning.  Swelling had improved by the time of my evaluation.    Subjective: Patient was seen  and examined this morning.  Mother at bedside. Patient slept well.  She states she woke up this morning with redness in her face.  Feels itching in her throat.  She believes she is going through an allergic reaction and was wondering if she could use the EpiPen.  At the time of my evaluation, I did not see any evidence of allergic reaction.    Assessment/Plan: Principal Problem:   Allergic reaction   Allergic reaction H/o allergy to multiple agents Presented with facial swelling, eyelid swelling, chest tightness, throat tightness, dysphagia Unclear etiology of allergy response this time. Patient suspected this reaction could be secondary to meclizine that she received in the ED the previous day on 5/17. In the ED, she was given 1 dose of EpiPen, IV Solu-Medrol 125 mg, albuterol, Toradol, viscous lidocaine.  Clinically, swelling seems to be improving.  She is not in respiratory distress.   Kept on overnight observation at the request of patient and family. She was also started on oral prednisone which she has been able to tolerate. She does not have any objective evidence of allergy response in our monitoring. Discharge on 5 days of  oral prednisone 10 mg twice daily.  EpiPen to be used if needed. Patient follows up with allergist Thermon Leyland NP.  In 2021, she had comprehensive allergens test done and was found to have positive allergy to several foods including nuts. It seems she was recently in process of initiation of [facility] Follow-up with allergist as an outpatient.   Chronic iron deficiency anemia Supposed to be on regular iron supplement at home but she states her ferritin level does not build up with that and she has been getting iron infusion periodically.  Asthma PTA on levocetirizine daily, bronchodilators PRN, Flonase as needed   Fibromyalgia generalized anxiety disorder OCD PTA on Zoloft 25 mg daily, Wellbutrin 150 mg daily.  However she was not taking it regularly as she supposed to.  Recommended to ensure compliance."  MCAS: She reports that she has read articles about MCAS she reports that she does take levocetirizine daily and does take cromolyn 5 mL without any problems, but mentions that she does not take it consistently because her appetite and timing of  eating is a challenge.  She wonders if she really needs to wait 30 minutes after taking the cromolyn before eating.  Reviewed instructions and it does recommend waiting 30 minutes before eating after taking cromolyn.  She does have an EpiPen.  She also mentions that she does not have much food issues.  She is having a colonoscopy and endoscopy next month on June 14 and wonders if she is going to have issues with anesthesia.  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  She reports that she is avoiding all nuts, all fruit, chocolate, sesame and legumes.  She is also not eating dairy, but she can tolerate light cheeses such as Swiss cheese, provolone, and mozzarella.  She also avoids gluten..  She reports the reason they want to do the endoscopy is to see if she really has gluten sensitivity.   They want her to eat foods with gluten in it, but she is not sure if she is willing to do this due to the symptoms that she gets when she eats food with gluten in it.  She is interested in starting Xolair injections, but has not heard anything further about when she can start the Xolair injections.  She also has some concerns she would like to speak with Dr. Delorse Lek about Xolair before starting.  Asthma: She reports that she is now not certain what to do since having a possible reaction with albuterol.  She reports prior to this she had not used her albuterol in a year.  She has never tried Xopenex.  She reports that she continues to have off-and-on tightness in her chest.  She was prescribed prednisone with 3 different directions while she has been in the hospital recently.  She has just been taking prednisone 20 mg once a day since she has done well with this.  Allergic rhinitis: She continues to take Xyzal 5 mg once a day and has Ryaltris to use as needed.  She reports a little nasal congestion this morning and denies rhinorrhea and postnasal drip.  Urticaria: She does continue to take Xyzal as above.  She reports itching with erythema yesterday morning.    Drug Allergies:  Allergies  Allergen Reactions   Feraheme [Ferumoxytol] Diarrhea    N/V/D, abdominal pain and chest discomfort.    Ferric Carboxymaltose Anaphylaxis, Shortness Of Breath and Nausea And Vomiting   Fish Allergy Hives and Itching   Fish Oil Itching   Imitrex [Sumatriptan] Other (See Comments)    Muscle tension   Other Hives    Tree nuts   Peach Flavor Hives and Shortness Of Breath   Peanut Oil Hives and Itching   Peanut-Containing Drug Products Hives   Pollen Extract Shortness Of Breath   Rizatriptan Other (See Comments)    Muscles tightening   Shellfish-Derived Products Anaphylaxis   Benadryl Allergy [Diphenhydramine Hcl] Nausea And Vomiting   Cocoa Other (See Comments)    Migraine   Gluten Meal Nausea And Vomiting  and Other (See Comments)    Migraine   Milk (Cow) Nausea And Vomiting   Eletriptan Other (See Comments)   Topamax [Topiramate]    Eletriptan Hydrobromide Other (See Comments)   Tape Rash    Review of Systems: Review of Systems  Constitutional:  Negative for chills and fever.  HENT:         Reports a little nasal congestion this morning. Denies rhinorrhea and post nasal drip  Eyes:        Denies itchy watery eyes  Respiratory:  Negative for cough, shortness of breath and wheezing.        Reports tightness in chest still off/on. Denies cough,wheeze, and shortness of breath  Cardiovascular:  Negative for chest pain and palpitations.  Gastrointestinal:  Positive for nausea. Negative for diarrhea and vomiting.       Reports that she was previously having nausea, but not now. Also denies vomiting and diarrhea  Skin:  Positive for itching.       Reports itching yesterday morning while in the hospital   Neurological:  Positive for headaches.       Reports headache yesterday  Endo/Heme/Allergies:  Positive for environmental allergies.     Physical Exam: BP 120/80   Pulse 80   Temp 98.3 F (36.8 C) (Temporal)   Resp 16   Wt 119 lb 14.4 oz (54.4 kg)   SpO2 98%   BMI 21.24 kg/m    Physical Exam Constitutional:      Appearance: Normal appearance.  HENT:     Head: Normocephalic and atraumatic.  Eyes:     Conjunctiva/sclera: Conjunctivae normal.  Cardiovascular:     Rate and Rhythm: Normal rate.  Pulmonary:     Comments: Able  to speak in full sentences Skin:    Comments: No erythema, rashes or urticarial lesions noted on exposed skin. Mild swelling noted under both eyes  Neurological:     Mental Status: She is alert and oriented to person, place, and time.  Psychiatric:        Mood and Affect: Mood normal.        Behavior: Behavior normal.        Thought Content: Thought content normal.        Judgment: Judgment normal.     Diagnostics:  None  Assessment and  Plan: 1. Allergic reaction, subsequent encounter   2. Anaphylaxis due to food, subsequent encounter   3. Adverse effect of drug, subsequent encounter   4. Pollen-food allergy, subsequent encounter   5. Mild intermittent asthma without complication   6. Other allergic rhinitis   7. Urticaria   8. Allergic conjunctivitis of both eyes     Meds ordered this encounter  Medications   levalbuterol (XOPENEX HFA) 45 MCG/ACT inhaler    Sig: Inhale 2 puffs every 6 hours as needed for wheezing, tightness in chest, or shortness of breath    Dispense:  1 each    Refill:  1    Patient Instructions  MCAS -continue prednisone 20 mg once  a day from hospital -Diagnosed with hypermobility syndrome -You do have IgE mediated allergy issues (as below) however also experience reaction type symptoms despite any known triggers -Tryptase screening for mastocytosis has been normal.  Tryptase obtained during episodes has also been normal.  This would argue against a frank mastocytosis diagnosis.  -Continue levocetrizine daily at this time -Continue Cromolyn 100mg /54ml take once a day at this time taken before a meal.  Start with 5ml (standard dosing is 200mg  or 10mL) daily.  If tolerating then increase to 10ml daily.  If effective then can increase as needed to 1-3 times a day with meals.  Cromolyn is a mast cell (allergy cell) stabilizer medication.  It helps mostly with GI symptom control.   -Have access to epinephrine device in case of reaction  Adverse effect of medication -Reaction with iron infusion. Would not use that product again.  There are other iron infusion products.  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  All of these levels were very low except for the cashew.  -I do believe you have a component of oral allergy syndrome with fresh or raw fruits related to your pollen allergy (see below) -There could be an irritant effect (like strong odors or noxious odors) can irritate the airway leading to symptoms there could be a role here of airborne food particles that may be inhaled irritating the airway -Have access to self-injectable epinephrine (Epipen or AuviQ) 0.3mg  at all times -Follow emergency action plan in case of allergic reaction -Xolair monthly injections discussed today as management option.  Xolair is now indicated for food allergy management.  -I will send a message to Tammy to see where we are in the process of you getting your Xolair injections. You can discuss your concerns of Xolair with Dr. Delorse Lek  -We have discussed the following in regards to foods:   Allergy: food allergy is when you have eaten a  food, developed an allergic reaction after eating the food and have IgE to the food (positive food testing either by skin testing or blood testing).  Food allergy could lead to life threatening symptoms  Sensitivity: occurs when you have IgE to a food (positive food testing either by skin testing or blood testing) but is a food you eat without any issues.  This is not an allergy and we recommend keeping the food in the diet  Intolerance: this is when you have negative testing by either skin testing or blood testing thus not allergic but the food causes symptoms (like belly pain, bloating, diarrhea etc) with ingestion.  These foods should be avoided to prevent symptoms.    Asthma -Have access to albuterol inhaler 2 puffs every 4-6 hours as needed for cough/wheeze/shortness of breath/chest tightness.  May use 15-20 minutes prior to activity.   Monitor frequency of use.   - will try to see if we can get Xopenex approved due to your possible reaction to albuterl -  continue prednisone as above  Allergic rhinitis -Continue avoidance measures for dust mite, dog dander, grass pollen, tree pollen, weed pollen, ragweed pollen, and mold.  -Continue Xyzal 5 mg once a day  -Use Ryaltris 2 sprays each nostril twice a day as needed for runny or stuffy nose.  This is a combination spray with mometasone (nasal steroid for congestion control) and olopatadine (nasal antihistamine for drainage control).  This goes to a specialty pharmacy.  Sample provided today.  -Consider saline nasal rinses as needed for nasal symptoms. Use this before any medicated nasal sprays for best result  Allergic conjunctivitis -Use Pataday one drop in each eye once a day as needed for red, itchy eyes  Urticaria (hives) - use Antihistamines as above - you can use Hydroxyzine when needed for hive control.  This medication is usually sedating.   - Discussed Xolair as a way to better manage hives.    Follow up in  1-2 weeks or sooner if needed with Dr. Delorse Lek.  Return in about 2 days (around 06/15/2022), or if symptoms worsen or fail to improve.    Thank you for the opportunity to care for this patient.  Please do not hesitate to contact me with questions.  Nehemiah Settle, FNP [facility]

## 2022-06-13 NOTE — Telephone Encounter (Signed)
Thank you :)

## 2022-06-13 NOTE — Patient Instructions (Signed)
Good to see you Please continue getting your eyes checked on a yearly basis  You can try taking the iron with something acidic   Please send me a message in MyChart with any questions or updates.  Please see the clinic back as needed.   --Dr. Jordan Likes

## 2022-06-13 NOTE — Progress Notes (Signed)
  Theresa Cruz - 37 y.o. female MRN [id number]  Date of birth: October 27, 1985  SUBJECTIVE:  Including CC & ROS.  No chief complaint on file.   Theresa Cruz is a 37 y.o. female that is following up for her Ehlers-Danlos syndrome.  She had an allergic reaction was hospitalized overnight.  She is established with an allergy clinic.  She has established an appointment with a POTS clinic at [facility].  She does have fatigue and did not tolerate the premedication for her iron infusion.    Review of Systems See HPI   HISTORY: Past Medical, Surgical, Social, and Family History Reviewed & Updated per EMR.   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 Counseled on avoidance. She will bring benadryl and Epipen to [place]. She has allergy alert cards written in mandarin to bring. Formatting of this note might be different from the original. Last Assessment & Plan: Formatting of this note might be different from the original. Counseled on avoidance. She will bring    Urticaria     Past Surgical History:  Procedure Laterality Date   NO PAST SURGERIES       PHYSICAL EXAM:  VS: BP 112/70   Ht 5\' 3"  (1.6 m)   Wt 119 lb (54 kg)   BMI 21.08 kg/m  Physical Exam Gen: NAD, alert, cooperative with exam, well-appearing MSK:  Neurovascularly  intact       ASSESSMENT & PLAN:   Ehlers-Danlos syndrome type III Has been able to establish with an allergist and has an appointment for her POTS syndrome.  Continues to get workup for her low ferritin as she has scopes pending. -Counseled on home exercise therapy and supportive care

## 2022-06-13 NOTE — Assessment & Plan Note (Signed)
Has been able to establish with an allergist and has an appointment for her POTS syndrome.  Continues to get workup for her low ferritin as she has scopes pending. -Counseled on home exercise therapy and supportive care

## 2022-06-14 ENCOUNTER — Encounter: Payer: Self-pay | Admitting: Emergency Medicine

## 2022-06-14 ENCOUNTER — Emergency Department

## 2022-06-14 ENCOUNTER — Other Ambulatory Visit: Payer: Self-pay

## 2022-06-14 ENCOUNTER — Emergency Department
Admission: EM | Admit: 2022-06-14 | Discharge: 2022-06-14 | Disposition: A | Discharge status: Home / Self Care | Attending: Emergency Medicine | Admitting: Emergency Medicine

## 2022-06-14 DIAGNOSIS — R55 Syncope and collapse: Secondary | ICD-10-CM | POA: Diagnosis not present

## 2022-06-14 DIAGNOSIS — I10 Essential (primary) hypertension: Secondary | ICD-10-CM | POA: Diagnosis not present

## 2022-06-14 DIAGNOSIS — R002 Palpitations: Secondary | ICD-10-CM | POA: Insufficient documentation

## 2022-06-14 DIAGNOSIS — R42 Dizziness and giddiness: Secondary | ICD-10-CM | POA: Diagnosis present

## 2022-06-14 DIAGNOSIS — G90A Postural orthostatic tachycardia syndrome (POTS): Secondary | ICD-10-CM | POA: Diagnosis not present

## 2022-06-14 LAB — CBC
HCT: 37.8 % (ref 36.0–46.0)
Hemoglobin: 12.8 g/dL (ref 12.0–15.0)
MCH: 31.8 pg (ref 26.0–34.0)
MCHC: 33.9 g/dL (ref 30.0–36.0)
MCV: 93.8 fL (ref 80.0–100.0)
Platelets: 183 10*3/uL (ref 150–400)
RBC: 4.03 MIL/uL (ref 3.87–5.11)
RDW: 13.1 % (ref 11.5–15.5)
WBC: 7 10*3/uL (ref 4.0–10.5)
nRBC: 0 % (ref 0.0–0.2)

## 2022-06-14 LAB — BASIC METABOLIC PANEL
Anion gap: 9 (ref 5–15)
BUN: 13 mg/dL (ref 6–20)
CO2: 23 mmol/L (ref 22–32)
Calcium: 8.9 mg/dL (ref 8.9–10.3)
Chloride: 103 mmol/L (ref 98–111)
Creatinine, Ser: 0.71 mg/dL (ref 0.44–1.00)
GFR, Estimated: >60 mL/min (ref >60)
Glucose, Bld: 121 mg/dL — ABNORMAL HIGH (ref 70–99)
Potassium: 3.3 mmol/L — ABNORMAL LOW (ref 3.5–5.1)
Sodium: 135 mmol/L (ref 135–145)

## 2022-06-14 LAB — TROPONIN I (HIGH SENSITIVITY): Troponin I (High Sensitivity): <2 ng/L (ref <18)

## 2022-06-14 MED ORDER — METOPROLOL TARTRATE 25 MG PO TABS: 12.5000 mg | ORAL_TABLET | Freq: Two times a day (BID) | ORAL | 0 refills | Status: AC | PRN

## 2022-06-14 NOTE — ED Triage Notes (Signed)
Pt here with dizziness and tachycardia. Pt states she was at work when her heart began racing up to the 150s and then she said she became dizzy. Pt denies LOC but states she felt like she was going to. Pt states her legs are cool and weak at the moment. Pt states some mild cp.

## 2022-06-14 NOTE — ED Provider Notes (Signed)
[facility] Provider Note   Event Date/Time   First MD Initiated Contact with Patient 06/14/22 1404     (approximate) History  Dizziness  HPI Theresa Cruz is a 37 y.o. female with a stated past medical history of POTS who presents complaining of an episode of tachycardia, hypertension, and presyncope.  Patient states that she has been feeling somewhat nauseous throughout the morning including lack of sleep the night before while she was at work sitting down she had an episode of palpitations and when checking her fitness watch found her heart rate to be in the 150s.  Patient then took her blood pressure and with the highest reading being systolics in the 150s.  Patient states that she was also having lower extremity weakness and had to sit down before passing out.  Patient states that she never lost consciousness nor fell or had any trauma.  Patient states that the symptoms lasted for approximately 30 minutes and resolved spontaneously.  Patient denies taking any medications for the symptoms. ROS: Patient currently denies any vision changes, tinnitus, difficulty speaking, facial droop, sore throat, abdominal pain, nausea/vomiting/diarrhea, dysuria, or weakness/numbness/paresthesias in any extremity   Physical Exam  Triage Vital Signs: ED Triage Vitals  Enc Vitals Group     BP 06/14/22 1332 (!) 118/92     Pulse Rate 06/14/22 1332 83     Resp 06/14/22 1332 16     Temp 06/14/22 1332 99.3 F (37.4 C)     Temp Source 06/14/22 1332 Oral     SpO2 06/14/22 1332 97 %     Weight 06/14/22 1324 119 lb 0.8 oz (54 kg)     Height 06/14/22 1324 5\' 3"  (1.6 m)     Head Circumference --      Peak Flow --      Pain Score 06/14/22 1324 3     Pain Loc --      Pain Edu? --      Excl. in GC? --    Most recent vital signs: Vitals:   06/14/22 1332  BP: (!) 118/92  Pulse: 83  Resp: 16  Temp: 99.3 F (37.4 C)  SpO2: 97%   General: Awake, oriented x4. CV:  Good peripheral  perfusion.  Resp:  Normal effort.  Abd:  No distention.  Other:  Well-developed, well-nourished African-American middle-aged female laying in bed in no acute distress ED Results / Procedures / Treatments  Labs (all labs ordered are listed, but only abnormal results are displayed) Labs Reviewed  BASIC METABOLIC PANEL - Abnormal; Notable for the following components:      Result Value   Potassium 3.3 (*)    Glucose, Bld 121 (*)    All other components within normal limits  CBC  POC URINE PREG, ED  TROPONIN I (HIGH SENSITIVITY)   EKG ED ECG REPORT I, Merwyn Katos, the attending physician, personally viewed and interpreted this ECG. Date: 06/14/2022 EKG Time: 1325 Rate: 84 Rhythm: normal sinus rhythm QRS Axis: normal Intervals: normal ST/T Wave abnormalities: normal Narrative Interpretation: no evidence of acute ischemia RADIOLOGY ED MD interpretation: 2 view chest x-ray interpreted by me shows no evidence of acute abnormalities including no pneumonia, pneumothorax, or widened mediastinum -Agree with radiology assessment Official radiology report(s): DG Chest 2 View  Result Date: 06/14/2022 CLINICAL DATA:  Dizziness and chest pain. EXAM: CHEST - 2 VIEW COMPARISON:  06/11/2022 FINDINGS: The cardiac silhouette, mediastinal and hilar contours are normal. The lungs are clear. No pleural  effusions. No pulmonary lesions. No pneumothorax. The bony thorax is intact. IMPRESSION: No acute cardiopulmonary findings. Electronically Signed   By: Rudie Meyer M.D.   On: 06/14/2022 14:01   PROCEDURES: Critical Care performed: No Procedures MEDICATIONS ORDERED IN ED: Medications - No data to display IMPRESSION / MDM / ASSESSMENT AND PLAN / ED COURSE  I reviewed the triage vital signs and the nursing notes.                             The patient is on the cardiac monitor to evaluate for evidence of arrhythmia and/or significant heart rate changes. Patient's presentation is most consistent  with acute presentation with potential threat to life or bodily function. Patient presents with complaints of syncope/presyncope ED Workup:  CBC, BMP, Troponin, ECG, CXR Differential diagnosis includes HF, ICH, seizure, stroke, HOCM, ACS, aortic dissection, malignant arrhythmia, or GI bleed. Findings: No evidence of acute laboratory abnormalities.  Troponin negative x1 EKG: No e/o STEMI. No evidence of Brugadas sign, delta wave, epsilon wave, significantly prolonged QTc, or malignant arrhythmia.  Disposition: Discharge. Patient is at baseline at this time. Return precautions expressed and understood in person. Advised follow up with primary care provider or clinic physician in next 24 hours.   FINAL CLINICAL IMPRESSION(S) / ED DIAGNOSES   Final diagnoses:  Palpitations  Near syncope  POTS (postural orthostatic tachycardia syndrome)   Rx / DC Orders   ED Discharge Orders          Ordered    Ambulatory referral to Cardiology       Comments: If you have not heard from the Cardiology office within the next 72 hours please call [phone].   06/14/22 1438    metoprolol tartrate (LOPRESSOR) 25 MG tablet  2 times daily PRN        06/14/22 1440           Note:  This document was prepared using Dragon voice recognition software and may include unintentional dictation errors.   Merwyn Katos, MD 06/14/22 [phone]

## 2022-06-14 NOTE — ED Triage Notes (Signed)
First Nurse Note;  Pt via EMS from work. Pt started had a sudden onset of lightheadedness and dizziness. Pt has a hx of POTS. Pt is also taking steroids for an allergic reaction.  102 HR  BP 149/108 195 CBG  Pt is A&Ox4 and NAD

## 2022-06-15 ENCOUNTER — Encounter: Payer: Self-pay | Admitting: Allergy

## 2022-06-15 ENCOUNTER — Ambulatory Visit: Admitting: Allergy

## 2022-06-15 VITALS — BP 98/70 | HR 68 | Temp 98.2°F | Resp 16

## 2022-06-15 DIAGNOSIS — Z889 Allergy status to unspecified drugs, medicaments and biological substances status: Secondary | ICD-10-CM | POA: Diagnosis not present

## 2022-06-15 DIAGNOSIS — T7840XD Allergy, unspecified, subsequent encounter: Secondary | ICD-10-CM | POA: Diagnosis not present

## 2022-06-15 DIAGNOSIS — J4520 Mild intermittent asthma, uncomplicated: Secondary | ICD-10-CM

## 2022-06-15 DIAGNOSIS — T781XXD Other adverse food reactions, not elsewhere classified, subsequent encounter: Secondary | ICD-10-CM

## 2022-06-15 DIAGNOSIS — H1013 Acute atopic conjunctivitis, bilateral: Secondary | ICD-10-CM

## 2022-06-15 DIAGNOSIS — T50905D Adverse effect of unspecified drugs, medicaments and biological substances, subsequent encounter: Secondary | ICD-10-CM

## 2022-06-15 DIAGNOSIS — T7800XD Anaphylactic reaction due to unspecified food, subsequent encounter: Secondary | ICD-10-CM | POA: Diagnosis not present

## 2022-06-15 DIAGNOSIS — L509 Urticaria, unspecified: Secondary | ICD-10-CM

## 2022-06-15 DIAGNOSIS — J3089 Other allergic rhinitis: Secondary | ICD-10-CM

## 2022-06-15 MED ORDER — LEVOCETIRIZINE DIHYDROCHLORIDE 5 MG PO TABS: 5.0000 mg | ORAL_TABLET | Freq: Two times a day (BID) | ORAL | 5 refills | Status: DC

## 2022-06-15 MED ORDER — LEVALBUTEROL HCL 1.25 MG/3ML IN NEBU: 1.2500 mg | INHALATION_SOLUTION | Freq: Four times a day (QID) | RESPIRATORY_TRACT | 1 refills | Status: AC | PRN

## 2022-06-15 NOTE — Patient Instructions (Signed)
MCAS  Xyzal bid Cromolyn daily with plan to titrate up Pepcid add in after known tolerance of cromoyln [facility] referral   -Diagnosed with hypermobility syndrome -You do have IgE mediated allergy issues (as below) however also experience reaction type symptoms despite any known triggers -Tryptase screening for mastocytosis has been normal.  Tryptase obtained during episodes has also been normal.  This would argue against a frank mastocytosis diagnosis.  -Continue levocetrizine daily at this time -Continue Cromolyn 100mg /50ml take once a day at this time taken before a meal.  Start with 5ml (standard dosing is 200mg  or 10mL) daily.  If tolerating then increase to 10ml daily.  If effective then can increase as needed to 1-3 times a day with meals.  Cromolyn is a mast cell (allergy cell) stabilizer medication.  It helps mostly with GI symptom control.   -Have access to epinephrine device in case of reaction  Adverse effect of medication -Reaction with iron infusion. Would not use that product again.  There are other iron infusion products.  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  All of these levels were very low except for the cashew.  -I do believe you have a component of oral allergy syndrome with fresh or raw fruits related to your pollen allergy (see below) -There could be an irritant effect (like strong odors or noxious odors) can irritate the airway leading to symptoms there could be a role here of airborne food particles that may be inhaled irritating the airway -Have access to self-injectable epinephrine (Epipen or AuviQ) 0.3mg  at all times -Follow emergency action plan  in case of allergic reaction -Xolair monthly injections discussed today as management option.  Xolair is now indicated for food allergy management.  -I will send a message to Tammy to see where we are in the process of you getting your Xolair injections. You can discuss your concerns of Xolair with Dr. Delorse Lek  -We have discussed the following in regards to foods:   Allergy: food allergy is when you have eaten a food, developed an allergic reaction after eating the food and have IgE to the food (positive food testing either by skin testing or blood testing).  Food allergy could lead to life threatening symptoms  Sensitivity: occurs when you have IgE to a food (positive food testing either by skin testing or blood testing) but is a food you eat without any issues.  This is not an allergy and we recommend keeping the food in the diet  Intolerance: this is when you have negative testing by either skin testing or blood testing thus not allergic but the food causes symptoms (like belly pain, bloating, diarrhea etc) with ingestion.  These foods should be avoided to prevent symptoms.    Asthma -Have access to albuterol inhaler 2 puffs every 4-6 hours as needed for cough/wheeze/shortness of breath/chest tightness.  May use 15-20 minutes prior to activity.   Monitor frequency of use.   - will try to see if we can get Xopenex approved due to your possible reaction to albuterl - continue prednisone as above  Allergic rhinitis -Continue avoidance measures for dust mite, dog dander, grass pollen, tree pollen, weed pollen, ragweed pollen, and mold.  -Continue Xyzal 5  mg once a day  -Use Ryaltris 2 sprays each nostril twice a day as needed for runny or stuffy nose.  This is a combination spray with mometasone (nasal steroid for congestion control) and olopatadine (nasal antihistamine for drainage control).  This goes to a specialty pharmacy.  Sample provided today.  -Consider saline nasal rinses as needed for  nasal symptoms. Use this before any medicated nasal sprays for best result  Allergic conjunctivitis -Use Pataday one drop in each eye once a day as needed for red, itchy eyes  Urticaria (hives) - use Antihistamines as above - you can use Hydroxyzine when needed for hive control.  This medication is usually sedating.   - Discussed Xolair as a way to better manage hives.    Follow up in  1-2 weeks or sooner if needed with Dr. Delorse Lek.

## 2022-06-15 NOTE — Progress Notes (Unsigned)
Follow-up Note  RE: Theresa Cruz MRN: [id number] DOB: [birth year] Date of Office Visit: 06/15/2022   History of present illness: Theresa Cruz is a 37 y.o. female presenting today for follow-up of history of allergic reactions.  She also has history of adverse effects of medications, food allergy with oral allergy syndrome, allergic rhinitis with conjunctivitis, urticaria, asthma.  She presents today with her mother.  She was seen in the office on 06/13/2022 by our nurse practitioner Amada Jupiter.   She has had an eventful week.  She presented to the ED on 06/10/2022 with symptoms of feeling like her body was not moving with her and some visual auras as well as head pressure.  She was feeling like when she moved that her body was moving slower than her perception.  In the ED she was treated with p.o. meclizine, IV fluids.  She had a CBC that was unremarkable as well as a normal head CT.  Her neuro exam was unremarkable.  She was able to discharge from this visit.  However she returned to the ED the following day as she woke up with facial swelling.  Patient did show me pictures of the swelling that showed very visible periorbital swelling.  She called EMS as she also was having some difficulty breathing, palpitations, chest tightness, throat tightness.  She went outside to wait for EMS.  She states EMS advised her to give her EpiPen however it was decided she did not want to go back in the home.  EMS arrived and she was taken to the ED.  She had an unremarkable CBC and BMP as well as chest x-ray.  She was given a dose of epinephrine however there appeared to be some issues with the administration.  She also received Solu-Medrol, albuterol, Toradol with viscous lidocaine.  She did have improvements with her symptoms with this combination of medications.  She was admitted and observed overnight and she was able to discharge the following day.  She was discharged with a 5-day prednisone burst.  She was  concerned that she might have had issues with meclizine. She then presented to our clinic on 06/13/2022 for follow-up.  Where she was advised to come back and see me today as I know her history quite well. Since her last visit I have been working to get Xolair approved for her for food allergy management as well as her history of hives.  She is rightfully nervous about starting a medication like Xolair. Since her last visit she did have her iron infusion however she did develop symptoms related to this infusion.  I did talk with her hematology oncology nurse practitioner in regards to premedications for her iron infusion including benadryl, pepcid, solumedrol and a slow iron infusion.  With this she states once the benadryl push was done she started to experience "strong chest pains and then started having out-of-body experiences. it's really hard to describe, but things were moving so slowly, and I just left like I didn't have to confront my body or movements. Like to pick something up, I had to force my hands to move but they would get stuck in place at times. with the additional meds, I started cramping in my stomach really badly as if my cycle were on (it wasn't) the cramps and pain lasted at least 20-30 mins. I asked to go to the bathroom, where my bowels started moving and then I threw up four times. Shortly after that, I began to bleed as if  my cycle were on. I spent the 30 mins waiting period from the premeds in the bathroom vomiting, nauseated and having different types of bowel movements. I was not able to stand up alone and had to wheel to and from the bathroom and needed assistance walking because when I first stood up I feel right back down to the chair."  Taken from [employer].   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   She does have an appointment with [facility]  in their POTS clinic in several weeks.  After albuterol use she does report having palpitations and feeling quite jittery.  Review of systems: Review of Systems  Constitutional: Negative.        See HPI  HENT: Negative.         See HPI  Eyes: Negative.   Respiratory: Negative.         See HPI  Cardiovascular: Negative.        See HPI  Gastrointestinal: Negative.   Musculoskeletal: Negative.   Skin: Negative.   Allergic/Immunologic: Negative.   Neurological: Negative.        See HPI     All other systems negative unless noted above in HPI  Past medical/social/surgical/family history have been reviewed and are unchanged unless specifically indicated below.  No changes  Medication List: Current Outpatient Medications  Medication Sig Dispense Refill   buPROPion (WELLBUTRIN XL) 150 MG 24 hr tablet Take 150 mg by mouth daily.     clobetasol ointment (TEMOVATE) 0.05 % Apply 1 Application topically 2 (two) times daily.     cromolyn (GASTROCROM) 100 MG/5ML solution Take 5 mLs (100 mg total) by mouth daily before breakfast. 600 mL 2   EPINEPHrine 0.3 mg/0.3 mL IJ SOAJ injection Inject 0.3 mg into the muscle as needed for anaphylaxis. 2 each 1   fluticasone (FLONASE) 50 MCG/ACT nasal spray Place 2 sprays into both nostrils daily as needed for allergies or rhinitis. 16 g 5   hydrOXYzine (ATARAX) 10 MG tablet Take 10 mg by mouth at bedtime. As needed     Iron-FA-B Cmp-C-Biot-Probiotic (FUSION PLUS) CAPS Take 1 capsule by mouth daily. 30 capsule 6   levalbuterol (XOPENEX HFA) 45 MCG/ACT inhaler Inhale 2 puffs every 6 hours as needed for wheezing, tightness in chest, or shortness of breath 1 each 1   levalbuterol (XOPENEX) 1.25 MG/3ML nebulizer solution Take 1.25 mg by nebulization every 6 (six) hours as needed for wheezing or shortness of breath. 72 mL 1   levocetirizine (XYZAL) 5 MG tablet Take 1 tablet (5 mg total) by mouth every evening. 90 tablet 1   levocetirizine (XYZAL) 5 MG tablet  Take 1 tablet (5 mg total) by mouth in the morning and at bedtime. 60 tablet 5   Magnesium 200 MG TABS Take 200 mg by mouth daily.     metoprolol tartrate (LOPRESSOR) 25 MG tablet Take 0.5 tablets (12.5 mg total) by mouth 2 (two) times daily as needed (palpitations, tachycardia, hypertension). Please DO NOT take if your heart rate is <120 OR your blood pressure is below 120 on top (systolic) and/or below 70 on the bottom(diastolic) 60 tablet 0   Olopatadine HCl (PATADAY) 0.2 % SOLN Place 1 drop into both eyes daily as needed. 2.5 mL 5   Olopatadine-Mometasone (RYALTRIS) 665-25 MCG/ACT SUSP 2 sprays each nostril twice a day as needed for runny  or stuffy nose. 29 g 5   ondansetron (ZOFRAN-ODT) 4 MG disintegrating tablet Take 4 mg by mouth once.     predniSONE (DELTASONE) 10 MG tablet Take 1 tablet (10 mg total) by mouth 2 (two) times daily with a meal for 5 days. 10 tablet 0   prochlorperazine (COMPAZINE) 10 MG tablet Take 10 mg by mouth every 6 (six) hours as needed.     Rimegepant Sulfate (NURTEC) 75 MG TBDP Take 1 tablet by mouth as directed. Every other day     sertraline (ZOLOFT) 25 MG tablet Take 1 tablet (25 mg total) by mouth daily. 30 tablet 0   tranexamic acid (LYSTEDA) 650 MG TABS tablet Take 1,300 mg by mouth 3 (three) times daily.     No current facility-administered medications for this visit.     Known medication allergies: Allergies  Allergen Reactions   Feraheme [Ferumoxytol] Diarrhea    N/V/D, abdominal pain and chest discomfort.    Ferric Carboxymaltose Anaphylaxis, Shortness Of Breath and Nausea And Vomiting   Fish Allergy Hives and Itching   Fish Oil Itching   Imitrex [Sumatriptan] Other (See Comments)    Muscle tension   Other Hives    Tree nuts   Peach Flavor Hives and Shortness Of Breath   Peanut Oil Hives and Itching   Peanut-Containing Drug Products Hives   Pollen Extract Shortness Of Breath   Rizatriptan Other (See Comments)    Muscles tightening    Shellfish-Derived Products Anaphylaxis   Benadryl Allergy [Diphenhydramine Hcl] Nausea And Vomiting   Cocoa Other (See Comments)    Migraine   Gluten Meal Nausea And Vomiting and Other (See Comments)    Migraine   Milk (Cow) Nausea And Vomiting   Eletriptan Other (See Comments)   Topamax [Topiramate]    Eletriptan Hydrobromide Other (See Comments)   Tape Rash     Physical examination: Blood pressure 98/70, pulse 68, temperature 98.2 F (36.8 C), temperature source Temporal, resp. rate 16, last menstrual period 05/14/2022, SpO2 98 %.  General: Alert, interactive, appears fatigued HEENT: PERRLA Neck: Supple without lymphadenopathy. Lungs: Clear to auscultation without wheezing, rhonchi or rales. {no increased work of breathing. CV: Normal S1, S2 without murmurs. Abdomen: Nondistended, nontender. Skin: Warm and dry, without lesions or rashes. Extremities:  No clubbing, cyanosis or edema. Neuro:   Grossly intact.  Diagnositics/Labs: Labs:  Component     Latest Ref Rng 06/14/2022  Sodium     135 - 145 mmol/L 135   Potassium     3.5 - 5.1 mmol/L 3.3 (L)   Chloride     98 - 111 mmol/L 103   CO2     22 - 32 mmol/L 23   Glucose     70 - 99 mg/dL 409 (H)   BUN     6 - 20 mg/dL 13   Creatinine     8.11 - 1.00 mg/dL 9.14   Calcium     8.9 - 10.3 mg/dL 8.9   GFR, Estimated     >60 mL/min >60   Anion gap     5 - 15  9   WBC     4.0 - 10.5 K/uL 7.0   RBC     3.87 - 5.11 MIL/uL 4.03   Hemoglobin     12.0 - 15.0 g/dL 78.2   HCT     95.6 - 21.3 % 37.8   MCV     80.0 - 100.0 fL 93.8   MCH  26.0 - 34.0 pg 31.8   MCHC     30.0 - 36.0 g/dL 16.1   RDW     09.6 - 04.5 % 13.1   Platelets     150 - 400 K/uL 183   nRBC     0.0 - 0.2 % 0.0   Troponin I (High Sensitivity)     <18 ng/L <2      Assessment and plan: Allergic reaction  H/o allergy to multiple medications Food allergy Oral allergy syndrome  -Diagnosed with hypermobility syndrome -You do have IgE  mediated allergy issues (as below) however also experience reaction type symptoms despite any known triggers -Tryptase screening for mastocytosis has been normal.  Tryptase obtained during episodes has also been normal however timing of draw is in question.  This would argue against a frank mastocytosis diagnosis.  -Continue levocetrizine at twice a day dosing -Continue Cromolyn 100mg /33ml take once a day at this time  -After a week of tolerating Cromolyn recommend to add in Pepcid for secondary histamine blockade. -Have access to epinephrine device in case of reaction -Discussed placing referral for [facility] for second opinion regarding MCAS/mastocytosis -We have discussed Xolair injections for food allergy management as well as urticaria management.  We are working on this approval.  I discussed that we can plan to start this medication after she has her second opinion hopefully over the summer.  -Continue your current food avoidance -Food allergy testing has been positive to sesame, milk, pistachio, hops, pork, navy bean, Karaya gum, hazelnut, pecan, cashew, walnut, peach, kiwi, pineapple, banana with previous testing.  All of these levels were very low except for the cashew.  -I do believe you have a component of oral allergy syndrome with fresh or raw fruits related to your pollen allergy (see below) -There could be an irritant effect (like strong odors or noxious odors) can irritate the airway leading to symptoms there could be a role here of airborne food particles that may be inhaled irritating the airway -Have access to self-injectable epinephrine (Epipen or AuviQ) 0.3mg  at all times -Follow emergency action plan in case of allergic reaction  -We have discussed the following in regards to foods:   Allergy: food allergy is when you have eaten a food, developed an allergic reaction after eating the food and have IgE to the food (positive food testing either by skin testing or blood  testing).  Food allergy could lead to life threatening symptoms  Sensitivity: occurs when you have IgE to a food (positive food testing either by skin testing or blood testing) but is a food you eat without any issues.  This is not an allergy and we recommend keeping the food in the diet  Intolerance: this is when you have negative testing by either skin testing or blood testing thus not allergic but the food causes symptoms (like belly pain, bloating, diarrhea etc) with ingestion.  These foods should be avoided to prevent symptoms.    Asthma -Have access to Xopenex inhaler 2 puffs or Xopenex 1 vial via nebulizer every 4-6 hours as needed for cough/wheeze/shortness of breath/chest tightness.  May use 15-20 minutes prior to activity.   Monitor frequency of use.    Allergic rhinitis -Continue avoidance measures for dust mite, dog dander, grass pollen, tree pollen, weed pollen, ragweed pollen, and mold.  -Continue Xyzal -Use Ryaltris 2 sprays each nostril twice a day as needed for runny or stuffy nose.  This is a combination spray with mometasone (nasal steroid  for congestion control) and olopatadine (nasal antihistamine for drainage control).  This goes to a specialty pharmacy.  Sample provided today.  -Consider saline nasal rinses as needed for nasal symptoms. Use this before any medicated nasal sprays for best result  Allergic conjunctivitis -Use Pataday one drop in each eye once a day as needed for red, itchy eyes  Urticaria (hives) - use Antihistamines as above - you can use Hydroxyzine when needed for hive control.  This medication is usually sedating.   - Discussed Xolair as a way to better manage hives.    Follow up in 3 months or sooner if needed   I appreciate the opportunity to take part in Theresa Cruz's care. Please do not hesitate to contact me with questions.  Sincerely,   Margo Aye, MD Allergy/Immunology [facility]

## 2022-06-17 ENCOUNTER — Other Ambulatory Visit: Payer: Self-pay | Admitting: Allergy

## 2022-06-17 DIAGNOSIS — T7840XD Allergy, unspecified, subsequent encounter: Secondary | ICD-10-CM

## 2022-06-17 NOTE — Progress Notes (Signed)
MCAS/mastocytosis labs ordered.  She will return to clinic in about 2 weeks for lab draw/obtain urine collection jug.  Blood work has been ordered. Urine studies have been ordered separately from the blood work.

## 2022-06-21 ENCOUNTER — Telehealth: Payer: Self-pay

## 2022-06-21 NOTE — Telephone Encounter (Signed)
-----   Message from [facility] Larose Hires, MD sent at 06/17/2022  6:02 PM EDT ----- Regarding: labs I have ordered both the blood work and the urine studies to be done at some time.   Urine studies are ordered separate to be on diff req form.   She plans to come in 1-2 weeks for lab draw/urine collection jug pick up

## 2022-06-21 NOTE — Telephone Encounter (Signed)
All lab requisitions have been printed and given to the [employer].

## 2022-06-28 ENCOUNTER — Other Ambulatory Visit: Payer: Self-pay | Admitting: Allergy

## 2022-06-29 LAB — KIT (D816V) DIGITAL PCR

## 2022-06-29 LAB — N-METHYLHISTAMINE, 24 HR, U

## 2022-06-29 LAB — LEUKOTRIENE E4, 24 HR, U

## 2022-07-01 LAB — N-METHYLHISTAMINE, 24 HR, U

## 2022-07-01 LAB — PROSTAGLANDIN D2, SERUM: Prostaglandin D2, serum: 7 pg/mL

## 2022-07-01 LAB — KIT (D816V) DIGITAL PCR: CKIT Result: NEGATIVE

## 2022-07-02 LAB — LEUKOTRIENE E4, 24 HR, U
Creatinine, 24 HR, U: 1020 mg/24 h (ref 603–1783)
Urine Volume: 1200 mL

## 2022-07-02 LAB — N-METHYLHISTAMINE, 24 HR, U: Creatinine Concent. 24 Hr, U: 86 mg/dL

## 2022-07-04 ENCOUNTER — Encounter: Payer: Self-pay | Admitting: Allergy

## 2022-07-04 LAB — OTHER LAB TEST

## 2022-07-05 LAB — LEUKOTRIENE E4, 24 HR, U: Collection Duration: 24 h

## 2022-07-05 LAB — N-METHYLHISTAMINE, 24 HR, U: Collection Duration: 24 h

## 2022-07-05 NOTE — Telephone Encounter (Signed)
Attempted to call patient and was forwarded to voicemail. Left a voicemail asking for patient to return call to discuss. Responded to patient in open MyChart encounter as well.

## 2022-07-05 NOTE — Telephone Encounter (Signed)
Patients Xolair came in today and has been placed on the scheduled for 07/11/2022. Patient informed me that she sent Dr. Delorse Lek a mychart message and were wondering about her lab results. Please advise and route to [place] pool.

## 2022-07-06 ENCOUNTER — Other Ambulatory Visit

## 2022-07-06 LAB — LEUKOTRIENE E4, 24 HR, U: Creatinine Concentration,24 HR: 85 mg/dL

## 2022-07-07 ENCOUNTER — Telehealth: Payer: Self-pay | Admitting: Allergy

## 2022-07-07 NOTE — Telephone Encounter (Signed)
Patient called and needs to talk with someone about the labs and let Dr. Delorse Lek know she has not started the Pepcid yet. [phone]

## 2022-07-08 ENCOUNTER — Encounter: Payer: Self-pay | Admitting: Internal Medicine

## 2022-07-08 NOTE — Progress Notes (Signed)
Patient called due to having a reaction to a filling solution used in hospitals. Had noticed increased congestion, nasal swelling, stuffiness. Asking if she can use Flonase. Discussed it's a sensitivity/irritation. Can try Flonase or Flonase Sensimist.

## 2022-07-11 ENCOUNTER — Telehealth: Payer: Self-pay

## 2022-07-11 ENCOUNTER — Ambulatory Visit

## 2022-07-11 NOTE — Telephone Encounter (Signed)
Patient called because her labs came back and she wanted to know if her Xolair does would go up based on those results. Patient rescheduled new start appointment for 07/18/22 due to wanting to hear back from Dr. Delorse Lek before she starts the injections.

## 2022-07-11 NOTE — Telephone Encounter (Signed)
Pt informed of results and stated understanding.  

## 2022-07-11 NOTE — Telephone Encounter (Signed)
Tried calling pt was trying to leave a message and the phone called randomly ended

## 2022-07-13 ENCOUNTER — Other Ambulatory Visit: Payer: Self-pay

## 2022-07-13 MED ORDER — MONTELUKAST SODIUM 10 MG PO TABS: 10.0000 mg | ORAL_TABLET | Freq: Every day | ORAL | 5 refills | Status: DC

## 2022-07-13 MED ORDER — FAMOTIDINE 20 MG PO TABS: 20.0000 mg | ORAL_TABLET | Freq: Two times a day (BID) | ORAL | 5 refills | Status: DC

## 2022-07-13 NOTE — Telephone Encounter (Signed)
Spoke to patient about Dr. Randell Patient note. Pepcid and montelukast sent to requested pharmacy.

## 2022-07-13 NOTE — Telephone Encounter (Signed)
Pt has not been on montelukast at all. She just takes xyzal, cromlyn, and has not started the pepcid. Pt informed of results.

## 2022-07-13 NOTE — Progress Notes (Signed)
Patient made aware that medications have been sent to verified pharmacy.

## 2022-07-15 LAB — OTHER LAB TEST: PDF: 0

## 2022-07-18 ENCOUNTER — Ambulatory Visit (INDEPENDENT_AMBULATORY_CARE_PROVIDER_SITE_OTHER)

## 2022-07-18 ENCOUNTER — Telehealth: Payer: Self-pay | Admitting: *Deleted

## 2022-07-18 DIAGNOSIS — Z91018 Allergy to other foods: Secondary | ICD-10-CM

## 2022-07-18 MED ORDER — OMALIZUMAB 150 MG/ML ~~LOC~~ SOSY
375.0000 mg | PREFILLED_SYRINGE | SUBCUTANEOUS | Status: DC
  Administered 2022-07-18: 375 mg via SUBCUTANEOUS

## 2022-07-18 NOTE — Telephone Encounter (Signed)
Patient's labs resulted today please advise on results.

## 2022-07-18 NOTE — Telephone Encounter (Signed)
Physical exam and vitals were stable throughout entire time in office.   Thank you for following up with Theresa Cruz!

## 2022-07-18 NOTE — Telephone Encounter (Signed)
Patient started Xolair today and received 375mg  dosage. She received 1 150mg  inj in the RUA and 1 75mg  inj in the RLA, and 1 inj of 150mg  in the LUA. Shortly after at 2:30 patient started complaining of weird feeling in throat. Vitals were obtained at 2:34 pm and were stable as BP 118/80, P 76, R 18, O2 97. She was complaining of difficulty swallowing and that it was not improved. Epinephrine was administered at 2:36 pm. She then complained of chest tightness and was given a Lev albuterol breathing treatment at 2:40 pm. Vitals obtained at 2:53 pm, BP 108/70, P 76, RR 18, O2 100. Vitals obtained at 3:00 pm, BP 128/88, P 82, RR 18, O2 98. Vitals at 3:14 pm BP 122/80, P 82, RR 18, O2 100, experiencing some fluttering in her chest and heart palpitations from the Epinephrine. Final vitals at 3:36 pm with BP 122/80, P 84, RR 18, O2 100 and symptom free. Patient discharged at 3:42pm in stable condition. Will call to follow up with the patient this evening.

## 2022-07-18 NOTE — Telephone Encounter (Signed)
Called and spoke with the patient, she stated that she is doing ok, she stated that she got home and ate and is going to rest. She states that she is going to hold off on starting the Pepcid and Montelukast since she has a past history of reactions with medications and already had the issue with the xolair today. I advised that she can wait and speak with Dr. Delorse Lek about it at her upcoming appointment this Thursday. Patient verbalized understanding.

## 2022-07-18 NOTE — Telephone Encounter (Signed)
Patient made an appointment for 11am with Dr. Delorse Lek for blood work results and Xolair reaction follow up.

## 2022-07-18 NOTE — Progress Notes (Signed)
Immunotherapy   Patient Details  Name: Theresa Cruz MRN: [id number] Date of Birth: 11/08/1985  07/18/2022  Duard Brady started injections for  Xolair   Frequency: Every 2 Weeks  Epi-Pen: Yes  Consent signed and patient instructions given. Patient started Xolair today and received 375mg  dosage. She received 1 150mg  inj in the RUA and 1 75mg  inj in the RLA, and 1 inj of 150mg  in the LUA. Shortly after at 2:30 patient started complaining of weird feeling in throat. Vitals were obtained at 2:34 pm and were stable as BP 118/80, P 76, R 18, O2 97. She was complaining of difficulty swallowing and that it was not improved. Epinephrine was administered at 2:36 pm. She then complained of chest tightness and was given a Lev albuterol breathing treatment at 2:40 pm. Vitals obtained at 2:53 pm, BP 108/70, P 76, RR 18, O2 100. Vitals obtained at 3:00 pm, BP 128/88, P 82, RR 18, O2 98. Vitals at 3:14 pm BP 122/80, P 82, RR 18, O2 100, experiencing some fluttering in her chest and heart palpitations from the Epinephrine. Final vitals at 3:36 pm with BP 122/80, P 84, RR 18, O2 100 and symptom free. Patient discharged at 3:42pm in stable condition.   [name] 07/18/2022, 3:55 PM

## 2022-07-21 ENCOUNTER — Other Ambulatory Visit: Payer: Self-pay

## 2022-07-21 ENCOUNTER — Encounter: Payer: Self-pay | Admitting: Allergy

## 2022-07-21 ENCOUNTER — Ambulatory Visit (INDEPENDENT_AMBULATORY_CARE_PROVIDER_SITE_OTHER): Admitting: Allergy

## 2022-07-21 VITALS — BP 110/68 | HR 73 | Temp 97.9°F | Resp 17

## 2022-07-21 DIAGNOSIS — T50905D Adverse effect of unspecified drugs, medicaments and biological substances, subsequent encounter: Secondary | ICD-10-CM

## 2022-07-21 DIAGNOSIS — H1013 Acute atopic conjunctivitis, bilateral: Secondary | ICD-10-CM

## 2022-07-21 DIAGNOSIS — L509 Urticaria, unspecified: Secondary | ICD-10-CM

## 2022-07-21 DIAGNOSIS — J3089 Other allergic rhinitis: Secondary | ICD-10-CM

## 2022-07-21 DIAGNOSIS — J4520 Mild intermittent asthma, uncomplicated: Secondary | ICD-10-CM | POA: Diagnosis not present

## 2022-07-21 DIAGNOSIS — T7800XD Anaphylactic reaction due to unspecified food, subsequent encounter: Secondary | ICD-10-CM | POA: Diagnosis not present

## 2022-07-21 DIAGNOSIS — T781XXD Other adverse food reactions, not elsewhere classified, subsequent encounter: Secondary | ICD-10-CM

## 2022-07-21 DIAGNOSIS — Z888 Allergy status to other drugs, medicaments and biological substances status: Secondary | ICD-10-CM

## 2022-07-21 DIAGNOSIS — T7840XD Allergy, unspecified, subsequent encounter: Secondary | ICD-10-CM

## 2022-07-21 NOTE — Progress Notes (Signed)
Follow-up Note  RE: Theresa Cruz MRN: [id number] DOB: 1985-03-05 Date of Office Visit: 07/21/2022   History of present illness: Theresa Cruz is a 37 y.o. female presenting today for follow-up of reaction to Xolair.  She has history of allergic reaction secondary to multiple things including medications and foods.  She also has history of asthma and allergic rhinitis with conjunctivitis and urticaria.  She was last seen in the office on 06/15/2022 by myself for an office visit. She was in the office earlier this week for a Xolair Newstart.  We were starting Xolair in hopes that it would provide some coverage for her reactivity with her reactions.  Given she has a history of food allergy were able to start Xolair under the indication for food allergy management.  She received 375 mg of Xolair.  Shortly after the administration however the documentation she reported that her throat was feeling weird and that she felt she was having some difficulty swallowing.  She then complained of chest tightness.  She was given epinephrine as well as a little albuterol breathing treatment.  Symptoms improved and she was able to be discharged in good condition. Prior to this she did have a upper and lower endoscopy that per patient was normal.  However she did receive IV saline and she states she did receive some other medications during the procedure that had not been discussed prior.  She has now noted that on 3 occasions when she has received IV fluids via saline she has had reactions.  She states she had facial swelling that started and occurred the next day after her colonoscopy.  She showed me pictures today of her face that showed visible periorbital edema. She continues to take Xyzal twice a day.  She did stop pretty much all her other medications including cromolyn.  She did not start the Singulair or Pepcid after the last visit. She does have access to an epinephrine device. I did perform further  testing to see if she might have mastocytosis and these tests were essentially normal except for her urinary leukotrienes study that was elevated. 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  She already has been doing PT locally here and continues to follow with her specialist managing EDS and POTS.  She states she has an upcoming appointment with a EP cardiologist.  Review of systems: Review of Systems  Constitutional: Negative.   HENT:         See HPI  Eyes: Negative.   Respiratory:         See HPI  Cardiovascular: Negative.   Gastrointestinal: Negative.   Musculoskeletal: Negative.   Skin: Negative.   Allergic/Immunologic: Negative.   Neurological: Negative.      All other systems negative unless noted above in HPI  Past medical/social/surgical/family history have been reviewed and are unchanged unless specifically indicated below.  No changes  Medication List: Current Outpatient Medications  Medication Sig Dispense Refill   EPINEPHrine 0.3 mg/0.3 mL IJ SOAJ injection Inject 0.3 mg into the muscle as needed for anaphylaxis. 2 each 1   levalbuterol (XOPENEX HFA) 45 MCG/ACT inhaler Inhale 2 puffs every 6 hours as needed for wheezing, tightness in chest, or shortness of breath 1 each 1   levalbuterol (XOPENEX) 1.25 MG/3ML nebulizer solution Take 1.25 mg by nebulization every 6 (six) hours as needed for wheezing or shortness of  breath. 72 mL 1   levocetirizine (XYZAL) 5 MG tablet Take 1 tablet (5 mg total) by mouth in the morning and at bedtime. 60 tablet 5   buPROPion (WELLBUTRIN XL) 150 MG 24 hr tablet Take 150 mg by mouth daily. (Patient not taking: Reported on 07/21/2022)     clobetasol ointment (TEMOVATE) 0.05 % Apply 1 Application topically 2 (two) times daily. (Patient not taking: Reported on 07/21/2022)     cromolyn (GASTROCROM) 100 MG/5ML solution Take 5 mLs (100 mg total) by  mouth daily before breakfast. (Patient not taking: Reported on 07/21/2022) 600 mL 2   famotidine (PEPCID) 20 MG tablet Take 1 tablet (20 mg total) by mouth 2 (two) times daily. (Patient not taking: Reported on 07/21/2022) 60 tablet 5   fluticasone (FLONASE) 50 MCG/ACT nasal spray Place 2 sprays into both nostrils daily as needed for allergies or rhinitis. (Patient not taking: Reported on 07/21/2022) 16 g 5   hydrOXYzine (ATARAX) 10 MG tablet Take 10 mg by mouth at bedtime. As needed (Patient not taking: Reported on 07/21/2022)     Iron-FA-B Cmp-C-Biot-Probiotic (FUSION PLUS) CAPS Take 1 capsule by mouth daily. (Patient not taking: Reported on 07/21/2022) 30 capsule 6   Magnesium 200 MG TABS Take 200 mg by mouth daily. (Patient not taking: Reported on 07/21/2022)     metoprolol tartrate (LOPRESSOR) 25 MG tablet Take 0.5 tablets (12.5 mg total) by mouth 2 (two) times daily as needed (palpitations, tachycardia, hypertension). Please DO NOT take if your heart rate is <120 OR your blood pressure is below 120 on top (systolic) and/or below 70 on the bottom(diastolic) 60 tablet 0   montelukast (SINGULAIR) 10 MG tablet Take 1 tablet (10 mg total) by mouth at bedtime. (Patient not taking: Reported on 07/21/2022) 30 tablet 5   Olopatadine HCl (PATADAY) 0.2 % SOLN Place 1 drop into both eyes daily as needed. (Patient not taking: Reported on 07/21/2022) 2.5 mL 5   Olopatadine-Mometasone (RYALTRIS) 665-25 MCG/ACT SUSP 2 sprays each nostril twice a day as needed for runny or stuffy nose. (Patient not taking: Reported on 07/21/2022) 29 g 5   ondansetron (ZOFRAN-ODT) 4 MG disintegrating tablet Take 4 mg by mouth once. (Patient not taking: Reported on 07/21/2022)     prochlorperazine (COMPAZINE) 10 MG tablet Take 10 mg by mouth every 6 (six) hours as needed. (Patient not taking: Reported on 07/21/2022)     Rimegepant Sulfate (NURTEC) 75 MG TBDP Take 1 tablet by mouth as directed. Every other day (Patient not taking: Reported on  07/21/2022)     sertraline (ZOLOFT) 25 MG tablet Take 1 tablet (25 mg total) by mouth daily. 30 tablet 0   tranexamic acid (LYSTEDA) 650 MG TABS tablet Take 1,300 mg by mouth 3 (three) times daily. (Patient not taking: Reported on 07/21/2022)     Current Facility-Administered Medications  Medication Dose Route Frequency Provider Last Rate Last Admin   omalizumab Geoffry Paradise) prefilled syringe 375 mg  375 mg Subcutaneous Q14 Days Marcelyn Bruins, MD   375 mg at 07/18/22 1554     Known medication allergies: Allergies  Allergen Reactions   Bee Pollen Shortness Of Breath   Feraheme [Ferumoxytol] Diarrhea    N/V/D, abdominal pain and chest discomfort.    Ferric Carboxymaltose Anaphylaxis, Shortness Of Breath and Nausea And Vomiting   Fish Allergy Hives and Itching   Fish Oil Itching   Imitrex [Sumatriptan] Other (See Comments)    Muscle tension   Other Hives    Tree  nuts   Peach Flavor Hives and Shortness Of Breath   Peanut Oil Hives and Itching   Peanut-Containing Drug Products Hives   Pollen Extract Shortness Of Breath   Rizatriptan Other (See Comments)    Muscles tightening   Shellfish-Derived Products Anaphylaxis   Benadryl Allergy [Diphenhydramine Hcl] Nausea And Vomiting   Cocoa Other (See Comments)    Migraine   Gluten Meal Nausea And Vomiting and Other (See Comments)    Migraine   Milk (Cow) Nausea And Vomiting   Topiramate Other (See Comments)    Other Reaction(s): Confusion or altered mental state  Other Reaction(s): Delusions (intolerance)    Muscles tightening   Eletriptan Other (See Comments)   Eletriptan Hydrobromide Other (See Comments)   Tape Rash     Physical examination: Blood pressure 110/68, pulse 73, temperature 97.9 F (36.6 C), temperature source Temporal, resp. rate 17, SpO2 100 %.  General: Alert, interactive, in no acute distress. HEENT: PERRLA, TMs pearly gray, turbinates minimally edematous without discharge, post-pharynx non  erythematous. Neck: Supple without lymphadenopathy. Lungs: Clear to auscultation without wheezing, rhonchi or rales. {no increased work of breathing. CV: Normal S1, S2 without murmurs. Abdomen: Nondistended, nontender. Skin: Warm and dry, without lesions or rashes. Extremities:  No clubbing, cyanosis or edema. Neuro:   Grossly intact.  Diagnositics/Labs: Labs:  06/24/2022  CKIT Result Negative    Component     Latest Ref Rng 06/24/2022  Prostaglandin D2, serum     pg/mL 7.0     Component     Latest Ref Rng 06/28/2022  N-Methylhistamine, 24 Hr, U     30 - 200 mcg/g Cr 96   Creatinine, 24 Hour, U     603 - 1,783 mg/24 h 1,109 (C)  Collection Duration     h 24 (C)  Collection Duration     h 24 (C)  Urine Volume     mL 1,290 (C)  Urine Volume     mL 1,200 (C)  Creatinine Concent. 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  Tryptase obtained during episodes has also been normal however timing of draw is in question.  This would argue against a frank mastocytosis diagnosis.  Urine histamine studies are normal Urine luekotriene studies are elevated --> this could be a large contributor to symptoms   - To simplify current medication regimen we discussed taking only the following: -Levocetrizine at twice a day  -Singulair at bedtime -- this will decrease her luekotriene load that she obviously has a lot of per urine study.  This is an antileukotriene used commonly for allergy and asthma  symptom control.  If you notice any change in mood/behavior/sleep after starting Singulair then stop this medication and let me know.  Symptoms resolve after stopping the medication.   Holding on Cromolyn and Pepcid use at this time  -Have access to epinephrine device in case of reaction.  Follow emergency action plan in case of  allergic reaction - Xolair 375mg  injection tried however developed adverse symptoms and treated with epinephrine.  Discussed there is option to try again with reduced dose (150mg ) but would only do this if tolerating Singulair.   -Continue your current food avoidance -Food allergy testing has been positive to sesame, milk, pistachio, hops, pork, navy bean, Karaya gum, hazelnut, pecan, cashew, walnut, peach, kiwi, pineapple, banana with previous testing.  All of these levels were very low except for the cashew.  -I do believe you have a component of oral allergy syndrome with fresh or raw fruits related to your pollen allergy (see below) -There could be an irritant effect (like strong odors or noxious odors) can irritate the airway leading to symptoms there could be a role here of airborne food particles that may be inhaled irritating the airway -she has had symptoms including angioedema now 3 times after receiving IV saline  Asthma -Have access to Xopenex inhaler 2 puffs or Xopenex 1 vial via nebulizer every 4-6 hours as needed for cough/wheeze/shortness of breath/chest tightness.  May use 15-20 minutes prior to activity.   Monitor frequency of use.    Allergic rhinitis -Continue avoidance measures for dust mite, dog dander, grass pollen, tree pollen, weed pollen, ragweed pollen, and mold.  -Continue Xyzal -Use Ryaltris 2 sprays each nostril twice a day as needed for runny or stuffy nose.  This is a combination spray with mometasone (nasal steroid for congestion control) and olopatadine (nasal antihistamine for drainage control).  This goes to a specialty pharmacy.  Sample  provided today.  -Consider saline nasal rinses as needed for nasal symptoms. Use this before any medicated nasal sprays for best result  Allergic conjunctivitis -Use Pataday one drop in each eye once a day as needed for red, itchy eyes  Urticaria (hives) - use Antihistamines as above - you can use Hydroxyzine when needed for hive control.  This medication is usually sedating.     Follow up in  2 months or sooner if needed.  Will on singulair will plan to repeat urine study  I appreciate the opportunity to take part in Princessa's care. Please do not hesitate to contact me with questions.  Sincerely,   Margo Aye, MD Allergy/Immunology [facility]

## 2022-07-21 NOTE — Patient Instructions (Addendum)
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  Tryptase obtained during episodes has also been normal however timing of draw is in question.  This would argue against a frank mastocytosis diagnosis.  Urine histamine studies are normal Urine luekotriene studies are elevated --> this could be a large contributor to symptoms   - To simplify current medication regimen we discussed taking only the following: -Levocetrizine at twice a day  -Singulair at bedtime -- this will decrease her luekotriene load that she obviously has a lot of per urine study.  This is an antileukotriene used commonly for allergy and asthma symptom control.  If you notice any change in mood/behavior/sleep after starting Singulair then stop this medication and let me know.  Symptoms resolve after stopping the medication.   Holding on Cromolyn and Pepcid use at this time  -Have access to epinephrine device in case of reaction.  Follow emergency action plan in case of allergic reaction - Xolair 375mg  injection tried however developed adverse symptoms and treated with epinephrine.  Discussed there is option to try again with reduced dose (150mg ) but would only do this if tolerating Singulair.   -Continue your current food avoidance -Food allergy testing has been positive to sesame, milk, pistachio, hops, pork, navy bean, Karaya gum, hazelnut, pecan, cashew, walnut, peach, kiwi, pineapple, banana with previous testing.  All of these levels were very low except for the cashew.  -I do believe you have a component of oral allergy syndrome with fresh or raw fruits related to your pollen allergy (see below) -There could be an irritant effect (like strong  odors or noxious odors) can irritate the airway leading to symptoms there could be a role here of airborne food particles that may be inhaled irritating the airway -she has had symptoms including angioedema now 3 times after receiving IV saline  Asthma -Have access to Xopenex inhaler 2 puffs or Xopenex 1 vial via nebulizer every 4-6 hours as needed for cough/wheeze/shortness of breath/chest tightness.  May use 15-20 minutes prior to activity.   Monitor frequency of use.    Allergic rhinitis -Continue avoidance measures for dust mite, dog dander, grass pollen, tree pollen, weed pollen, ragweed pollen, and mold.  -Continue Xyzal -Use Ryaltris 2 sprays each nostril twice a day as needed for runny or stuffy nose.  This is a combination spray with mometasone (nasal steroid for congestion control) and olopatadine (nasal antihistamine for drainage control).  This goes to a specialty pharmacy.  Sample provided today.  -Consider saline nasal rinses as needed for nasal symptoms. Use this before any medicated nasal sprays for best result  Allergic conjunctivitis -Use Pataday one drop in each eye once a day as needed for red, itchy eyes  Urticaria (hives) - use Antihistamines as above - you can use Hydroxyzine when needed for hive control.  This medication is usually sedating.     Follow up in  2 months or sooner if needed.  Will on singulair will plan to repeat urine study

## 2022-07-25 ENCOUNTER — Encounter: Payer: Self-pay | Admitting: Allergy

## 2022-08-04 ENCOUNTER — Ambulatory Visit

## 2022-08-05 ENCOUNTER — Encounter: Payer: Self-pay | Admitting: Allergy

## 2022-08-31 ENCOUNTER — Telehealth (HOSPITAL_COMMUNITY): Payer: Self-pay

## 2022-08-31 NOTE — Telephone Encounter (Signed)
Received outside referral for cardiac rehab from [place]. Left message for pt to return phone call to see if she is interested.

## 2022-09-08 ENCOUNTER — Encounter: Payer: Self-pay | Admitting: Internal Medicine

## 2022-09-08 ENCOUNTER — Ambulatory Visit: Attending: Internal Medicine | Admitting: Internal Medicine

## 2022-09-08 VITALS — BP 132/90 | HR 73 | Ht 63.0 in | Wt 120.6 lb

## 2022-09-08 DIAGNOSIS — R002 Palpitations: Secondary | ICD-10-CM

## 2022-09-08 NOTE — Patient Instructions (Signed)
Medication Instructions:  The current medical regimen is effective;  continue present plan and medications.  *If you need a refill on your cardiac medications before your next appointment, please call your pharmacy*    Follow-Up: At [facility], you and your health needs are our priority.  As part of our continuing mission to provide you with exceptional heart care, we have created designated Provider Care Teams.  These Care Teams include your primary Cardiologist (physician) and Advanced Practice Providers (APPs -  Physician Assistants and Nurse Practitioners) who all work together to provide you with the care you need, when you need it.  We recommend signing up for the patient portal called "MyChart".  Sign up information is provided on this After Visit Summary.  MyChart is used to connect with patients for Virtual Visits (Telemedicine).  Patients are able to view lab/test results, encounter notes, upcoming appointments, etc.  Non-urgent messages can be sent to your provider as well.   To learn more about what you can do with MyChart, go to [email or web address].    Your next appointment:   4 month(s)  Provider:   Sherryl Manges, MD

## 2022-09-08 NOTE — Progress Notes (Signed)
ELECTROPHYSIOLOGY CONSULT NOTE  Patient ID: Theresa Cruz, MRN: [id number], DOB/AGE: [birth year] 37 y.o. Admit date: (Not on file) Date of Consult: 09/08/2022  Primary Physician: Cyril Mourning, FNP Primary Cardiologist: none     Theresa Cruz is a 37 y.o. female who is being seen today for the evaluation of POTS   at the request of RJ.    HPI Theresa Cruz is a 37 y.o. female referred with a diagnosis of POTS who has a story and a history that is almost too complicated to write down.  She reports that about 20 years ago she started having palpitations sometimes they felt like skipped some time they felt like her heart dropping she took a red bull and her heart raced.  Few years after that she started complaining of migraines and developed food allergies and had multiple food allergies. 5 years ago she joined the [employer] in 1 day she found she could not lift her legs and she had sharp pains in her legs and paresthesias her legs will get hot and cold.  She was found to have elevated CK she was treated with fluids but she lost the ability to run, this is come back and then she has lost it again.  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  In her immunological workup she was found to have leukotriene levels that were elevated in her urine.  She struggles with anxiety obsessive-compulsive disorder depression and ADHD.  She has had 2 episodes of COVID 2021 and 2023  She has variable orthostatic intolerance and exertional tachycardia.  Her diet is relatively deplete of sodium and fluid because "I cannot remember to take it "similarly, she does not take her psychotropic medications regularly either.  She has seen over the last years at 63 different doctors in 20 different specialties.  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 Counseled on avoidance. She will bring benadryl and Epipen to [place]. She has allergy alert cards written in mandarin to bring. Formatting of this note might be different from the original. Last Assessment & Plan: Formatting of this note might be different from the original. Counseled on avoidance. She will bring    Urticaria       Surgical History:  Past Surgical History:  Procedure Laterality Date   NO PAST SURGERIES       Home Meds: Current Meds  Medication Sig   clobetasol ointment (TEMOVATE) 0.05 % Apply 1 Application topically as needed.   EPINEPHrine 0.3 mg/0.3 mL IJ SOAJ injection Inject 0.3 mg into the muscle  as needed for anaphylaxis.   famotidine (PEPCID) 20 MG tablet Take 1 tablet (20 mg total) by mouth 2 (two) times daily. (Patient taking differently: Take 20 mg by mouth as needed.)   levalbuterol (XOPENEX HFA) 45 MCG/ACT inhaler Inhale 2 puffs every 6 hours as needed for wheezing, tightness in chest, or shortness of breath   levalbuterol (XOPENEX) 1.25 MG/3ML nebulizer solution Take 1.25 mg by nebulization every 6 (six) hours as needed for wheezing or shortness of breath.   levocetirizine (XYZAL) 5 MG tablet Take 1 tablet (5 mg total) by mouth in the morning and at bedtime.   metoprolol tartrate (LOPRESSOR) 25 MG tablet Take  0.5 tablets (12.5 mg total) by mouth 2 (two) times daily as needed (palpitations, tachycardia, hypertension). Please DO NOT take if your heart rate is <120 OR your blood pressure is below 120 on top (systolic) and/or below 70 on the bottom(diastolic)   Olopatadine HCl (PATADAY) 0.2 % SOLN Place 1 drop into both eyes daily as needed.   Olopatadine-Mometasone (RYALTRIS) [id number] MCG/ACT SUSP 2 sprays each nostril twice a day as needed for runny or stuffy nose.   ondansetron (ZOFRAN-ODT) 4 MG disintegrating tablet Take 4 mg by mouth once.   Rimegepant Sulfate (NURTEC) 75 MG TBDP Take 1 tablet by mouth as needed.   Current Facility-Administered Medications for the 09/08/22 encounter (Office Visit) with Duke Salvia, MD  Medication   omalizumab Geoffry Paradise) prefilled syringe 375 mg    Allergies:  Allergies  Allergen Reactions   Bee Pollen Shortness Of Breath   Feraheme [Ferumoxytol] Diarrhea    N/V/D, abdominal pain and chest discomfort.    Ferric Carboxymaltose Anaphylaxis, Shortness Of Breath and Nausea And Vomiting   Fish Allergy Hives and Itching   Fish Oil Itching   Imitrex [Sumatriptan] Other (See Comments)    Muscle tension   Other Hives    Tree nuts   Peach Flavor Hives and Shortness Of Breath   Peanut Oil Hives and Itching   Peanut-Containing Drug Products Hives   Pollen Extract Shortness Of Breath   Rizatriptan Other (See Comments)    Muscles tightening   Shellfish-Derived Products Anaphylaxis   Benadryl Allergy [Diphenhydramine Hcl] Nausea And Vomiting   Cocoa Other (See Comments)    Migraine   Gluten Meal Nausea And Vomiting and Other (See Comments)    Migraine   Milk (Cow) Nausea And Vomiting   Topiramate Other (See Comments)    Other Reaction(s): Confusion or altered mental state  Other Reaction(s): Delusions (intolerance)    Muscles tightening   Eletriptan Other (See Comments)   Eletriptan Hydrobromide Other (See Comments)   Tape Rash    Social History    Socioeconomic History   Marital status: Single    Spouse name: Not on file   Number of children: Not on file   Years of education: Not on file   Highest education level: Not on file  Occupational History   Not on file  Tobacco Use   Smoking status: Never   Smokeless tobacco: Never  Vaping Use   Vaping status: Never Used  Substance and Sexual Activity   Alcohol use: Not Currently   Drug use: Never   Sexual activity: Not on file  Other Topics Concern   Not on file  Social History Narrative   Not on file   Social Determinants of Health   Financial Resource Strain: Low Risk  (04/25/2022)   Received from [facility], [facility]   Overall Financial Resource  Strain (CARDIA)    Difficulty of Paying Living Expenses: Not very hard  Food Insecurity: No Food Insecurity (06/11/2022)   Hunger Vital Sign    Worried About Running Out of Food in the Last Year: Never true    Ran Out of Food in the Last Year: Never true  Transportation Needs: No Transportation Needs (06/11/2022)   PRAPARE - Administrator, Civil Service (Medical): No    Lack of Transportation (Non-Medical): No  Physical Activity: Insufficiently Active (04/25/2022)   Received from [facility], [facility]   Exercise Vital Sign    Days of Exercise per Week: 1 day    Minutes of Exercise per Session: 10 min  Stress: No Stress Concern Present (07/08/2022)   Received from [facility], [facility] of Occupational Health - Occupational Stress Questionnaire    Feeling of Stress : Not at all  Recent Concern: Stress - Stress Concern Present (04/25/2022)   Received from [facility] of Occupational Health - Occupational Stress Questionnaire    Feeling of Stress : Rather much  Social Connections: Moderately Integrated (04/25/2022)   Received from [facility], [facility]   Social Network    How would you rate your social network (family, work, friends)?: Adequate  participation with social networks  Intimate Partner Violence: Not At Risk (07/08/2022)   Received from [facility], [facility]   HITS    Over the last 12 months how often did your partner physically hurt you?: 1    Over the last 12 months how often did your partner insult you or talk down to you?: 1    Over the last 12 months how often did your partner threaten you with physical harm?: 1    Over the last 12 months how often did your partner scream or curse at you?: 1     Family History  Problem Relation Age of Onset   Fibroids Mother    Brain cancer Father    Hypertension Maternal Grandmother    Stroke Maternal Grandmother    Thyroid disease Maternal Grandmother    Cancer Maternal Grandfather    Alzheimer's disease Maternal Grandfather    Prostate cancer Maternal Grandfather    Alzheimer's disease Paternal Grandmother    Allergic rhinitis Neg Hx    Angioedema Neg Hx    Asthma Neg Hx    Eczema Neg Hx    Immunodeficiency Neg Hx    Urticaria Neg Hx      ROS:  Please see the history of present illness.     All other systems reviewed and negative.    Physical Exam: Blood pressure (!) 132/90, pulse 73, height 5\' 3"  (1.6 m), weight 120 lb 9.6 oz (54.7 kg), last menstrual period 08/20/2022, SpO2 98%. General: Well developed, well nourished female in no acute distress. Head: Normocephalic, atraumatic, sclera non-icteric, no xanthomas, nares are without discharge. EENT: normal  Lymph Nodes:  none Neck: Negative for carotid bruits. JVD not elevated. Back:without scoliosis kyphosis Lungs: Clear bilaterally to auscultation without wheezes, rales, or rhonchi. Breathing is unlabored. Heart: RRR with S1 S2. No /6 systolic murmur . No rubs, or gallops appreciated. Abdomen: Soft, non-tender, non-distended with normoactive bowel sounds. No hepatomegaly. No rebound/guarding. No obvious abdominal masses. Msk:  Strength and tone appear normal for age. Extremities: No clubbing or cyanosis.  No  edema.  Distal pedal pulses are 2+ and equal bilaterally. Skin: Warm and Dry Neuro: Alert and oriented X  3. CN III-XII intact Grossly normal sensory and motor function . Psych:  Responds to questions appropriately with a normal affect.        EKG: Sinus at 72 Intervals 13/08/39   Assessment and Plan:  Palpitations  Orthostatic intolerance  Question mast cell activation disorder  Recurrent anaphylaxis  Multiple allergies  Migraines  Neuro cognitive issues including OCD, ADHD, visual hallucinations,  EDS/joint hypermobility disorder  COVID-recurrent   The patient has a multitude of complaints and I am not able to craft them in a unifying way.  To the degree that there is an autonomic component as suggested by heat intolerance and standing intolerance, encouraged her to increase her salt and water intake and to consider compressive wear.  Her immunological issues are sometimes associated with autonomic dysfunction but may also be independent.  She has this unusual finding of excess leukotrienes in the urine which her immunologist says is quite striking. Multiple allergies or recurrent anaphylaxis also suggest an immunological issue   Neurological complaints of difficulty walking unable to lift the legs pains and paresthesias in her legs with apparently negative neurological evaluation is beyond my understanding, perhaps it could have been related to hypotension.  I suggested that she talk to her immunologist about a referral to a center like [facility] where perhaps a very broad based approach might be able to come up with a unifying diagnosis.  Also suggested that she continue to seek seek emotional support as she traverses with chronic illness.       Sherryl Manges

## 2022-09-13 ENCOUNTER — Encounter: Payer: Self-pay | Admitting: *Deleted

## 2022-09-13 ENCOUNTER — Ambulatory Visit
Admission: EM | Admit: 2022-09-13 | Discharge: 2022-09-13 | Disposition: A | Discharge status: Home / Self Care | Attending: Internal Medicine | Admitting: Internal Medicine

## 2022-09-13 ENCOUNTER — Other Ambulatory Visit: Payer: Self-pay

## 2022-09-13 DIAGNOSIS — J029 Acute pharyngitis, unspecified: Secondary | ICD-10-CM | POA: Diagnosis not present

## 2022-09-13 DIAGNOSIS — J069 Acute upper respiratory infection, unspecified: Secondary | ICD-10-CM | POA: Diagnosis not present

## 2022-09-13 DIAGNOSIS — Z20822 Contact with and (suspected) exposure to covid-19: Secondary | ICD-10-CM

## 2022-09-13 LAB — POCT RAPID STREP A (OFFICE): Rapid Strep A Screen: NEGATIVE

## 2022-09-13 LAB — POCT INFLUENZA A/B
Influenza A, POC: NEGATIVE
Influenza B, POC: NEGATIVE

## 2022-09-13 NOTE — ED Provider Notes (Signed)
[facility]    CSN: [id number] Arrival date & time: 09/13/22  1900      History   Chief Complaint Chief Complaint  Patient presents with   Sore Throat    HPI Theresa Cruz is a 37 y.o. female.   Patient presents with sore throat, headache, chills, nasal congestion that started over the past 24 hours.  Reports COVID exposure.  Took 2 COVID tests at home that were negative.  Denies chest pain, shortness of breath, gastrointestinal symptoms.  Denies any fever.   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 Counseled on avoidance. She will bring benadryl and Epipen to [place]. She has allergy alert cards written in mandarin to bring. Formatting of this note might be different from the original. Last Assessment & Plan: Formatting of this note might be different from the original. Counseled on avoidance. 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   Palpitations 12/28/2021   Dyspnea on exertion 12/28/2021   GAD (generalized  anxiety disorder) 12/08/2021   Obsessive compulsive disorder 12/08/2021   IDA (iron deficiency anemia) 10/04/2021   Ehlers-Danlos syndrome type III 09/30/2021   Absolute anemia 09/30/2021   Ephelides 07/08/2021   Inflamed seborrheic keratosis 07/08/2021   Iron deficiency 04/29/2021   Platelets decreased (HCC) 12/14/2020   Anxiety 11/10/2020   Elevated low density lipoprotein (LDL) cholesterol level 09/13/2019   Mild intermittent asthma without complication 04/19/2019   Uterine leiomyoma 08/29/2018   Migraines 04/14/2018   Numbness and tingling of lower extremity 04/14/2018   Lumbar facet joint pain 04/12/2018   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      Home Medications    Prior to Admission medications   Medication Sig Start Date End Date Taking? Authorizing Provider  levocetirizine (XYZAL) 5 MG tablet Take 1 tablet (5 mg total) by mouth in the morning and at bedtime. 06/15/22  Yes Padgett, Pilar Grammes, MD  Rimegepant Sulfate (NURTEC) 75 MG TBDP Take 1 tablet by mouth as needed. 01/12/22  Yes [provider]  buPROPion (WELLBUTRIN XL) 150 MG 24 hr tablet Take 150 mg by mouth daily. Patient not taking: Reported on 07/21/2022 05/23/22   [provider]  clobetasol ointment (TEMOVATE) 0.05 % Apply 1 Application topically as needed. 05/23/22   [provider]  cromolyn (GASTROCROM) 100 MG/5ML solution Take 5 mLs (100 mg total) by mouth daily before breakfast. Patient not taking: Reported on 07/21/2022 04/06/22   Marcelyn Bruins, MD  EPINEPHrine 0.3 mg/0.3 mL IJ SOAJ injection Inject 0.3 mg  into the muscle as needed for anaphylaxis. 06/11/22   Peter Garter, PA  famotidine (PEPCID) 20 MG tablet Take 1 tablet (20 mg total) by mouth 2 (two) times daily. Patient taking differently: Take 20 mg by mouth as needed. 07/13/22   Marcelyn Bruins, MD  fluticasone (FLONASE) 50 MCG/ACT nasal spray Place 2 sprays into both nostrils daily as needed for allergies or rhinitis. Patient not taking: Reported on 07/21/2022 11/04/20   Marcelyn Bruins, MD  hydrOXYzine (ATARAX) 10 MG tablet Take 10 mg by mouth at bedtime. As needed Patient not taking: Reported on 07/21/2022 03/03/22   [provider]  Iron-FA-B Cmp-C-Biot-Probiotic (FUSION PLUS) CAPS Take 1 capsule by mouth daily. Patient not taking: Reported on 07/21/2022 10/08/21   Erenest Blank, NP  levalbuterol [facility] HFA) 45 MCG/ACT inhaler Inhale 2 puffs every 6 hours as needed for wheezing, tightness in chest, or shortness of breath 06/13/22   Nehemiah Settle, FNP  levalbuterol Pauline Aus) 1.25 MG/3ML nebulizer solution Take 1.25 mg by nebulization every 6 (six) hours as needed for wheezing or shortness of breath. 06/15/22   Marcelyn Bruins, MD  Magnesium 200 MG TABS Take 200 mg by mouth daily. Patient not taking: Reported on 07/21/2022    [provider]  metoprolol tartrate (LOPRESSOR) 25 MG tablet Take 0.5 tablets (12.5 mg total) by mouth 2 (two) times daily as needed (palpitations, tachycardia, hypertension). Please DO NOT take if your heart rate is <120 OR your blood pressure is below 120 on top (systolic) and/or below 70 on the bottom(diastolic) 06/14/22 09/08/22  Bradler, Clent Jacks, MD  montelukast (SINGULAIR) 10 MG tablet Take 1 tablet (10 mg total) by mouth at bedtime. Patient not taking: Reported on 07/21/2022 07/13/22   Marcelyn Bruins, MD  Olopatadine HCl (PATADAY) 0.2 % SOLN Place 1 drop into both eyes daily as needed. 04/06/22   Marcelyn Bruins, MD  Olopatadine-Mometasone Cristal Generous)  [phone] MCG/ACT SUSP 2 sprays each nostril twice a day as needed for runny or stuffy nose. 04/06/22   Marcelyn Bruins, MD  ondansetron (ZOFRAN-ODT) 4 MG disintegrating tablet Take 4 mg by mouth once.    [provider]  prochlorperazine (COMPAZINE) 10 MG tablet Take 10 mg by mouth every 6 (six) hours as needed. Patient not taking: Reported on 07/21/2022    [provider]  sertraline (ZOLOFT) 25 MG tablet Take 1 tablet (25 mg total) by mouth daily. Patient not taking: Reported on 09/08/2022 06/12/22 07/12/22  Lorin Glass, MD  tranexamic acid (LYSTEDA) 650 MG TABS tablet Take 1,300 mg by mouth 3 (three) times daily. Patient not taking: Reported on 07/21/2022 05/23/22   [provider]    Family History Family History  Problem Relation Age of Onset   Fibroids Mother    Brain cancer Father    Hypertension Maternal Grandmother    Stroke Maternal Grandmother    Thyroid disease Maternal Grandmother    Cancer Maternal Grandfather    Alzheimer's disease Maternal Grandfather    Prostate cancer Maternal Grandfather    Alzheimer's disease Paternal Grandmother    Allergic rhinitis Neg Hx    Angioedema Neg Hx  Asthma Neg Hx    Eczema Neg Hx    Immunodeficiency Neg Hx    Urticaria Neg Hx     Social History Social History   Tobacco Use   Smoking status: Never   Smokeless tobacco: Never  Vaping Use   Vaping status: Never Used  Substance Use Topics   Alcohol use: Not Currently   Drug use: Never     Allergies   Bee pollen, Feraheme [ferumoxytol], Ferric carboxymaltose, Fish allergy, Fish oil, Imitrex [sumatriptan], Other, Peach flavor, Peanut oil, Peanut-containing drug products, Pollen extract, Rizatriptan, Shellfish-derived products, Benadryl allergy [diphenhydramine hcl], Cocoa, Gluten meal, Milk (cow), Topiramate, Eletriptan, Eletriptan hydrobromide, and Tape   Review of Systems Review of Systems Per HPI  Physical Exam Triage Vital Signs ED  Triage Vitals [09/13/22 1935]  Encounter Vitals Group     BP 131/85     Systolic BP Percentile      Diastolic BP Percentile      Pulse Rate 75     Resp 18     Temp 98.7 F (37.1 C)     Temp Source Oral     SpO2 99 %     Weight      Height      Head Circumference      Peak Flow      Pain Score      Pain Loc      Pain Education      Exclude from Growth Chart    No data found.  Updated Vital Signs BP 131/85 (BP Location: Left Arm)   Pulse 75   Temp 98.7 F (37.1 C) (Oral)   Resp 18   LMP 08/20/2022   SpO2 99%   Visual Acuity Right Eye Distance:   Left Eye Distance:   Bilateral Distance:    Right Eye Near:   Left Eye Near:    Bilateral Near:     Physical Exam Constitutional:      General: She is not in acute distress.    Appearance: Normal appearance. She is not toxic-appearing or diaphoretic.  HENT:     Head: Normocephalic and atraumatic.     Right Ear: Tympanic membrane and ear canal normal.     Left Ear: Tympanic membrane and ear canal normal.     Nose: Congestion present.     Mouth/Throat:     Mouth: Mucous membranes are moist.     Pharynx: Posterior oropharyngeal erythema present.  Eyes:     Extraocular Movements: Extraocular movements intact.     Conjunctiva/sclera: Conjunctivae normal.     Pupils: Pupils are equal, round, and reactive to light.  Cardiovascular:     Rate and Rhythm: Normal rate and regular rhythm.     Pulses: Normal pulses.     Heart sounds: Normal heart sounds.  Pulmonary:     Effort: Pulmonary effort is normal. No respiratory distress.     Breath sounds: Normal breath sounds. No stridor. No wheezing, rhonchi or rales.  Abdominal:     General: Abdomen is flat. Bowel sounds are normal.     Palpations: Abdomen is soft.  Musculoskeletal:        General: Normal range of motion.     Cervical back: Normal range of motion.  Skin:    General: Skin is warm and dry.  Neurological:     General: No focal deficit present.     Mental  Status: She is alert and oriented to person, place, and time. Mental status is at  baseline.  Psychiatric:        Mood and Affect: Mood normal.        Behavior: Behavior normal.      UC Treatments / Results  Labs (all labs ordered are listed, but only abnormal results are displayed) Labs Reviewed  SARS CORONAVIRUS 2 (TAT 6-24 HRS)  CULTURE, GROUP A STREP [facility])  POCT RAPID STREP A (OFFICE)  POCT INFLUENZA A/B    EKG   Radiology No results found.  Procedures Procedures (including critical care time)  Medications Ordered in UC Medications - No data to display  Initial Impression / Assessment and Plan / UC Course  I have reviewed the triage vital signs and the nursing notes.  Pertinent labs & imaging results that were available during my care of the patient were reviewed by me and considered in my medical decision making (see chart for details).     Patient presents with symptoms likely from a viral upper respiratory infection.  Do not suspect underlying cardiopulmonary process. Symptoms seem unlikely related to ACS, CHF or COPD exacerbations, pneumonia, pneumothorax. Patient is nontoxic appearing and not in need of emergent medical intervention.  Rapid strep and rapid flu negative.  Throat culture and COVID test pending.  Recommended symptom control with over the counter medications.  Advised adequate fluid hydration and rest.  Return if symptoms fail to improve in 1-2 weeks or you develop shortness of breath, chest pain, severe headache. Patient states understanding and is agreeable.  Discharged with PCP followup.  Final Clinical Impressions(s) / UC Diagnoses   Final diagnoses:  Close exposure to COVID-19 virus  Viral upper respiratory tract infection with cough  Sore throat     Discharge Instructions      Rapid flu and rapid strep are negative.  Throat culture and COVID test pending.  Suspect viral illness as cause of symptoms as we discussed.  Advised adequate  fluid hydration and rest.  Follow-up if any symptoms persist or worsen.     ED Prescriptions   None    PDMP not reviewed this encounter.   Gustavus Bryant, [place] 09/13/22 2006

## 2022-09-13 NOTE — ED Triage Notes (Signed)
Pt reports sore throat, chills, congestion, headache x 1 day. Home covid test neg today

## 2022-09-13 NOTE — Discharge Instructions (Addendum)
Rapid flu and rapid strep are negative.  Throat culture and COVID test pending.  Suspect viral illness as cause of symptoms as we discussed.  Advised adequate fluid hydration and rest.  Follow-up if any symptoms persist or worsen.

## 2022-09-15 LAB — SARS CORONAVIRUS 2 (TAT 6-24 HRS): SARS Coronavirus 2: NEGATIVE

## 2022-09-17 LAB — CULTURE, GROUP A STREP (THRC)

## 2022-09-19 ENCOUNTER — Encounter: Payer: Self-pay | Admitting: Internal Medicine

## 2022-09-20 ENCOUNTER — Ambulatory Visit (HOSPITAL_COMMUNITY)
Admission: RE | Admit: 2022-09-20 | Discharge: 2022-09-20 | Disposition: A | Discharge status: Home / Self Care | Source: Ambulatory Visit | Attending: Family Medicine | Admitting: Family Medicine

## 2022-09-20 ENCOUNTER — Encounter: Payer: Self-pay | Admitting: Allergy

## 2022-09-20 ENCOUNTER — Encounter (HOSPITAL_COMMUNITY): Payer: Self-pay

## 2022-09-20 VITALS — BP 131/87 | HR 81 | Temp 98.3°F | Resp 18

## 2022-09-20 DIAGNOSIS — J019 Acute sinusitis, unspecified: Secondary | ICD-10-CM | POA: Diagnosis not present

## 2022-09-20 MED ORDER — CEFDINIR 300 MG PO CAPS: 600.0000 mg | ORAL_CAPSULE | Freq: Every day | ORAL | 0 refills | Status: DC

## 2022-09-20 MED ORDER — CEFDINIR 300 MG PO CAPS: 600.0000 mg | ORAL_CAPSULE | Freq: Every day | ORAL | 0 refills | Status: AC

## 2022-09-20 MED ORDER — BENZONATATE 100 MG PO CAPS: 100.0000 mg | ORAL_CAPSULE | Freq: Three times a day (TID) | ORAL | 0 refills | Status: DC | PRN

## 2022-09-20 NOTE — ED Provider Notes (Signed)
[facility]    CSN: [id number] Arrival date & time: 09/20/22  1004      History   Chief Complaint Chief Complaint  Patient presents with   Nasal Congestion    HPI Theresa Cruz is a 37 y.o. female.   HPI Here for 9-10 days of congestion, chills, cough. She is now hoarse, and now has some mild sinus pressure.  Testing was negative on 8/20 for flu and COVID.  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 Counseled on avoidance. She will bring benadryl and Epipen to [place]. She has allergy alert cards written in mandarin to bring. Formatting of this note might be different from the original. Last Assessment & Plan: Formatting of this note might be different from the original. Counseled on avoidance. 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   Palpitations 12/28/2021   Dyspnea on exertion 12/28/2021   GAD (generalized anxiety disorder) 12/08/2021   Obsessive compulsive disorder 12/08/2021   IDA  (iron deficiency anemia) 10/04/2021   Ehlers-Danlos syndrome type III 09/30/2021   Absolute anemia 09/30/2021   Ephelides 07/08/2021   Inflamed seborrheic keratosis 07/08/2021   Iron deficiency 04/29/2021   Platelets decreased (HCC) 12/14/2020   Anxiety 11/10/2020   Elevated low density lipoprotein (LDL) cholesterol level 09/13/2019   Mild intermittent asthma without complication 04/19/2019   Uterine leiomyoma 08/29/2018   Migraines 04/14/2018   Numbness and tingling of lower extremity 04/14/2018   Lumbar facet joint pain 04/12/2018   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      Home Medications    Prior to Admission medications   Medication Sig Start Date End Date Taking? Authorizing Provider  benzonatate (TESSALON) 100 MG capsule Take 1 capsule (100 mg total) by mouth 3 (three) times daily as needed for cough. 09/20/22  Yes Zenia Resides, MD  cefdinir (OMNICEF) 300 MG capsule Take 2 capsules (600 mg total) by mouth daily for 7 days. 09/20/22 09/27/22 Yes Zenia Resides, MD  buPROPion (WELLBUTRIN XL) 150 MG 24 hr tablet Take 150 mg by mouth daily. Patient not taking: Reported on 07/21/2022 05/23/22   [provider]  clobetasol ointment (TEMOVATE) 0.05 %  Apply 1 Application topically as needed. 05/23/22   [provider]  cromolyn (GASTROCROM) 100 MG/5ML solution Take 5 mLs (100 mg total) by mouth daily before breakfast. Patient not taking: Reported on 07/21/2022 04/06/22   Marcelyn Bruins, MD  EPINEPHrine 0.3 mg/0.3 mL IJ SOAJ injection Inject 0.3 mg into the muscle as needed for anaphylaxis.  06/11/22   Peter Garter, PA  famotidine (PEPCID) 20 MG tablet Take 1 tablet (20 mg total) by mouth 2 (two) times daily. Patient taking differently: Take 20 mg by mouth as needed. 07/13/22   Marcelyn Bruins, MD  fluticasone (FLONASE) 50 MCG/ACT nasal spray Place 2 sprays into both nostrils daily as needed for allergies or rhinitis. Patient not taking: Reported on 07/21/2022 11/04/20   Marcelyn Bruins, MD  hydrOXYzine (ATARAX) 10 MG tablet Take 10 mg by mouth at bedtime. As needed Patient not taking: Reported on 07/21/2022 03/03/22   [provider]  Iron-FA-B Cmp-C-Biot-Probiotic (FUSION PLUS) CAPS Take 1 capsule by mouth daily. Patient not taking: Reported on 07/21/2022 10/08/21   Erenest Blank, NP  levalbuterol [facility] HFA) 45 MCG/ACT inhaler Inhale 2 puffs every 6 hours as needed for wheezing, tightness in chest, or shortness of breath 06/13/22   Nehemiah Settle, FNP  levalbuterol Pauline Aus) 1.25 MG/3ML nebulizer solution Take 1.25 mg by nebulization every 6 (six) hours as needed for wheezing or shortness of breath. 06/15/22   Marcelyn Bruins, MD  levocetirizine (XYZAL) 5 MG tablet Take 1 tablet (5 mg total) by mouth in the morning and at bedtime. 06/15/22   Marcelyn Bruins, MD  Magnesium 200 MG TABS Take 200 mg by mouth daily. Patient not taking: Reported on 07/21/2022    [provider]  metoprolol tartrate (LOPRESSOR) 25 MG tablet Take 0.5 tablets (12.5 mg total) by mouth 2 (two) times daily as needed (palpitations, tachycardia, hypertension). Please DO NOT take if your heart rate is <120 OR your blood pressure is below 120 on top (systolic) and/or below 70 on the bottom(diastolic) 06/14/22 09/08/22  Bradler, Clent Jacks, MD  montelukast (SINGULAIR) 10 MG tablet Take 1 tablet (10 mg total) by mouth at bedtime. Patient not taking: Reported on 07/21/2022 07/13/22   Marcelyn Bruins, MD  Olopatadine HCl (PATADAY) 0.2 % SOLN Place 1 drop into  both eyes daily as needed. 04/06/22   Marcelyn Bruins, MD  Olopatadine-Mometasone [facility]al [facility]) [phone] MCG/ACT SUSP 2 sprays each nostril twice a day as needed for runny or stuffy nose. 04/06/22   Marcelyn Bruins, MD  ondansetron (ZOFRAN-ODT) 4 MG disintegrating tablet Take 4 mg by mouth once.    [provider]  prochlorperazine (COMPAZINE) 10 MG tablet Take 10 mg by mouth every 6 (six) hours as needed. Patient not taking: Reported on 07/21/2022    [provider]  Rimegepant Sulfate (NURTEC) 75 MG TBDP Take 1 tablet by mouth as needed. 01/12/22   [provider]  sertraline (ZOLOFT) 25 MG tablet Take 1 tablet (25 mg total) by mouth daily. Patient not taking: Reported on 09/08/2022 06/12/22 07/12/22  Lorin Glass, MD  tranexamic acid (LYSTEDA) 650 MG TABS tablet Take 1,300 mg by mouth 3 (three) times daily. Patient not taking: Reported on 07/21/2022 05/23/22   [provider]    Family History Family History  Problem Relation Age of Onset   Fibroids Mother    Brain cancer Father    Hypertension Maternal Grandmother    Stroke Maternal Grandmother    Thyroid disease  Maternal Grandmother    Cancer Maternal Grandfather    Alzheimer's disease Maternal Grandfather    Prostate cancer Maternal Grandfather    Alzheimer's disease Paternal Grandmother    Allergic rhinitis Neg Hx    Angioedema Neg Hx    Asthma Neg Hx    Eczema Neg Hx    Immunodeficiency Neg Hx    Urticaria Neg Hx     Social History Social History   Tobacco Use   Smoking status: Never   Smokeless tobacco: Never  Vaping Use   Vaping status: Never Used  Substance Use Topics   Alcohol use: Not Currently   Drug use: Never     Allergies   Bee pollen, Feraheme [ferumoxytol], Ferric carboxymaltose, Fish allergy, Fish oil, Imitrex [sumatriptan], Other, Peach flavor, Peanut oil, Peanut-containing drug products, Pollen extract, Rizatriptan, Shellfish-derived products, Benadryl  allergy [diphenhydramine hcl], Cocoa, Gluten meal, Milk (cow), Topiramate, Eletriptan, Eletriptan hydrobromide, and Tape   Review of Systems Review of Systems   Physical Exam Triage Vital Signs ED Triage Vitals  Encounter Vitals Group     BP 09/20/22 1032 131/87     Systolic BP Percentile --      Diastolic BP Percentile --      Pulse Rate 09/20/22 1032 81     Resp 09/20/22 1032 18     Temp 09/20/22 1032 98.3 F (36.8 C)     Temp Source 09/20/22 1032 Oral     SpO2 09/20/22 1032 96 %     Weight --      Height --      Head Circumference --      Peak Flow --      Pain Score 09/20/22 1034 4     Pain Loc --      Pain Education --      Exclude from Growth Chart --    No data found.  Updated Vital Signs BP 131/87 (BP Location: Left Arm)   Pulse 81   Temp 98.3 F (36.8 C) (Oral)   Resp 18   LMP 08/20/2022   SpO2 96%   Visual Acuity Right Eye Distance:   Left Eye Distance:   Bilateral Distance:    Right Eye Near:   Left Eye Near:    Bilateral Near:     Physical Exam Vitals reviewed.  Constitutional:      General: She is not in acute distress.    Appearance: She is not ill-appearing, toxic-appearing or diaphoretic.  HENT:     Right Ear: Tympanic membrane and ear canal normal.     Left Ear: Tympanic membrane and ear canal normal.     Nose: Nose normal.     Mouth/Throat:     Mouth: Mucous membranes are moist.     Pharynx: No posterior oropharyngeal erythema.  Eyes:     Extraocular Movements: Extraocular movements intact.     Conjunctiva/sclera: Conjunctivae normal.     Pupils: Pupils are equal, round, and reactive to light.  Cardiovascular:     Rate and Rhythm: Normal rate and regular rhythm.     Heart sounds: No murmur heard. Pulmonary:     Effort: Pulmonary effort is normal. No respiratory distress.     Breath sounds: No stridor. No wheezing, rhonchi or rales.  Musculoskeletal:     Cervical back: Neck supple.  Lymphadenopathy:     Cervical: No cervical  adenopathy.  Skin:    Capillary Refill: Capillary refill takes less than 2 seconds.     Coloration: Skin  is not jaundiced or pale.  Neurological:     General: No focal deficit present.     Mental Status: She is alert and oriented to person, place, and time.  Psychiatric:        Behavior: Behavior normal.      UC Treatments / Results  Labs (all labs ordered are listed, but only abnormal results are displayed) Labs Reviewed - No data to display  EKG   Radiology No results found.  Procedures Procedures (including critical care time)  Medications Ordered in UC Medications - No data to display  Initial Impression / Assessment and Plan / UC Course  I have reviewed the triage vital signs and the nursing notes.  Pertinent labs & imaging results that were available during my care of the patient were reviewed by me and considered in my medical decision making (see chart for details).        She has been sick for 10 days; treat for acute sinusitis with Omnicef.  Tessalon Perles were sent in for the cough.  At the end of the visit the patient requests to be retested for flu, COVID, and strep.  I discussed with her that testing would not be helpful for the flu and COVID as she is 10 days into her illness.  I do not think strep testing would be needed as she is going to be taking an antibiotic that would adequately treat that.  Also her exam does not indicate that she needs strep testing at this time. Final Clinical Impressions(s) / UC Diagnoses   Final diagnoses:  Acute sinusitis, recurrence not specified, unspecified location     Discharge Instructions      Take cefdinir 300 mg--2 capsules together daily for 7 days  Take benzonatate 100 mg, 1 tab every 8 hours as needed for cough.  Your tests were all negative when you were seen initially.  I do not think you need retesting for COVID or flu at this point, as you have been sick over the 3 to 5 days timeframe that would  indicate treatment would be helpful for any of those.  Also your exam is not consistent with strep and you are going to be taking an antibiotic that would treat strep very well if you did have it.  So I do not think any repeat testing for strep either       ED Prescriptions     Medication Sig Dispense Auth. Provider   cefdinir (OMNICEF) 300 MG capsule Take 2 capsules (600 mg total) by mouth daily for 7 days. 14 capsule Lazaria Schaben, Janace Aris, MD   benzonatate (TESSALON) 100 MG capsule Take 1 capsule (100 mg total) by mouth 3 (three) times daily as needed for cough. 21 capsule Zenia Resides, MD      PDMP not reviewed this encounter.   Zenia Resides, MD 09/20/22 [id number]

## 2022-09-20 NOTE — Discharge Instructions (Addendum)
Take cefdinir 300 mg--2 capsules together daily for 7 days  Take benzonatate 100 mg, 1 tab every 8 hours as needed for cough.  Your tests were all negative when you were seen initially.  I do not think you need retesting for COVID or flu at this point, as you have been sick over the 3 to 5 days timeframe that would indicate treatment would be helpful for any of those.  Also your exam is not consistent with strep and you are going to be taking an antibiotic that would treat strep very well if you did have it.  So I do not think any repeat testing for strep either

## 2022-09-20 NOTE — ED Triage Notes (Signed)
Pt states "I came in last week and was told if I did not get better to come back in. I have had no voice for 3 days. congested and runny nose. migraines. chills, but no fever".

## 2022-09-22 ENCOUNTER — Telehealth (HOSPITAL_COMMUNITY): Payer: Self-pay

## 2022-09-22 ENCOUNTER — Ambulatory Visit: Admitting: Allergy

## 2022-09-22 NOTE — Telephone Encounter (Signed)
Received MD order with the missing information for cardiac rehab.

## 2022-09-22 NOTE — Telephone Encounter (Signed)
Received pt documents from [facility] for cardiac rehab but the MD order was missing the Date and Time of signature. I refaxed it back stating we needed the date and time. Waiting on the completed MD order to return.

## 2022-10-06 ENCOUNTER — Other Ambulatory Visit: Payer: Self-pay

## 2022-10-06 ENCOUNTER — Ambulatory Visit (INDEPENDENT_AMBULATORY_CARE_PROVIDER_SITE_OTHER): Admitting: Allergy

## 2022-10-06 ENCOUNTER — Ambulatory Visit: Admitting: Allergy

## 2022-10-06 VITALS — BP 102/70 | HR 72 | Temp 98.0°F | Resp 20 | Wt 118.6 lb

## 2022-10-06 DIAGNOSIS — T50905D Adverse effect of unspecified drugs, medicaments and biological substances, subsequent encounter: Secondary | ICD-10-CM

## 2022-10-06 DIAGNOSIS — Z888 Allergy status to other drugs, medicaments and biological substances status: Secondary | ICD-10-CM

## 2022-10-06 DIAGNOSIS — J4540 Moderate persistent asthma, uncomplicated: Secondary | ICD-10-CM | POA: Diagnosis not present

## 2022-10-06 DIAGNOSIS — T781XXD Other adverse food reactions, not elsewhere classified, subsequent encounter: Secondary | ICD-10-CM

## 2022-10-06 DIAGNOSIS — H1013 Acute atopic conjunctivitis, bilateral: Secondary | ICD-10-CM

## 2022-10-06 DIAGNOSIS — J3089 Other allergic rhinitis: Secondary | ICD-10-CM | POA: Diagnosis not present

## 2022-10-06 DIAGNOSIS — L509 Urticaria, unspecified: Secondary | ICD-10-CM

## 2022-10-06 DIAGNOSIS — T7840XD Allergy, unspecified, subsequent encounter: Secondary | ICD-10-CM

## 2022-10-06 DIAGNOSIS — H43393 Other vitreous opacities, bilateral: Secondary | ICD-10-CM

## 2022-10-06 DIAGNOSIS — T7800XD Anaphylactic reaction due to unspecified food, subsequent encounter: Secondary | ICD-10-CM

## 2022-10-06 NOTE — Patient Instructions (Addendum)
Allergic reactions--> [facility] H/o allergy to multiple medications Food allergy Oral allergy syndrome  -Diagnosed with hypermobility syndrome/Ehlers-Danlos syndrome -You do have IgE mediated allergy issues (as below) however also experience reaction type symptoms despite any known triggers ?Idiopathic anaphylaxis -Tryptase, Ckit mutation testing for mastocytosis has been normal.  Tryptase obtained during episodes has also been normal however timing of draw is in question.   Urine histamine studies are normal Urine leukotriene studies are elevated --> unclear as to elevation.  Trialed singulair (anti-leukotriene medication) however not able to be consistent with use -Will do referral process for mast cell experts at [facility] at this time -To simplify current medication regimen as much as possible: Levocetrizine ideally at twice a day  Holding on Cromolyn and Pepcid use Have access to epinephrine device in case of reaction.  Follow emergency action plan in case of allergic reaction - Xolair 375mg  injection tried however developed adverse symptoms and treated with epinephrine.  -Continue your current food avoidance -Food allergy testing has been positive to sesame, milk, pistachio, hops, pork, navy bean, Karaya gum, hazelnut, pecan, cashew, walnut, peach, kiwi, pineapple, banana with previous testing.  All of these levels were very low except for the cashew.  -I do believe you have a component of oral allergy syndrome with fresh or raw fruits related to your pollen allergy (see below) -There could be an irritant effect (like strong odors or noxious odors) can irritate the airway leading to symptoms there could be a role here of airborne food particles that may be inhaled irritating the airway -She has had symptoms including angioedema now 3 times after receiving IV saline.  Would recommend LR if needed fluid  Asthma -Have access to Xopenex inhaler 2 puffs or Xopenex 1 vial via nebulizer every  4-6 hours as needed for cough/wheeze/shortness of breath/chest tightness.  May use 15-20 minutes prior to activity.   Monitor frequency of use.    Allergic rhinitis -Continue avoidance measures for dust mite, dog dander, grass pollen, tree pollen, weed pollen, ragweed pollen, and mold.  -Levocetirizine -Use Ryaltris 2 sprays each nostril twice a day as needed for runny or stuffy nose.  This is a combination spray with mometasone (nasal steroid for congestion control) and olopatadine (nasal antihistamine for drainage control).  -Consider saline nasal rinses as needed for nasal symptoms. Use this before any medicated nasal sprays for best result  Allergic conjunctivitis -Use Pataday one drop in each eye once a day as needed for red, itchy eyes  Urticaria (hives) - Levocetirizine as above - you can use Hydroxyzine when needed for hive control.  This medication is usually sedating.    Visual field changes Memory issues -Symptoms reported today are concerning and recommend she have a prompt evaluation of these changes.  Advised her to call her neurologist with these changes to see if that she can be seen sooner.  I think she would benefit from an MR brain as the symptoms sound like they could be manifestation of MS or some similar neurological issue. -Discussed that I do not know if these symptoms are tied into her reaction issues and/or a mast cell activation issue or not  Follow up in 3-4 months or sooner if needed.

## 2022-10-06 NOTE — Progress Notes (Signed)
Follow-up Note  RE: Theresa Cruz MRN: [id number] DOB: November 17, 1985 Date of Office Visit: 10/06/2022   History of present illness: Theresa Cruz is a 37 y.o. female presenting today for follow-up of allergic reaction secondary to multiple things including medications and foods. She also has history of asthma and allergic rhinitis with conjunctivitis and urticaria. She was last seen in the office on 07/21/2022 by myself for an office visit.  She has been noting more visual and memory changes lately.  She states she had gotten use to her "typical" floaters in her vision and has been to optometry and ophthalmologist who she states told her it was normal.  She however has been seeing a different type of a floater rare that she says it looks like something dark dashes across her visual field across the bottom.  She has been to events and activities and folks have noticed her jumping and struggling because he is seeing things dart across her vision.  She has had episodes where she states her vision just goes black for a short period of time before recovery.  she also states she has been having episodes where she is not sure how she got places.  She states she was driving the other day and got to a certain area and did not remember how she got to that place.  These are quite concerning for her.  She does have a neurologist but states they were not able to get her in for follow-up till November. She had a sinus infection with symptoms of loss of voice, coughing, congestion with thick colored mucus and went to UC and was treated with cefdinir and tessalon perls.  The symptoms did improve. She states she did take the Singulair after the last visit for about 2 to 3 weeks however she did stop.  She states it was just very difficult for her to remember to take it despite having pill packs and keeping it in places where she would see it was still difficult for her to remember to take it on a regular basis.   This has been a struggle for her for a while now with taking maintenance type medications on a regular basis. She is quite tearful today in regards to months been going on with her health and she just wants answers as to what is going on with the symptoms she has been having.  She has been to many doctors and subspecialist at this time without any significant answer as to why she has all the symptoms.  She recently went to a cardiology visit who per patient recommended that she have a more multidisciplinary approach.   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   Review of systems: 10pt ROS negative unless noted above in HPI  Past medical/social/surgical/family history have been reviewed and are unchanged unless specifically indicated below.  No changes  Medication List: Current Outpatient Medications  Medication Sig Dispense Refill   buPROPion (WELLBUTRIN XL) 150 MG 24 hr tablet Take 150 mg by mouth daily.     clobetasol ointment (TEMOVATE) 0.05 % Apply 1 Application topically as needed.     cromolyn (GASTROCROM) 100 MG/5ML solution Take 5 mLs (100 mg total) by mouth daily before breakfast. 600 mL 2   EPINEPHrine 0.3 mg/0.3 mL IJ SOAJ injection Inject 0.3 mg into the muscle as needed for anaphylaxis. 2 each 1   famotidine (PEPCID) 20 MG tablet Take 1 tablet (20 mg total) by mouth 2 (two)  times daily. (Patient taking differently: Take 20 mg by mouth as needed.) 60 tablet 5   fluticasone (FLONASE) 50 MCG/ACT nasal spray Place 2 sprays into both nostrils daily as needed for allergies or rhinitis. 16 g 5   hydrOXYzine (ATARAX) 10 MG tablet Take 10 mg by mouth at bedtime. As needed     Iron-FA-B Cmp-C-Biot-Probiotic (FUSION PLUS) CAPS Take 1 capsule by mouth daily. 30 capsule 6   levalbuterol (XOPENEX HFA) 45 MCG/ACT inhaler Inhale 2 puffs every 6 hours as needed for wheezing, tightness in chest, or shortness of breath 1 each 1   levalbuterol (XOPENEX) 1.25 MG/3ML nebulizer solution Take  1.25 mg by nebulization every 6 (six) hours as needed for wheezing or shortness of breath. 72 mL 1   levocetirizine (XYZAL) 5 MG tablet Take 1 tablet (5 mg total) by mouth in the morning and at bedtime. 60 tablet 5   Magnesium 200 MG TABS Take 200 mg by mouth daily.     Olopatadine HCl (PATADAY) 0.2 % SOLN Place 1 drop into both eyes daily as needed. 2.5 mL 5   Olopatadine-Mometasone (RYALTRIS) 665-25 MCG/ACT SUSP 2 sprays each nostril twice a day as needed for runny or stuffy nose. 29 g 5   ondansetron (ZOFRAN-ODT) 4 MG disintegrating tablet Take 4 mg by mouth once.     prochlorperazine (COMPAZINE) 10 MG tablet Take 10 mg by mouth every 6 (six) hours as needed.     Rimegepant Sulfate (NURTEC) 75 MG TBDP Take 1 tablet by mouth as needed.     tranexamic acid (LYSTEDA) 650 MG TABS tablet Take 1,300 mg by mouth 3 (three) times daily.     metoprolol tartrate (LOPRESSOR) 25 MG tablet Take 0.5 tablets (12.5 mg total) by mouth 2 (two) times daily as needed (palpitations, tachycardia, hypertension). Please DO NOT take if your heart rate is <120 OR your blood pressure is below 120 on top (systolic) and/or below 70 on the bottom(diastolic) 60 tablet 0   sertraline (ZOLOFT) 25 MG tablet Take 1 tablet (25 mg total) by mouth daily. (Patient not taking: Reported on 09/08/2022) 30 tablet 0   No current facility-administered medications for this visit.     Known medication allergies: Allergies  Allergen Reactions   Bee Pollen Shortness Of Breath   Feraheme [Ferumoxytol] Diarrhea    N/V/D, abdominal pain and chest discomfort.    Ferric Carboxymaltose Anaphylaxis, Shortness Of Breath and Nausea And Vomiting   Fish Allergy Hives and Itching   Fish Oil Itching   Imitrex [Sumatriptan] Other (See Comments)    Muscle tension   Other Hives    Tree nuts   Peach Flavor Hives and Shortness Of Breath   Peanut Oil Hives and Itching   Peanut-Containing Drug Products Hives   Pollen Extract Shortness Of Breath    Rizatriptan Other (See Comments)    Muscles tightening   Shellfish-Derived Products Anaphylaxis   Benadryl Allergy [Diphenhydramine Hcl] Nausea And Vomiting   Cocoa Other (See Comments)    Migraine   Gluten Meal Nausea And Vomiting and Other (See Comments)    Migraine   Milk (Cow) Nausea And Vomiting   Topiramate Other (See Comments)    Other Reaction(s): Confusion or altered mental state  Other Reaction(s): Delusions (intolerance)    Muscles tightening   Eletriptan Other (See Comments)   Eletriptan Hydrobromide Other (See Comments)   Tape Rash     Physical examination: Blood pressure 102/70, pulse 72, temperature 98 F (36.7 C), temperature source Temporal,  resp. rate 20, weight 118 lb 9.6 oz (53.8 kg), last menstrual period 08/20/2022, SpO2 100%.  General: Alert, interactive, tearful Lungs: Clear to auscultation without wheezing, rhonchi or rales. {no increased work of breathing. CV: Normal S1, S2 without murmurs. Abdomen: Nondistended Skin: Warm and dry, without lesions or rashes. Extremities:  No clubbing, cyanosis or edema. Neuro:   Grossly intact.  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  Tryptase obtained during episodes has also been normal however timing of draw is in question.   Urine histamine studies are normal Urine leukotriene studies are elevated --> unclear as to elevation.  Trialed singulair (anti-leukotriene medication) however not able to be consistent with use -Will do referral process for mast cell experts at [facility] ([place] at this time -To simplify current  medication regimen as much as possible: Levocetrizine ideally at twice a day  Holding on Cromolyn and Pepcid use Have access to epinephrine device in case of reaction.  Follow emergency action plan in case of allergic reaction - Xolair 375mg  injection tried however developed adverse symptoms and treated with epinephrine.  -Continue your current food avoidance -Food allergy testing has been positive to sesame, milk, pistachio, hops, pork, navy bean, Karaya gum, hazelnut, pecan, cashew, walnut, peach, kiwi, pineapple, banana with previous testing.  All of these levels were very low except for the cashew.  -I do believe you have a component of oral allergy syndrome with fresh or raw fruits related to your pollen allergy (see below) -There could be an irritant effect (like strong odors or noxious odors) can irritate the airway leading to symptoms there could be a role here of airborne food particles that may be inhaled irritating the airway -She has had symptoms including angioedema now 3 times after receiving IV saline.  Would recommend LR if needed fluid  Asthma -Have access to Xopenex inhaler 2 puffs or Xopenex 1 vial via nebulizer every 4-6 hours as needed for cough/wheeze/shortness of breath/chest tightness.  May use 15-20 minutes prior to activity.   Monitor frequency of use.    Allergic rhinitis -Continue avoidance measures for dust mite, dog dander, grass pollen, tree pollen, weed pollen, ragweed pollen, and mold.  -Levocetirizine -Use Ryaltris 2 sprays each nostril twice a day as needed for runny or stuffy nose.  This is a combination spray with mometasone (nasal steroid for congestion control) and olopatadine (nasal antihistamine for drainage control).  -Consider saline nasal rinses as needed for nasal symptoms. Use this before any medicated nasal sprays for best result  Allergic conjunctivitis -Use Pataday one drop in each eye once a day as needed for red, itchy eyes  Urticaria  (hives) - Levocetirizine as above - you can use Hydroxyzine when needed for hive control.  This medication is usually sedating.    Visual field changes Memory issues -Symptoms reported today are concerning and recommend she have a prompt evaluation of these changes.  Advised her to call her neurologist with these changes to see if that she can be seen sooner.  I think she would benefit from an MR brain as the symptoms sound like they could be manifestation of MS or some similar neurological issue. -Discussed that I do not know if these  symptoms are tied into her reaction issues and/or a mast cell activation issue or not  Follow up in 3-4 months or sooner if needed.    I appreciate the opportunity to take part in Lelah's care. Please do not hesitate to contact me with questions.  Sincerely,   Margo Aye, MD Allergy/Immunology [facility]

## 2022-10-07 ENCOUNTER — Encounter: Payer: Self-pay | Admitting: Allergy

## 2022-10-12 ENCOUNTER — Encounter: Payer: Self-pay | Admitting: Allergy

## 2022-10-12 ENCOUNTER — Inpatient Hospital Stay (HOSPITAL_BASED_OUTPATIENT_CLINIC_OR_DEPARTMENT_OTHER): Admitting: Family

## 2022-10-12 ENCOUNTER — Encounter: Payer: Self-pay | Admitting: Family

## 2022-10-12 ENCOUNTER — Inpatient Hospital Stay: Attending: Hematology & Oncology

## 2022-10-12 VITALS — BP 116/94

## 2022-10-12 DIAGNOSIS — D509 Iron deficiency anemia, unspecified: Secondary | ICD-10-CM | POA: Diagnosis present

## 2022-10-12 DIAGNOSIS — Z79899 Other long term (current) drug therapy: Secondary | ICD-10-CM | POA: Insufficient documentation

## 2022-10-12 DIAGNOSIS — R0602 Shortness of breath: Secondary | ICD-10-CM | POA: Insufficient documentation

## 2022-10-12 DIAGNOSIS — D500 Iron deficiency anemia secondary to blood loss (chronic): Secondary | ICD-10-CM

## 2022-10-12 DIAGNOSIS — R42 Dizziness and giddiness: Secondary | ICD-10-CM | POA: Diagnosis not present

## 2022-10-12 DIAGNOSIS — T782XXA Anaphylactic shock, unspecified, initial encounter: Secondary | ICD-10-CM | POA: Insufficient documentation

## 2022-10-12 DIAGNOSIS — R002 Palpitations: Secondary | ICD-10-CM | POA: Insufficient documentation

## 2022-10-12 LAB — CBC WITH DIFFERENTIAL (CANCER CENTER ONLY)
Abs Immature Granulocytes: 0.05 10*3/uL (ref 0.00–0.07)
Basophils Absolute: 0 10*3/uL (ref 0.0–0.1)
Basophils Relative: 1 %
Eosinophils Absolute: 0.1 10*3/uL (ref 0.0–0.5)
Eosinophils Relative: 2 %
HCT: 37 % (ref 36.0–46.0)
Hemoglobin: 12.3 g/dL (ref 12.0–15.0)
Immature Granulocytes: 1 %
Lymphocytes Relative: 31 %
Lymphs Abs: 1.3 10*3/uL (ref 0.7–4.0)
MCH: 31.5 pg (ref 26.0–34.0)
MCHC: 33.2 g/dL (ref 30.0–36.0)
MCV: 94.6 fL (ref 80.0–100.0)
Monocytes Absolute: 0.4 10*3/uL (ref 0.1–1.0)
Monocytes Relative: 8 %
Neutro Abs: 2.5 10*3/uL (ref 1.7–7.7)
Neutrophils Relative %: 57 %
Platelet Count: 150 10*3/uL (ref 150–400)
RBC: 3.91 MIL/uL (ref 3.87–5.11)
RDW: 12.5 % (ref 11.5–15.5)
WBC Count: 4.3 10*3/uL (ref 4.0–10.5)
nRBC: 0 % (ref 0.0–0.2)

## 2022-10-12 LAB — IRON AND IRON BINDING CAPACITY (CC-WL,HP ONLY)
Iron: 123 ug/dL (ref 28–170)
Saturation Ratios: 37 % — ABNORMAL HIGH (ref 10.4–31.8)
TIBC: 332 ug/dL (ref 250–450)
UIBC: 209 ug/dL (ref 148–442)

## 2022-10-12 LAB — FERRITIN: Ferritin: 34 ng/mL (ref 11–307)

## 2022-10-12 LAB — RETICULOCYTES
Immature Retic Fract: 12.3 % (ref 2.3–15.9)
RBC.: 3.92 MIL/uL (ref 3.87–5.11)
Retic Count, Absolute: 47.4 10*3/uL (ref 19.0–186.0)
Retic Ct Pct: 1.2 % (ref 0.4–3.1)

## 2022-10-12 NOTE — Progress Notes (Signed)
Hematology and Oncology Follow Up Visit  Theresa Cruz [id number] 1985/10/27 37 y.o. 10/12/2022   Principle Diagnosis:  Iron deficiency anemia    Current Therapy:        Fusion plus 1 capsule PO daily - has been off the last 2 weeks   Of note, patient had anaphylactic reaction with Injectafer   Interim History:  Theresa Cruz is here today for follow-up. She is still having a lot of issues with severe allergic reactions requiring treatment with epinephrine for anaphylaxis. She is seeing allergist Dr. Legrand Rams and they have discussed her having a bone marrow biopsy as all other testing has been inconclusive. From our standpoint this would be a reasonable choice.  She is also being referred to the [facility] clinic for further eval as well.  She has episodes of dizziness, SOB and palpitations.  No swelling noted in her extremities or face at this time.  Appetite and hydration are fair.  Her cycle is unchanged. No other blood loss noted.  No petechiae.  She is just not able to tolerate IV iron so we will stick solely with Fusion plus PO once daily. This has been on hold along with several of her other medications for a while to give her body a break. She recently restarted some of her oral medications. She will wait for a bit before adding the fusion plus into the mix.   ECOG Performance Status: 1 - Symptomatic but completely ambulatory  Medications:  Allergies as of 10/12/2022       Reactions   Bee Pollen Shortness Of Breath   Feraheme [ferumoxytol] Diarrhea   N/V/D, abdominal pain and chest discomfort.    Ferric Carboxymaltose Anaphylaxis, Shortness Of Breath, Nausea And Vomiting   Fish Allergy Hives, Itching   Fish Oil Itching   Imitrex [sumatriptan] Other (See Comments)   Muscle tension   Other Hives   Tree nuts   Peach Flavor Hives, Shortness Of Breath   Peanut Oil Hives, Itching   Peanut-containing Drug Products Hives   Pollen Extract Shortness Of Breath   Rizatriptan  Other (See Comments)   Muscles tightening   Shellfish-derived Products Anaphylaxis   Benadryl Allergy [diphenhydramine Hcl] Nausea And Vomiting   Cocoa Other (See Comments)   Migraine   Gluten Meal Nausea And Vomiting, Other (See Comments)   Migraine   Milk (cow) Nausea And Vomiting   Topiramate Other (See Comments)   Other Reaction(s): Confusion or altered mental state Other Reaction(s): Delusions (intolerance)    Muscles tightening   Eletriptan Other (See Comments)   Eletriptan Hydrobromide Other (See Comments)   Tape Rash        Medication List        Accurate as of October 12, 2022  8:46 AM. If you have any questions, ask your nurse or doctor.          buPROPion 150 MG 24 hr tablet Commonly known as: WELLBUTRIN XL Take 150 mg by mouth daily.   clobetasol ointment 0.05 % Commonly known as: TEMOVATE Apply 1 Application topically as needed.   cromolyn 100 MG/5ML solution Commonly known as: GASTROCROM Take 5 mLs (100 mg total) by mouth daily before breakfast.   EPINEPHrine 0.3 mg/0.3 mL Soaj injection Commonly known as: EPI-PEN Inject 0.3 mg into the muscle as needed for anaphylaxis.   famotidine 20 MG tablet Commonly known as: PEPCID Take 1 tablet (20 mg total) by mouth 2 (two) times daily. What changed:  when to take this reasons to  take this   fluticasone 50 MCG/ACT nasal spray Commonly known as: FLONASE Place 2 sprays into both nostrils daily as needed for allergies or rhinitis.   Fusion Plus Caps Take 1 capsule by mouth daily.   hydrOXYzine 10 MG tablet Commonly known as: ATARAX Take 10 mg by mouth at bedtime. As needed   levalbuterol 1.25 MG/3ML nebulizer solution Commonly known as: XOPENEX Take 1.25 mg by nebulization every 6 (six) hours as needed for wheezing or shortness of breath.   levalbuterol 45 MCG/ACT inhaler Commonly known as: Xopenex HFA Inhale 2 puffs every 6 hours as needed for wheezing, tightness in chest, or shortness of  breath   levocetirizine 5 MG tablet Commonly known as: XYZAL Take 1 tablet (5 mg total) by mouth in the morning and at bedtime.   Magnesium 200 MG Tabs Take 200 mg by mouth daily.   metoprolol tartrate 25 MG tablet Commonly known as: LOPRESSOR Take 0.5 tablets (12.5 mg total) by mouth 2 (two) times daily as needed (palpitations, tachycardia, hypertension). Please DO NOT take if your heart rate is <120 OR your blood pressure is below 120 on top (systolic) and/or below 70 on the bottom(diastolic)   Nurtec 75 MG Tbdp Generic drug: Rimegepant Sulfate Take 1 tablet by mouth as needed.   Olopatadine HCl 0.2 % Soln Commonly known as: Pataday Place 1 drop into both eyes daily as needed.   ondansetron 4 MG disintegrating tablet Commonly known as: ZOFRAN-ODT Take 4 mg by mouth once.   prochlorperazine 10 MG tablet Commonly known as: COMPAZINE Take 10 mg by mouth every 6 (six) hours as needed.   Ryaltris 161-09 MCG/ACT Susp Generic drug: Olopatadine-Mometasone 2 sprays each nostril twice a day as needed for runny or stuffy nose.   sertraline 25 MG tablet Commonly known as: ZOLOFT Take 1 tablet (25 mg total) by mouth daily.   tranexamic acid 650 MG Tabs tablet Commonly known as: LYSTEDA Take 1,300 mg by mouth 3 (three) times daily.        Allergies:  Allergies  Allergen Reactions   Bee Pollen Shortness Of Breath   Feraheme [Ferumoxytol] Diarrhea    N/V/D, abdominal pain and chest discomfort.    Ferric Carboxymaltose Anaphylaxis, Shortness Of Breath and Nausea And Vomiting   Fish Allergy Hives and Itching   Fish Oil Itching   Imitrex [Sumatriptan] Other (See Comments)    Muscle tension   Other Hives    Tree nuts   Peach Flavor Hives and Shortness Of Breath   Peanut Oil Hives and Itching   Peanut-Containing Drug Products Hives   Pollen Extract Shortness Of Breath   Rizatriptan Other (See Comments)    Muscles tightening   Shellfish-Derived Products Anaphylaxis    Benadryl Allergy [Diphenhydramine Hcl] Nausea And Vomiting   Cocoa Other (See Comments)    Migraine   Gluten Meal Nausea And Vomiting and Other (See Comments)    Migraine   Milk (Cow) Nausea And Vomiting   Topiramate Other (See Comments)    Other Reaction(s): Confusion or altered mental state  Other Reaction(s): Delusions (intolerance)    Muscles tightening   Eletriptan Other (See Comments)   Eletriptan Hydrobromide Other (See Comments)   Tape Rash    Past Medical History, Surgical history, Social history, and Family History were reviewed and updated.  Review of Systems: All other 10 point review of systems is negative.   Physical Exam:  vitals were not taken for this visit.   Wt Readings from Last 3  Encounters:  10/06/22 118 lb 9.6 oz (53.8 kg)  09/08/22 120 lb 9.6 oz (54.7 kg)  06/14/22 119 lb 0.8 oz (54 kg)    Ocular: Sclerae unicteric, pupils equal, round and reactive to light Ear-nose-throat: Oropharynx clear, dentition fair Lymphatic: No cervical or supraclavicular adenopathy Lungs no rales or rhonchi, good excursion bilaterally Heart regular rate and rhythm, no murmur appreciated Abd soft, nontender, positive bowel sounds MSK no focal spinal tenderness, no joint edema Neuro: non-focal, well-oriented, appropriate affect Breasts: Deferred   Lab Results  Component Value Date   WBC 7.0 06/14/2022   HGB 12.8 06/14/2022   HCT 37.8 06/14/2022   MCV 93.8 06/14/2022   PLT 183 06/14/2022   Lab Results  Component Value Date   FERRITIN 10 (L) 04/11/2022   IRON 64 04/11/2022   TIBC 337 04/11/2022   UIBC 273 04/11/2022   IRONPCTSAT 19 04/11/2022   Lab Results  Component Value Date   RETICCTPCT 0.8 04/11/2022   RBC 4.03 06/14/2022   No results found for: "KPAFRELGTCHN", "LAMBDASER", "KAPLAMBRATIO" No results found for: "IGGSERUM", "IGA", "IGMSERUM" No results found for: "TOTALPROTELP", "ALBUMINELP", "A1GS", "A2GS", "BETS", "BETA2SER", "GAMS", "MSPIKE", "SPEI"    Chemistry      Component Value Date/Time   NA 135 06/14/2022 1326   K 3.3 (L) 06/14/2022 1326   CL 103 06/14/2022 1326   CO2 23 06/14/2022 1326   BUN 13 06/14/2022 1326   CREATININE 0.71 06/14/2022 1326   CREATININE 0.78 10/04/2021 1037   CREATININE 0.66 11/12/2015 1439      Component Value Date/Time   CALCIUM 8.9 06/14/2022 1326   ALKPHOS 33 (L) 03/24/2022 0941   AST 22 03/24/2022 0941   AST 17 10/04/2021 1037   ALT 12 03/24/2022 0941   ALT 9 10/04/2021 1037   BILITOT 0.7 03/24/2022 0941   BILITOT 0.7 10/04/2021 1037       Impression and Plan: Theresa Cruz is a pleasant 37 yo African American female with history of iron deficiency anemia.  Iron studies are pending.  Unfortunately she has just not been able to tolerate IV iron.  She will restart her daily fusion plus supplement once she is ready.  Follow-up in 6 months.   Eileen Stanford, NP 9/18/20248:46 AM

## 2022-10-13 NOTE — Addendum Note (Signed)
Addended by: Dollene Cleveland R on: 10/13/2022 03:34 PM   Modules accepted: Orders

## 2022-10-14 NOTE — Progress Notes (Signed)
This RN received phone call from patient in regards to upcoming CT bone marrow biopsy at [place] at 11/08/2022. Patient hs questions regarding procedure and this RN notified patient of procedure and expectations. Patient explains allergies and is very nervous regarding medications given during procedure and allergic reactions that she has sustained in the past. This RN mentioned to patient to bring up any questions/reservations prior to procedure starting when she meets with the IR team.

## 2022-10-25 ENCOUNTER — Encounter: Payer: Self-pay | Admitting: Allergy

## 2022-10-25 ENCOUNTER — Encounter: Payer: Self-pay | Admitting: Family

## 2022-11-07 ENCOUNTER — Other Ambulatory Visit: Payer: Self-pay | Admitting: Radiology

## 2022-11-07 DIAGNOSIS — T7840XA Allergy, unspecified, initial encounter: Secondary | ICD-10-CM

## 2022-11-07 NOTE — Consult Note (Signed)
Chief Complaint: Patient was seen in consultation today for CT guided bone marrow biopsy  Referring Physician(s): Padgett,Shaylar Elease Hashimoto  Supervising Physician: Ruel Favors  Patient Status: [facility] - Out-pt  History of Present Illness: Theresa Cruz is a 37 y.o. female with past medical history significant for asthma, Ehlers-Danlos disease, fibromyalgia, anxiety, hypermobility syndrome, anemia, OCD, POTS and multiple drug allergies. She has a hx of recurrent allergic reactions, and elevated urinary leukotrienes.  Due to concern for possible mast cell abnormalities she presents today for CT-guided bone marrow biopsy for further evaluation.  Past Medical History:  Diagnosis Date   Allergic conjunctivitis 05/19/2016   Allergy    Asthma    Ehlers-Danlos disease    Fibromyalgia 09/22/2018   GAD (generalized anxiety disorder) 12/08/2021   Hypermobility syndrome 09/30/2021   IDA (iron deficiency anemia) 10/04/2021   Mast cell activation (HCC)    Migraines 04/14/2018   Numbness and tingling of lower extremity 04/14/2018   Obsessive compulsive disorder 12/08/2021   Perennial and seasonal allergic rhinitis 05/19/2016   POTS (postural orthostatic tachycardia syndrome)    Seafood allergy 03/15/2011   Last Assessment & Plan: Formatting of this note might be different from the original. Counseled on avoidance. She will bring benadryl and Epipen to [place]. She has allergy alert cards written in mandarin to bring. Formatting of this note might be different from the original. Last Assessment & Plan: Formatting of this note might be different from the original. Counseled on avoidance. She will bring    Urticaria     Past Surgical History:  Procedure Laterality Date   NO PAST SURGERIES      Allergies: Bee pollen, Feraheme [ferumoxytol], Ferric carboxymaltose, Fish allergy, Fish oil, Imitrex [sumatriptan], Other, Peach flavor, Peanut oil, Peanut-containing drug products, Pollen extract,  Rizatriptan, Shellfish-derived products, Benadryl allergy [diphenhydramine hcl], Cocoa, Gluten meal, Milk (cow), Topiramate, Eletriptan, Eletriptan hydrobromide, and Tape  Medications: Prior to Admission medications   Medication Sig Start Date End Date Taking? Authorizing Provider  buPROPion (WELLBUTRIN XL) 150 MG 24 hr tablet Take 150 mg by mouth daily. 05/23/22   [provider]  clobetasol ointment (TEMOVATE) 0.05 % Apply 1 Application topically as needed. Patient not taking: Reported on 10/12/2022 05/23/22   [provider]  cromolyn (GASTROCROM) 100 MG/5ML solution Take 5 mLs (100 mg total) by mouth daily before breakfast. Patient not taking: Reported on 10/12/2022 04/06/22   Marcelyn Bruins, MD  EPINEPHrine 0.3 mg/0.3 mL IJ SOAJ injection Inject 0.3 mg into the muscle as needed for anaphylaxis. Patient not taking: Reported on 10/12/2022 06/11/22   Sherian Maroon A, PA  famotidine (PEPCID) 20 MG tablet Take 1 tablet (20 mg total) by mouth 2 (two) times daily. Patient not taking: Reported on 10/12/2022 07/13/22   Marcelyn Bruins, MD  fluticasone [facility]) 50 MCG/ACT nasal spray Place 2 sprays into both nostrils daily as needed for allergies or rhinitis. Patient not taking: Reported on 10/12/2022 11/04/20   Marcelyn Bruins, MD  hydrOXYzine (ATARAX) 10 MG tablet Take 10 mg by mouth at bedtime. As needed Patient not taking: Reported on 10/12/2022 03/03/22   [provider]  Iron-FA-B Cmp-C-Biot-Probiotic (FUSION PLUS) CAPS Take 1 capsule by mouth daily. Patient not taking: Reported on 10/12/2022 10/08/21   Erenest Blank, NP  levalbuterol [facility] HFA) 45 MCG/ACT inhaler Inhale 2 puffs every 6 hours as needed for wheezing, tightness in chest, or shortness of breath Patient not taking: Reported on 10/12/2022 06/13/22   Nehemiah Settle, FNP  levalbuterol (XOPENEX) 1.25 MG/3ML nebulizer solution Take 1.25 mg by nebulization every 6 (six) hours as needed  for wheezing or shortness of breath. Patient not taking: Reported on 10/12/2022 06/15/22   Marcelyn Bruins, MD  levocetirizine (XYZAL) 5 MG tablet Take 1 tablet (5 mg total) by mouth in the morning and at bedtime. 06/15/22   Marcelyn Bruins, MD  Magnesium 200 MG TABS Take 200 mg by mouth daily. Patient not taking: Reported on 10/12/2022    [provider]  metoprolol tartrate (LOPRESSOR) 25 MG tablet Take 0.5 tablets (12.5 mg total) by mouth 2 (two) times daily as needed (palpitations, tachycardia, hypertension). Please DO NOT take if your heart rate is <120 OR your blood pressure is below 120 on top (systolic) and/or below 70 on the bottom(diastolic) 06/14/22 09/08/22  Bradler, Clent Jacks, MD  Olopatadine HCl (PATADAY) 0.2 % SOLN Place 1 drop into both eyes daily as needed. Patient not taking: Reported on 10/12/2022 04/06/22   Marcelyn Bruins, MD  Olopatadine-Mometasone [facility]) [phone] MCG/ACT SUSP 2 sprays each nostril twice a day as needed for runny or stuffy nose. Patient not taking: Reported on 10/12/2022 04/06/22   Marcelyn Bruins, MD  ondansetron (ZOFRAN-ODT) 4 MG disintegrating tablet Take 4 mg by mouth once. Patient not taking: Reported on 10/12/2022    [provider]  prochlorperazine (COMPAZINE) 10 MG tablet Take 10 mg by mouth every 6 (six) hours as needed. Patient not taking: Reported on 10/12/2022    [provider]  Rimegepant Sulfate (NURTEC) 75 MG TBDP Take 1 tablet by mouth as needed. Patient not taking: Reported on 10/12/2022 01/12/22   [provider]  sertraline (ZOLOFT) 25 MG tablet Take 1 tablet (25 mg total) by mouth daily. 06/12/22 10/12/22  Lorin Glass, MD  tranexamic acid (LYSTEDA) 650 MG TABS tablet Take 1,300 mg by mouth 3 (three) times daily. Patient not taking: Reported on 10/12/2022 05/23/22   [provider]     Family History  Problem Relation Age of Onset   Fibroids Mother    Brain cancer  Father    Hypertension Maternal Grandmother    Stroke Maternal Grandmother    Thyroid disease Maternal Grandmother    Cancer Maternal Grandfather    Alzheimer's disease Maternal Grandfather    Prostate cancer Maternal Grandfather    Alzheimer's disease Paternal Grandmother    Allergic rhinitis Neg Hx    Angioedema Neg Hx    Asthma Neg Hx    Eczema Neg Hx    Immunodeficiency Neg Hx    Urticaria Neg Hx     Social History   Socioeconomic History   Marital status: Single    Spouse name: Not on file   Number of children: Not on file   Years of education: Not on file   Highest education level: Not on file  Occupational History   Not on file  Tobacco Use   Smoking status: Never   Smokeless tobacco: Never  Vaping Use   Vaping status: Never Used  Substance and Sexual Activity   Alcohol use: Not Currently   Drug use: Never   Sexual activity: Not on file  Other Topics Concern   Not on file  Social History Narrative   Not on file   Social Determinants of Health   Financial Resource Strain: Low Risk  (04/25/2022)   Received from [facility], [facility]   Overall Financial Resource Strain (CARDIA)    Difficulty of Paying Living Expenses: Not very  hard  Food Insecurity: No Food Insecurity (06/11/2022)   Hunger Vital Sign    Worried About Running Out of Food in the Last Year: Never true    Ran Out of Food in the Last Year: Never true  Transportation Needs: No Transportation Needs (06/11/2022)   PRAPARE - Administrator, Civil Service (Medical): No    Lack of Transportation (Non-Medical): No  Physical Activity: Insufficiently Active (04/25/2022)   Received from [facility], [facility]   Exercise Vital Sign    Days of Exercise per Week: 1 day    Minutes of Exercise per Session: 10 min  Stress: No Stress Concern Present (07/08/2022)   Received from [facility], [facility] of Occupational Health - Occupational Stress Questionnaire     Feeling of Stress : Not at all  Recent Concern: Stress - Stress Concern Present (04/25/2022)   Received from [facility] of Occupational Health - Occupational Stress Questionnaire    Feeling of Stress : Rather much  Social Connections: Moderately Integrated (04/25/2022)   Received from [facility], [facility]   Social Network    How would you rate your social network (family, work, friends)?: Adequate participation with social networks      Review of Systems;denies fever,HA,CP,dyspnea, cough, abd/back pain, vomiting or bleeding; she is anxious and has occ nausea  Vital Signs: Vitals:   11/08/22 0745  BP: (!) 148/89  Pulse: 80  Resp: 16  Temp: 97.8 F (36.6 C)  SpO2: 100%        Physical Exam: awake/alert; chest- CTA bilat; heart- RRR; abd-soft,+BS,NT; no LE edema  Imaging: No results found.  Labs:  CBC: Recent Labs    06/11/22 1038 06/12/22 0630 06/14/22 1326 10/12/22 0832  WBC 4.9 11.3* 7.0 4.3  HGB 11.7* 11.4* 12.8 12.3  HCT 35.4* 33.5* 37.8 37.0  PLT 130* 145* 183 150    COAGS: No results for input(s): "INR", "APTT" in the last 8760 hours.  BMP: Recent Labs    06/10/22 2030 06/11/22 1038 06/12/22 0630 06/14/22 1326  NA 137 136 138 135  K 3.9 3.8 3.9 3.3*  CL 106 108 110 103  CO2 23 22 21* 23  GLUCOSE 133* 91 124* 121*  BUN 11 11 14 13   CALCIUM 9.2 8.7* 9.0 8.9  CREATININE 0.68 0.66 0.70 0.71  GFRNONAA >60 >60 >60 >60    LIVER FUNCTION TESTS: Recent Labs    03/24/22 0941  BILITOT 0.7  AST 22  ALT 12  ALKPHOS 33*  PROT 7.2  ALBUMIN 3.6    TUMOR MARKERS: No results for input(s): "AFPTM", "CEA", "CA199", "CHROMGRNA" in the last 8760 hours.  Assessment and Plan: 37 y.o. female with past medical history significant for asthma, Ehlers-Danlos disease, fibromyalgia, anxiety, hypermobility syndrome, anemia, OCD, POTS and multiple drug allergies. She has a hx of recurrent allergic reactions, and  elevated urinary leukotrienes.  Due to concern for possible mast cell abnormalities she presents today for CT-guided bone marrow biopsy for further evaluation.Risks and benefits of procedure was discussed with the patient /mother  including, but not limited to bleeding, infection, damage to adjacent structures or low yield requiring additional tests.  All of the questions were answered and there is agreement to proceed.  Consent signed and in chart.    Thank you for this interesting consult.  I greatly enjoyed meeting Theresa Cruz and look forward to participating in their care.  A copy of  this report was sent to the requesting provider on this date.  Electronically Signed: D. Jeananne Rama, PA-C 11/07/2022, 1:13 PM   I spent a total of 20 minutes  in face to face in clinical consultation, greater than 50% of which was counseling/coordinating care for CT-guided bone marrow biopsy

## 2022-11-08 ENCOUNTER — Ambulatory Visit (HOSPITAL_COMMUNITY)
Admission: RE | Admit: 2022-11-08 | Discharge: 2022-11-08 | Disposition: A | Discharge status: Home / Self Care | Source: Ambulatory Visit | Attending: Allergy | Admitting: Allergy

## 2022-11-08 ENCOUNTER — Encounter (HOSPITAL_COMMUNITY): Payer: Self-pay

## 2022-11-08 ENCOUNTER — Ambulatory Visit (HOSPITAL_COMMUNITY)
Admission: RE | Admit: 2022-11-08 | Discharge: 2022-11-08 | Disposition: A | Discharge status: Home / Self Care | Source: Ambulatory Visit | Attending: Anesthesiology | Admitting: Anesthesiology

## 2022-11-08 ENCOUNTER — Other Ambulatory Visit: Payer: Self-pay

## 2022-11-08 DIAGNOSIS — M797 Fibromyalgia: Secondary | ICD-10-CM | POA: Diagnosis not present

## 2022-11-08 DIAGNOSIS — F411 Generalized anxiety disorder: Secondary | ICD-10-CM | POA: Diagnosis not present

## 2022-11-08 DIAGNOSIS — Z889 Allergy status to unspecified drugs, medicaments and biological substances status: Secondary | ICD-10-CM | POA: Diagnosis not present

## 2022-11-08 DIAGNOSIS — D649 Anemia, unspecified: Secondary | ICD-10-CM | POA: Insufficient documentation

## 2022-11-08 DIAGNOSIS — Z9109 Other allergy status, other than to drugs and biological substances: Secondary | ICD-10-CM | POA: Insufficient documentation

## 2022-11-08 DIAGNOSIS — J45909 Unspecified asthma, uncomplicated: Secondary | ICD-10-CM | POA: Diagnosis not present

## 2022-11-08 DIAGNOSIS — Q796 Ehlers-Danlos syndrome, unspecified: Secondary | ICD-10-CM | POA: Diagnosis not present

## 2022-11-08 DIAGNOSIS — F429 Obsessive-compulsive disorder, unspecified: Secondary | ICD-10-CM | POA: Diagnosis not present

## 2022-11-08 DIAGNOSIS — T7840XA Allergy, unspecified, initial encounter: Secondary | ICD-10-CM

## 2022-11-08 LAB — CBC WITH DIFFERENTIAL/PLATELET
Abs Immature Granulocytes: 0.01 10*3/uL (ref 0.00–0.07)
Basophils Absolute: 0 10*3/uL (ref 0.0–0.1)
Basophils Relative: 1 %
Eosinophils Absolute: 0.1 10*3/uL (ref 0.0–0.5)
Eosinophils Relative: 1 %
HCT: 42.5 % (ref 36.0–46.0)
Hemoglobin: 13.8 g/dL (ref 12.0–15.0)
Immature Granulocytes: 0 %
Lymphocytes Relative: 27 %
Lymphs Abs: 1.3 10*3/uL (ref 0.7–4.0)
MCH: 31.7 pg (ref 26.0–34.0)
MCHC: 32.5 g/dL (ref 30.0–36.0)
MCV: 97.5 fL (ref 80.0–100.0)
Monocytes Absolute: 0.4 10*3/uL (ref 0.1–1.0)
Monocytes Relative: 7 %
Neutro Abs: 3.1 10*3/uL (ref 1.7–7.7)
Neutrophils Relative %: 64 %
Platelets: 175 10*3/uL (ref 150–400)
RBC: 4.36 MIL/uL (ref 3.87–5.11)
RDW: 12.8 % (ref 11.5–15.5)
WBC: 4.8 10*3/uL (ref 4.0–10.5)
nRBC: 0 % (ref 0.0–0.2)

## 2022-11-08 MED ORDER — LIDOCAINE HCL (PF) 1 % IJ SOLN
INTRAMUSCULAR | Status: DC | PRN
  Administered 2022-11-08: 10 mL via INTRADERMAL

## 2022-11-08 MED ORDER — EPINEPHRINE PF 1 MG/ML IJ SOLN
INTRAMUSCULAR | Status: AC
  Filled 2022-11-08: qty 1

## 2022-11-08 MED ORDER — IBUPROFEN 200 MG PO TABS
400.0000 mg | ORAL_TABLET | Freq: Once | ORAL | Status: AC
  Administered 2022-11-08: 400 mg via ORAL
  Filled 2022-11-08: qty 2

## 2022-11-08 MED ORDER — LACTATED RINGERS IV SOLN: INTRAVENOUS | Status: DC

## 2022-11-08 MED ORDER — FLUMAZENIL 0.5 MG/5ML IV SOLN
INTRAVENOUS | Status: AC
  Filled 2022-11-08: qty 5

## 2022-11-08 MED ORDER — MIDAZOLAM HCL 2 MG/2ML IJ SOLN
INTRAMUSCULAR | Status: AC
  Filled 2022-11-08: qty 4

## 2022-11-08 MED ORDER — FENTANYL CITRATE (PF) 100 MCG/2ML IJ SOLN
INTRAMUSCULAR | Status: AC
  Filled 2022-11-08: qty 4

## 2022-11-08 MED ORDER — NALOXONE HCL 0.4 MG/ML IJ SOLN
INTRAMUSCULAR | Status: AC
  Filled 2022-11-08: qty 1

## 2022-11-08 NOTE — Procedures (Signed)
Interventional Radiology Procedure Note  Procedure: CT BM ASP AND CORE     Complications: None  Estimated Blood Loss:  0  Findings: 11 G CORE AND ASP    M. Ruel Favors, MD

## 2022-11-08 NOTE — Discharge Instructions (Signed)

## 2022-11-08 NOTE — Progress Notes (Signed)
0800 IV fluid is not running patient afraid she maybe allergic.  Saline locked.

## 2022-11-10 ENCOUNTER — Telehealth: Payer: Self-pay | Admitting: Allergy

## 2022-11-10 ENCOUNTER — Telehealth: Payer: Self-pay | Admitting: *Deleted

## 2022-11-10 LAB — SURGICAL PATHOLOGY

## 2022-11-10 NOTE — Telephone Encounter (Signed)
Call received from patient requesting results of BM biopsy results.  BM bx results reviewed by Dr. Myna Hidalgo.  Call placed back to patient and patient notified per order of Dr. Myna Hidalgo that the [facility] biopsy shows no BM disorder.  Pt is appreciative of call back and has no questions at this time.

## 2022-11-10 NOTE — Telephone Encounter (Signed)
Patient called stating she had a bone marrow biopsy done and can't read the results. Patient would like for someone to give her a call back and read the results to her.

## 2022-11-10 NOTE — Telephone Encounter (Signed)
Error

## 2022-11-13 ENCOUNTER — Encounter: Payer: Self-pay | Admitting: Allergy

## 2022-11-14 NOTE — Telephone Encounter (Signed)
Patient called in to check on biopsy results.   Advised patient message would be routed. Advised patient Dr. Delorse Lek is out of office on Mondays. Patient verbalized understanding.   Patient can be reached via mychart or phone, [phone] whichever works best.

## 2022-11-15 ENCOUNTER — Encounter: Payer: Self-pay | Admitting: Family

## 2022-11-16 ENCOUNTER — Encounter (HOSPITAL_COMMUNITY): Payer: Self-pay

## 2022-11-22 ENCOUNTER — Telehealth: Payer: Self-pay | Admitting: Allergy

## 2022-11-22 NOTE — Telephone Encounter (Signed)
Per Provider:  I did not order any genetic testing; it might be something that is standardly done when they do bone marrow biopsy.  It looks like however it is just a karyotype which looks at DNA structure but determines female versus female sex.  Her karyotype is consistent with female sex.  That is all I am able to conclude from that.  Yes she needs to have a hematology referral to Dr. Shirline Frees for "recurrent allergic reactions with urinary leukotrienes with bone marrow biopsy done with some abnormalities however not consistent with mastocytosis."   Called patient - DOB verified - advised of above provider notation.   Patient stated she is wanting lab results from the actual bone marrow biopsy due to what she has researched - paired w/provider notation via myChart - whether or not she is in early stages of Leukemia.  Patient stated she thought the referral  had already been done per the following 11/15/22 myChart message:   I did hear back from my personal colleague in hematology and she agreed with the referral to a hematologist as there is some concern about parts of the marrow being reported as hypocellular (low cell levels) and some possible subtle findings that a hematologist would be better able to sort through.   Patient stated she definitely would like the referral to be done as soon as possible.   Patient advised message would be forwarded to provider as update.  Patient verbalized understanding, no further questions.

## 2022-11-22 NOTE — Telephone Encounter (Signed)
Pt informed we jsut got results and dr Delorse Lek has not seen them and soon as she looks at them we will call back also inquiring about referral to  dr [facility] hematologist

## 2022-11-22 NOTE — Telephone Encounter (Signed)
Patient calling in for lab results please advise  

## 2022-11-23 NOTE — Telephone Encounter (Signed)
Per Provider:  or [facility] staff--  yes I too thought that hematology referral was already made as well.  Can someone check if it has been made.   -------------------------------------------------------------------- For the pt-- I will need to defer to the hematologist interpretation of the results as I can not provide any recommendations on what the results mean.  It does appear there is some hypocellularity which means decreased cell production which may or may not equate to a problem or a diagnosis.  I am making sure the referral has been placed for you.   Called patient - DOB verified - advised of provider notation above.  Patient advised I am forwarding referral request to our Adm team as STAT to have referral done. Once the referral has been scheduled - she will be contacted by the hematology office or our office.  Patient verbalized understanding to all, no further questions.

## 2022-11-23 NOTE — Telephone Encounter (Signed)
THANK YOU Dee!!!!

## 2022-11-23 NOTE — Progress Notes (Signed)
I reached out to the pt to return her call and gather more information about the VM she had left me regarding discussions about her test results between her allergist and Dr.Mohamed. IB message sent to Dr Arbutus Ped. My call went straight to VM. I left a VM asking for a return call.

## 2022-11-23 NOTE — Telephone Encounter (Signed)
Unfortunately no one sent me a message on this patient. I have placed her referral as urgent to Dr. Velora Heckler. Mohamed. I will update the patient via MyChart.

## 2022-11-30 ENCOUNTER — Telehealth: Payer: Self-pay | Admitting: Allergy

## 2022-11-30 ENCOUNTER — Encounter: Payer: Self-pay | Admitting: Allergy

## 2022-11-30 NOTE — Telephone Encounter (Signed)
Patient called wanting the interpretation of her bone marrow biopsy results. Patient would like to know if Dr. Jerolyn Center could interpret the results and then have Dr. Delorse Lek get back to her as soon as possible. She stated she has been waiting 2 weeks too long for bad news and would like to find out what is going on before her December appointment . Patient would like a response through her mychart message. Please review mychart message for further concerns. I did inform her that Dr, Delorse Lek is still seeing patient that is why she has not been able to respond to her recent message.

## 2022-11-30 NOTE — Telephone Encounter (Addendum)
Called patient back - DOB verified - stated she has had to go the Dr. Asa Lente office to obtain an office appointment due to not hearing from their office - referral was placed on 11/23/22.  Patient stated she has an appointment for Thursday, 12/29/22 - soonest that she can be seen.  Patient is requesting to see if Dr. Delorse Lek can contact Dr. Arbutus Ped - have him review the bone marrow biopsy results and make a formative diagnosis - then either sending her an updated message via mychart or calling her.  Patient stated she just wants to know what is going with her health so she can then implement what is needed to better herself per provider recommendations.  Patient advised message would be forwarded to provider.  Patient verbalized understanding to all, no further questions.

## 2022-11-30 NOTE — Telephone Encounter (Signed)
Patient called about bone marrow biopsy results and requested a call back.

## 2022-12-05 NOTE — Telephone Encounter (Signed)
Per Provider:  I am not able to provide any type of diagnosis from the biopsy as this is out of the scope of my training.  I have reached out to Dr Arbutus Ped to see if he can provide any guidance with the biopsy report that I can then relay back to the patient.  I am awaiting this response.    I do apologize the referral for the appointment with Dr Arbutus Ped did not seem to be initiated when I thought but glad it has been scheduled.         Patient was notified via her myChart.

## 2022-12-07 ENCOUNTER — Encounter: Payer: Self-pay | Admitting: Family

## 2022-12-07 NOTE — Telephone Encounter (Addendum)
-----   Message from [facility] Padgett sent at 12/06/2022  1:47 PM EST ----- Since you are well aware of the situation I only sent this to you.     Can you relay in a Mychart or by phone the following:   Hi Jaella,     I reached out to Dr Arbutus Ped and he did get back to me.  His message back is as follows "I looked at the Bone marrow results and Cytogenetics and I do not see anything alarming."   He does not see a need to warrant the appointment based on the results.   He did advise "If you need a hematology second opinion regarding her condition in [place], that he recommended Dr. Candise Che or Dr. Leonides Schanz."  This would be moreso in regards to your iron deficiency anemia and management or any other hematologic needs.    Dr Leighton Parody patient - DOB verified - advised of above provider notation. Patient advise I would also send provider advice to her myChart as well.  Patient verbalized understanding to all, thanked Dr. Delorse Lek for help!!!

## 2022-12-08 ENCOUNTER — Encounter: Payer: Self-pay | Admitting: Allergy

## 2022-12-16 NOTE — Telephone Encounter (Signed)
-----   Message from [employer] Padgett sent at 12/16/2022  1:15 PM EST ----- Regarding: letter Please create into a letter. ---------------------------------------------------------------  To whom it may concern:  Theresa Cruz is a patient of mine at the [facility].  Due to her history of allergic conditions I have recommended that she be exempt from receiving influenza vaccination.  Given the nature of her previous reactions the risk of reaction to the influenza vaccine outweighs the benefit of influenzal protective titers.  She is recommended to continue good hand hygiene and to limit/avoid exposure to known sick contacts. Appropriate to contact my office for any further questions/concerns.  Sincerely,    Margo Aye, MD [facility]

## 2022-12-21 ENCOUNTER — Telehealth: Payer: Self-pay | Admitting: *Deleted

## 2022-12-21 ENCOUNTER — Encounter: Payer: Self-pay | Admitting: Allergy

## 2022-12-21 NOTE — Telephone Encounter (Signed)
A nurse at the cancer center called and wanted to let Dr. Delorse Lek know that the referral that was sent wasn't in there scope because this isn't a hematological problem and that the bone marrow looked normal and no evidence of mass cell neoplasm and that Dr. Delorse Lek may need to send a referral elsewhere.

## 2022-12-21 NOTE — Progress Notes (Signed)
Pt and referring provider notified that after review of pt's chart and BM bx -- this does not appear to be a primary hematologic issue. BM appears wnl--- no evidence of mast cell neoplasm  Pt is established at [facility] office and informed to follow up there if needed.

## 2022-12-21 NOTE — Telephone Encounter (Signed)
Theresa Cruz  P [facility] (supporting Shaylar Larose Hires, MD)30 minutes ago (3:45 PM)    Hi. Can someone please call me. I've been trying much of the afternoon to get through and can't it it sends me to HP.      Spoke with pt- she is a little frustrated knowing that her referral appointment had been canceled. She has tried calling the pathologist and they were not able to give her answers because they stated they need to consult with a provider. She says Dr. Miles Costain the radiologist stated that Dr. Delorse Lek can consult with him if needed. She also wants to know if there was any update on sending to [facility] clinic? After 2 weeks with no answers and her increased symptoms she is very concerned about what the next steps are? Dr. Delorse Lek please advise. Richiah says Dr. Delorse Lek can send her direct a MyChart message.

## 2022-12-27 NOTE — Telephone Encounter (Signed)
Routed message to pt via mychart.

## 2022-12-29 ENCOUNTER — Inpatient Hospital Stay

## 2022-12-29 ENCOUNTER — Inpatient Hospital Stay: Admitting: Internal Medicine

## 2023-01-05 ENCOUNTER — Encounter: Payer: Self-pay | Admitting: Allergy

## 2023-01-05 ENCOUNTER — Ambulatory Visit: Admitting: Allergy

## 2023-01-05 VITALS — BP 149/114 | HR 68 | Ht 63.0 in | Wt 118.0 lb

## 2023-01-05 DIAGNOSIS — H1013 Acute atopic conjunctivitis, bilateral: Secondary | ICD-10-CM

## 2023-01-05 DIAGNOSIS — L509 Urticaria, unspecified: Secondary | ICD-10-CM

## 2023-01-05 DIAGNOSIS — Z888 Allergy status to other drugs, medicaments and biological substances status: Secondary | ICD-10-CM

## 2023-01-05 DIAGNOSIS — T7800XD Anaphylactic reaction due to unspecified food, subsequent encounter: Secondary | ICD-10-CM

## 2023-01-05 DIAGNOSIS — T7840XD Allergy, unspecified, subsequent encounter: Secondary | ICD-10-CM | POA: Diagnosis not present

## 2023-01-05 DIAGNOSIS — J3089 Other allergic rhinitis: Secondary | ICD-10-CM | POA: Diagnosis not present

## 2023-01-05 DIAGNOSIS — T50905D Adverse effect of unspecified drugs, medicaments and biological substances, subsequent encounter: Secondary | ICD-10-CM

## 2023-01-05 DIAGNOSIS — J4540 Moderate persistent asthma, uncomplicated: Secondary | ICD-10-CM

## 2023-01-05 DIAGNOSIS — T781XXD Other adverse food reactions, not elsewhere classified, subsequent encounter: Secondary | ICD-10-CM

## 2023-01-05 MED ORDER — EPINEPHRINE 0.3 MG/0.3ML IJ SOAJ: 0.3000 mg | INTRAMUSCULAR | 1 refills | Status: DC | PRN

## 2023-01-05 NOTE — Patient Instructions (Addendum)
Allergic reactions H/o allergy to multiple medications Food allergy Oral allergy syndrome  - Diagnosed with hypermobility syndrome/Ehlers-Danlos syndrome - You do have IgE mediated allergy issues however also experience reaction type symptoms despite any known triggers -->?Idiopathic anaphylaxis vs MCAS - Tryptase, Ckit mutation testing for mastocytosis as well as bone marrow biopsy has been normal.  Tryptase obtained during episodes has also been normal however timing of draw is in question.   Urine histamine studies have been normal Urine leukotriene studies are elevated --> unclear as to elevation.  Trialed singulair (anti-leukotriene medication).  - Referral for mast cell experts at [facility] [place] has been submitted.   - Will place referral to [facility] allergy for allergic reactions to medications - To simplify current medication regimen as much as possible: Levocetrizine 1-2 times a day (max dosing is 4 tabs/day) Holding on Singulair, Cromolyn and Pepcid use Have access to epinephrine device in case of reaction.  Follow emergency action plan in case of allergic reaction - Xolair 375mg  injection tried however developed adverse symptoms and treated with epinephrine.  - Continue your current food avoidance - Food allergy testing has been positive to sesame, milk, pistachio, hops, pork, navy bean, Karaya gum, hazelnut, pecan, cashew, walnut, peach, kiwi, pineapple, banana with previous testing.  All of these levels were very low except for the cashew.  - I do believe you have a component of oral allergy syndrome with fresh or raw fruits related to your pollen allergy (see below) - There is an irritant effect (like strong odors or noxious odors) can irritate the airway leading to symptoms there could be a role here of airborne food particles that may be inhaled irritating the airway - She has had symptoms including angioedema now 3 times after receiving IV saline.  Would recommend LR if needed  fluid  Asthma - Have access to Xopenex inhaler 2 puffs or Xopenex 1 vial via nebulizer every 4-6 hours as needed for cough/wheeze/shortness of breath/chest tightness.  May use 15-20 minutes prior to activity.   Monitor frequency of use.    Allergic rhinitis with conjunctivitis - Continue avoidance measures for dust mite, dog dander, grass pollen, tree pollen, weed pollen, ragweed pollen, and mold.  - Levocetirizine as above  Urticaria (hives) - Levocetirizine as above - You can use Hydroxyzine when needed for hive control.  This medication is usually sedating.    Visual field changes, Memory issues --> ?focal seizures - Brain MRI performed was normal - She has been referred to epitologist by her neurologist and will undergo EEG testing - She has been recommended to start Keppra.   Discussed and recommend to start with 250mg  dose (half tab 500mg ) and take in early evening to ensure she tolerates for several days.  And if so can increase to the 500mg  dose recommended with advanvement to 1000mg  per neurology recs  Follow up in 3-4 months or sooner if needed.

## 2023-01-05 NOTE — Progress Notes (Signed)
RE: Tichina Jobst MRN: [id number] DOB: 06/17/1985 Date of Telemedicine Visit: 01/05/2023  Primary care provider: Cyril Mourning, FNP  Chief Complaint: Allergic Rhinitis  (Says she is the same. Stated that there is no improvement. )  Telemedicine Follow Up Visit via Telephone: I connected with Armahni Vincenti for a follow up on 01/05/23 by telephone and verified that I am speaking with the correct person using two identifiers.   I discussed the limitations, risks, security and privacy concerns of performing an evaluation and management service by telephone and the availability of in person appointments. I also discussed with the patient that there may be a patient responsible charge related to this service. The patient expressed understanding and agreed to proceed.  Patient is at hospital with a family member Provider is at the office.  Visit start time: 1150 Visit end time: 1222 Insurance consent/check in by: [facility] Medical consent and medical assistant/nurse: Lucretia  History of Present Illness: She is a 37 y.o. female, who is being followed for allergic disease (MCAS, food allergy, allergic rhinoconjunctivitis, oral allergy syndrome, asthma, urticaria). Her previous allergy office visit was on 10/06/22 with Dr. Delorse Lek.   She reports no significant changes in her condition since her last visit in September, with persistent reactions to certain smells and airborne substances that mostly triggers migraine and nausea symptoms.  She has been referred to an epilepsy specialist by her neurologist, who has reviewed her MRI results. She has a take-home EEG scheduled for next month and has requested an inpatient three-day EEG assessment due to the sporadic nature of her symptoms. She describes these episodes as periods of gazing off into space, with occasional out-of-body experiences and memory lapses.  She also reports continued fluctuations in blood pressure, with readings ranging from  149/114 to 79/53 within a span of six minutes. She has started tracking these changes using a smart ring that measures pulse and sleep patterns.  She is also taking levocetirizine for environmental allergies and has an EpiPen for emergency use. She has not needed to use the EpiPen since her last visit in September but has ha epinephrine administered twice this year (one in my office after Xolair administratoin) and another by ED.   She also still reports occasional itchiness and swelling in her throat, which she manages with additional doses of levocetirizine as needed.  She has gotten essentially use to the symptoms.    Assessment and Plan: Arrianna is a 37 y.o. female with:   Allergic reactions H/o allergy to multiple medications Food allergy Oral allergy syndrome  - Diagnosed with hypermobility syndrome/Ehlers-Danlos syndrome - You do have IgE mediated allergy issues however also experience reaction type symptoms despite any known triggers -->?Idiopathic anaphylaxis vs MCAS - Tryptase, Ckit mutation testing for mastocytosis as well as bone marrow biopsy has been normal.  Tryptase obtained during episodes has also been normal however timing of draw is in question.   Urine histamine studies have been normal Urine leukotriene studies are elevated --> unclear as to elevation.  Trialed singulair (anti-leukotriene medication).  - Referral for mast cell experts at [facility] ([place] has been submitted.   - Will place referral to [facility] allergy for allergic reactions to medications - To simplify current medication regimen as much as possible: Levocetrizine 1-2 times a day (max dosing is 4 tabs/day) Holding on Singulair, Cromolyn and Pepcid use Have access to epinephrine device in case of reaction.  Follow emergency action plan in case of allergic reaction - Xolair 375mg  injection  tried however developed adverse symptoms and treated with epinephrine.  - Continue your current food avoidance - Food  allergy testing has been positive to sesame, milk, pistachio, hops, pork, navy bean, Karaya gum, hazelnut, pecan, cashew, walnut, peach, kiwi, pineapple, banana with previous testing.  All of these levels were very low except for the cashew.  - I do believe you have a component of oral allergy syndrome with fresh or raw fruits related to your pollen allergy (see below) - There is an irritant effect (like strong odors or noxious odors) can irritate the airway leading to symptoms there could be a role here of airborne food particles that may be inhaled irritating the airway - She has had symptoms including angioedema now 3 times after receiving IV saline.  Would recommend LR if needed fluid  Asthma - Have access to Xopenex inhaler 2 puffs or Xopenex 1 vial via nebulizer every 4-6 hours as needed for cough/wheeze/shortness of breath/chest tightness.  May use 15-20 minutes prior to activity.   Monitor frequency of use.    Allergic rhinitis with conjunctivitis - Continue avoidance measures for dust mite, dog dander, grass pollen, tree pollen, weed pollen, ragweed pollen, and mold.  - Levocetirizine as above  Urticaria (hives) - Levocetirizine as above - You can use Hydroxyzine when needed for hive control.  This medication is usually sedating.    Visual field changes, Memory issues --> ?focal seizures - Brain MRI performed was normal - She has been referred to epitologist by her neurologist and will undergo EEG testing - She has been recommended to start Keppra.   Discussed and recommend to start with 250mg  dose (half tab 500mg ) and take in early evening to ensure she tolerates for several days.  And if so can increase to the 500mg  dose recommended with advanvement to 1000mg  per neurology recs  Follow up in 3-4 months or sooner if needed.    Diagnostics: None.  Medication List:  Current Outpatient Medications  Medication Sig Dispense Refill   buPROPion (WELLBUTRIN XL) 150 MG 24 hr tablet Take  150 mg by mouth daily.     EPINEPHrine 0.3 mg/0.3 mL IJ SOAJ injection Inject 0.3 mg into the muscle as needed for anaphylaxis. 2 each 1   ketorolac (TORADOL) 15 MG/ML injection Inject 15 mg into the muscle once.     levalbuterol (XOPENEX HFA) 45 MCG/ACT inhaler Inhale 2 puffs every 6 hours as needed for wheezing, tightness in chest, or shortness of breath 1 each 1   levalbuterol (XOPENEX) 1.25 MG/3ML nebulizer solution Take 1.25 mg by nebulization every 6 (six) hours as needed for wheezing or shortness of breath. 72 mL 1   levocetirizine (XYZAL) 5 MG tablet Take 1 tablet (5 mg total) by mouth in the morning and at bedtime. 60 tablet 5   metoprolol tartrate (LOPRESSOR) 25 MG tablet Take 0.5 tablets (12.5 mg total) by mouth 2 (two) times daily as needed (palpitations, tachycardia, hypertension). Please DO NOT take if your heart rate is <120 OR your blood pressure is below 120 on top (systolic) and/or below 70 on the bottom(diastolic) 60 tablet 0   Rimegepant Sulfate (NURTEC) 75 MG TBDP Take 1 tablet by mouth as needed.     sertraline (ZOLOFT) 25 MG tablet Take 1 tablet (25 mg total) by mouth daily. 30 tablet 0   clobetasol ointment (TEMOVATE) 0.05 % Apply 1 Application topically as needed. (Patient not taking: Reported on 01/05/2023)     famotidine (PEPCID) 20 MG tablet Take 1  tablet (20 mg total) by mouth 2 (two) times daily. (Patient not taking: Reported on 01/05/2023) 60 tablet 5   montelukast (SINGULAIR) 10 MG tablet Take 10 mg by mouth at bedtime. (Patient not taking: Reported on 01/05/2023)     Olopatadine HCl (PATADAY) 0.2 % SOLN Place 1 drop into both eyes daily as needed. (Patient not taking: Reported on 01/05/2023) 2.5 mL 5   Olopatadine-Mometasone (RYALTRIS) 665-25 MCG/ACT SUSP 2 sprays each nostril twice a day as needed for runny or stuffy nose. (Patient not taking: Reported on 01/05/2023) 29 g 5   No current facility-administered medications for this visit.   Allergies: Allergies   Allergen Reactions   Ferric Carboxymaltose Anaphylaxis, Shortness Of Breath and Nausea And Vomiting   Justicia Adhatoda (Malabar Nut Tree) [Justicia Adhatoda] Anaphylaxis   Peach Flavoring Agent (Non-Screening) Hives and Shortness Of Breath   Shellfish-Derived Products Anaphylaxis   Bee Pollen Cough   Benadryl Allergy [Diphenhydramine Hcl] Nausea And Vomiting   Cocoa Other (See Comments)    Migraine   Feraheme [Ferumoxytol] Diarrhea    N/V/D, abdominal pain and chest discomfort.    Fish Allergy Hives and Itching   Gluten Meal Nausea And Vomiting and Other (See Comments)    Migraine   Imitrex [Sumatriptan] Other (See Comments)    Muscle tension   Milk (Cow) Nausea And Vomiting   Peanut Oil Hives and Itching   Peanut-Containing Drug Products Hives   Pollen Extract Itching and Cough   Topiramate Other (See Comments)    Other Reaction(s): Confusion or altered mental state  Other Reaction(s): Delusions (intolerance)    Muscles tightening   Eletriptan Other (See Comments)   Eletriptan Hydrobromide Other (See Comments)   Fish Oil Itching   Rizatriptan Other (See Comments)    Muscles tightening   Tape Rash   I reviewed her past medical history, social history, family history, and environmental history and no significant changes have been reported from previous visit on 10/06/22.  Review of Systems negative unless noted above in HPI Objective: Physical Exam Not obtained as encounter was done via telephone.   Previous notes and tests were reviewed.  I discussed the assessment and treatment plan with the patient. The patient was provided an opportunity to ask questions and all were answered. The patient agreed with the plan and demonstrated an understanding of the instructions.   The patient was advised to call back or seek an in-person evaluation if the symptoms worsen or if the condition fails to improve as anticipated.  I provided 32 minutes of non-face-to-face time during this  encounter.  It was my pleasure to participate in [place] Tropea's care today. Please feel free to contact me with any questions or concerns.   Sincerely,  Otha Rickles Larose Hires, MD

## 2023-01-09 ENCOUNTER — Other Ambulatory Visit: Payer: Self-pay | Admitting: Allergy

## 2023-01-11 ENCOUNTER — Other Ambulatory Visit: Payer: Self-pay | Admitting: Family

## 2023-01-16 ENCOUNTER — Telehealth: Payer: Self-pay

## 2023-01-16 NOTE — Telephone Encounter (Signed)
Received [facility] fax - DOB verified - letter has been placed in provider's in basket.  Forwarding message to provider as update.

## 2023-01-17 ENCOUNTER — Telehealth: Payer: Self-pay

## 2023-01-17 NOTE — Telephone Encounter (Signed)
-----   Message from [employer]gett sent at 01/05/2023  1:52 PM EST ----- Talked with pt who states Leeanne Rio, NP with [facility] who works under Dr Andris Baumann I. Hoy Finlay is accepting new pts.  Please place referral to [facility] for consultation for adverse medication reactions (IV iron, Xolair and saline infusions).

## 2023-01-17 NOTE — Telephone Encounter (Deleted)
Hello Abry[place]:   My name is Geraldine Contras & I am one of the Referral Coordinators with the [facility]. Dr. Delorse Lek has requested a referral be placed to a Landis Martins, FNP on your behalf.  Here is their contact information: Database administrator at [employer] (Allergy) [facility] [address] [place], [place] [id number] [facility] (Allergy). Phone: [phone] Fax: [phone]  If you do not hear from their office in 3-5 Business days, please give their office a call to get scheduled.  Please let me know if there is anything I can do to help.  Thank You,  Lavell Islam, [place]

## 2023-01-17 NOTE — Telephone Encounter (Signed)
Per Provider:  Please let patient know that they have sent her [facility] referral to the allergy/immunology for further review.   Called patient - DOB verified - advised of provider notation above.  Patient stated she received a call from [facility] on 01/13/23 - did a telephone interview - advised of process and that she'll receive a call in a bout a week with their decision/results.  Forwarding updated message to provider.

## 2023-01-22 ENCOUNTER — Encounter: Payer: Self-pay | Admitting: Internal Medicine

## 2023-01-23 ENCOUNTER — Encounter: Payer: Self-pay | Admitting: Internal Medicine

## 2023-01-23 ENCOUNTER — Ambulatory Visit: Attending: Internal Medicine | Admitting: Internal Medicine

## 2023-01-23 VITALS — BP 123/87 | HR 72 | Ht 63.0 in | Wt 120.0 lb

## 2023-01-23 DIAGNOSIS — R002 Palpitations: Secondary | ICD-10-CM

## 2023-01-23 DIAGNOSIS — I951 Orthostatic hypotension: Secondary | ICD-10-CM

## 2023-01-23 NOTE — Progress Notes (Unsigned)
Electrophysiology TeleHealth Note      Date:  01/23/2023   ID:  [name], Theresa Cruz [birth year], MRN [id number]  Location: patient's home  Provider location: [address], [place]  Evaluation Performed: Follow-up visit  PCP:  Cyril Mourning, FNP  Cardiologist:   *** Electrophysiologist:  SK   Chief Complaint:  ***  History of Present Illness:   My location is [facility]. The Patients location is their home . Theresa Cruz is a 37 y.o. female who presents via audio conferencing for a telehealth visit today.  Since last being seen in our clinic for multiple complaints  the patient reports *** BMBx neg for systemic mastocytosis  MGM diagnosed with colon ca with mets.  [facility]>> [facility] clinic [facility]>> rejected at [facility] and [facility] appt in March for POTS  clinic  Hx of HTN and high HR Rx prn metoprolol  not taking--2/2 low BP and fear of meds Also suspect may be having focal seizures-- ambulatory EEG>> with skin reaction   The patient denies symptoms of fevers, chills, cough, or new SOB worrisome for COVID 19. ***  Past Medical History:  Diagnosis Date   Allergic conjunctivitis 05/19/2016   Allergy    Asthma    Ehlers-Danlos disease    Fibromyalgia 09/22/2018   GAD (generalized anxiety disorder) 12/08/2021   Hypermobility syndrome 09/30/2021   IDA (iron deficiency anemia) 10/04/2021   Mast cell activation (HCC)    Migraines 04/14/2018   Numbness and tingling of lower extremity 04/14/2018   Obsessive compulsive disorder 12/08/2021   Perennial and seasonal allergic rhinitis 05/19/2016   POTS (postural orthostatic tachycardia syndrome)    Seafood allergy 03/15/2011   Last Assessment & Plan: Formatting of this note might be different from the original. Counseled on avoidance. She will bring benadryl and Epipen to [place]. She has allergy alert cards written in mandarin to bring. Formatting of this note might be different  from the original. Last Assessment & Plan: Formatting of this note might be different from the original. Counseled on avoidance. She will bring    Urticaria     Past Surgical History:  Procedure Laterality Date   NO PAST SURGERIES      Current Outpatient Medications  Medication Sig Dispense Refill   buPROPion (WELLBUTRIN XL) 150 MG 24 hr tablet Take 150 mg by mouth daily.     clobetasol ointment (TEMOVATE) 0.05 % Apply 1 Application topically as needed.     EPINEPHrine 0.3 mg/0.3 mL IJ SOAJ injection Inject 0.3 mg into the muscle as needed for anaphylaxis. 2 each 1   famotidine (PEPCID) 20 MG tablet TAKE 1 TABLET(20 MG) BY MOUTH TWICE DAILY 60 tablet 5   ketorolac (TORADOL) 15 MG/ML injection Inject 15 mg into the muscle once.     levalbuterol (XOPENEX HFA) 45 MCG/ACT inhaler INHALE 2 PUFFS BY MOUTH CHEST EVERY 6 HOURS AS NEEDED FOR WHEEZING OR TIGHTNESS OR SHORTNESS OF BREATH 15 g 1   levalbuterol (XOPENEX) 1.25 MG/3ML nebulizer solution Take 1.25 mg by nebulization every 6 (six) hours as needed for wheezing or shortness of breath. 72 mL 1   levocetirizine (XYZAL) 5 MG tablet TAKE 1 TABLET(5 MG) BY MOUTH EVERY EVENING 90 tablet 1   montelukast (SINGULAIR) 10 MG tablet TAKE 1 TABLET(10 MG) BY MOUTH AT BEDTIME 30 tablet 5   Olopatadine HCl (PATADAY) 0.2 % SOLN Place 1 drop into both eyes daily as needed. 2.5 mL 5   Olopatadine-Mometasone (RYALTRIS)  811-91 MCG/ACT SUSP 2 sprays each nostril twice a day as needed for runny or stuffy nose. 29 g 5   Rimegepant Sulfate (NURTEC) 75 MG TBDP Take 1 tablet by mouth as needed.     metoprolol tartrate (LOPRESSOR) 25 MG tablet Take 0.5 tablets (12.5 mg total) by mouth 2 (two) times daily as needed (palpitations, tachycardia, hypertension). Please DO NOT take if your heart rate is <120 OR your blood pressure is below 120 on top (systolic) and/or below 70 on the bottom(diastolic) 60 tablet 0   sertraline (ZOLOFT) 25 MG tablet Take 1 tablet (25 mg total) by  mouth daily. 30 tablet 0   No current facility-administered medications for this visit.    Allergies:   Ferric carboxymaltose, Justicia adhatoda (malabar nut tree) [justicia adhatoda], Peach flavoring agent (non-screening), Shellfish-derived products, Bee pollen, Benadryl allergy [diphenhydramine hcl], Cocoa, Feraheme [ferumoxytol], Fish allergy, Gluten meal, Imitrex [sumatriptan], Milk (cow), Peanut oil, Peanut-containing drug products, Pollen extract, Topiramate, Eletriptan, Eletriptan hydrobromide, Fish oil, Rizatriptan, and Tape   ROS:  Please see the history of present illness.   All other systems are personally reviewed and negative.    Exam:    Vital Signs:  BP 123/87 (BP Location: Right Arm, Patient Position: Sitting, Cuff Size: Normal)   Pulse 72   Ht 5\' 3"  (1.6 m)   Wt 120 lb (54.4 kg)   BMI 21.26 kg/m      Labs/Other Tests and Data Reviewed:    Recent Labs: 03/01/2022: TSH 1.640 03/24/2022: ALT 12; B Natriuretic Peptide 9.9 06/14/2022: BUN 13; Creatinine, Ser 0.71; Potassium 3.3; Sodium 135 11/08/2022: Hemoglobin 13.8; Platelets 175   Wt Readings from Last 3 Encounters:  01/23/23 120 lb (54.4 kg)  01/05/23 118 lb (53.5 kg)  11/08/22 118 lb (53.5 kg)     Other studies personally reviewed: Additional studies/ records that were reviewed today include: ***  Review of the above records today demonstrates: *** Prior radiographs: ***    ASSESSMENT & PLAN:    Palpitations   Orthostatic intolerance   Question mast cell activation disorder   Recurrent anaphylaxis   Multiple allergies   Migraines   Neuro cognitive issues including OCD, ADHD, visual hallucinations,   EDS/joint hypermobility disorder   COVID-recurrent  ***    Follow-up:  ***    Current medicines are reviewed at length with the patient today.   The patient {ACTIONS; HAS/DOES NOT HAVE:19233} concerns regarding her medicines.  The following changes were made today:  {NONE  DEFAULTED:18576}  Labs/ tests ordered today include: *** No orders of the defined types were placed in this encounter.     Today, I have spent *** minutes with the patient with telehealth technology discussing the above.  Signed, Sherryl Manges, MD  01/23/2023 9:10 AM     [facility] [address] [place] [address] [phone] (office) [phone] (fax)

## 2023-03-02 ENCOUNTER — Telehealth: Admitting: Internal Medicine

## 2023-03-23 ENCOUNTER — Ambulatory Visit: Attending: Internal Medicine | Admitting: Internal Medicine

## 2023-03-23 DIAGNOSIS — R002 Palpitations: Secondary | ICD-10-CM | POA: Diagnosis not present

## 2023-03-23 NOTE — Progress Notes (Signed)
 Electrophysiology TeleHealth Note      Date:  03/23/2023   ID:  Ngan, Qualls 26-Jan-1985, MRN [id number]  Location: patient's home  Provider location: [address], [place]  Evaluation Performed: Follow-up visit  PCP:  Cyril Mourning, FNP  Cardiologist:    Electrophysiologist:  SK   Chief Complaint:     History of Present Illness:   My location is [facility]. The Patients location is their home . Theresa Cruz is a 38 y.o. female who presents via audio conferencing for a telehealth visit today.  Since last being seen in our clinic for multiple complaints  the patient reports    BMBx neg for systemic mastocytosis  MGM diagnosed with colon ca with mets.  [facility]>> rejected at [facility] and [facility] appt in March for POTS  clinic  Hx of HTN and high HR Rx prn metoprolol  not taking--2/2 low BP and fear of meds Also suspect may be having focal seizures-- ambulatory EEG>> with skin reaction  Visit TERMINATED PREMATURELY BECAUSE OF PHONE DISCONNECTION   I CALLED BACK AND NO ANSWER THAT DAY AND CALLED BACK ABOUT A WEEK LATER AND SHE CHOSE TO DEFER THE REST OF THE CALL UNTIL THE TIME FOR WHICH IT HAD BEEN RESCHEDULED     Past Medical History:  Diagnosis Date   Allergic conjunctivitis 05/19/2016   Allergy    Asthma    Ehlers-Danlos disease    Fibromyalgia 09/22/2018   GAD (generalized anxiety disorder) 12/08/2021   Hypermobility syndrome 09/30/2021   IDA (iron deficiency anemia) 10/04/2021   Mast cell activation (HCC)    Migraines 04/14/2018   Numbness and tingling of lower extremity 04/14/2018   Obsessive compulsive disorder 12/08/2021   Perennial and seasonal allergic rhinitis 05/19/2016   POTS (postural orthostatic tachycardia syndrome)    Seafood allergy 03/15/2011   Last Assessment & Plan: Formatting of this note might be different from the original. Counseled on avoidance. She will bring  benadryl and Epipen to [place]. She has allergy alert cards written in mandarin to bring. Formatting of this note might be different from the original. Last Assessment & Plan: Formatting of this note might be different from the original. Counseled on avoidance. She will bring    Urticaria     Past Surgical History:  Procedure Laterality Date   NO PAST SURGERIES      Current Outpatient Medications  Medication Sig Dispense Refill   EPINEPHrine 0.3 mg/0.3 mL IJ SOAJ injection Inject 0.3 mg into the muscle as needed for anaphylaxis. 2 each 1   levalbuterol (XOPENEX HFA) 45 MCG/ACT inhaler INHALE 2 PUFFS BY MOUTH CHEST EVERY 6 HOURS AS NEEDED FOR WHEEZING OR TIGHTNESS OR SHORTNESS OF BREATH 15 g 1   levalbuterol (XOPENEX) 1.25 MG/3ML nebulizer solution Take 1.25 mg by nebulization every 6 (six) hours as needed for wheezing or shortness of breath. 72 mL 1   Olopatadine-Mometasone (RYALTRIS) 665-25 MCG/ACT SUSP 2 sprays each nostril twice a day as needed for runny or stuffy nose. 29 g 5   buPROPion (WELLBUTRIN XL) 150 MG 24 hr tablet Take 150 mg by mouth daily. (Patient not taking: Reported on 03/23/2023)     clobetasol ointment (TEMOVATE) 0.05 % Apply 1 Application topically as needed. (Patient not taking: Reported on 03/23/2023)     famotidine (PEPCID) 20 MG tablet TAKE 1 TABLET(20 MG) BY MOUTH TWICE DAILY (Patient not taking: Reported on 03/23/2023) 60 tablet 5  ketorolac (TORADOL) 15 MG/ML injection Inject 15 mg into the muscle once. (Patient not taking: Reported on 03/23/2023)     levocetirizine (XYZAL) 5 MG tablet TAKE 1 TABLET(5 MG) BY MOUTH EVERY EVENING (Patient not taking: Reported on 03/23/2023) 90 tablet 1   metoprolol tartrate (LOPRESSOR) 25 MG tablet Take 0.5 tablets (12.5 mg total) by mouth 2 (two) times daily as needed (palpitations, tachycardia, hypertension). Please DO NOT take if your heart rate is <120 OR your blood pressure is below 120 on top (systolic) and/or below 70 on the  bottom(diastolic) (Patient not taking: Reported on 03/23/2023) 60 tablet 0   montelukast (SINGULAIR) 10 MG tablet TAKE 1 TABLET(10 MG) BY MOUTH AT BEDTIME (Patient not taking: Reported on 03/23/2023) 30 tablet 5   Olopatadine HCl (PATADAY) 0.2 % SOLN Place 1 drop into both eyes daily as needed. (Patient not taking: Reported on 03/23/2023) 2.5 mL 5   Rimegepant Sulfate (NURTEC) 75 MG TBDP Take 1 tablet by mouth as needed. (Patient not taking: Reported on 03/23/2023)     sertraline (ZOLOFT) 25 MG tablet Take 1 tablet (25 mg total) by mouth daily. (Patient not taking: Reported on 03/23/2023) 30 tablet 0   No current facility-administered medications for this visit.    Allergies:   Ferric carboxymaltose, Justicia adhatoda (malabar nut tree) [justicia adhatoda], Peach flavoring agent (non-screening), Shellfish-derived products, Bee pollen, Benadryl allergy [diphenhydramine hcl], Cocoa, Feraheme [ferumoxytol], Fish allergy, Gluten meal, Imitrex [sumatriptan], Milk (cow), Peanut oil, Peanut-containing drug products, Pollen extract, Topiramate, Eletriptan, Eletriptan hydrobromide, Fish oil, Rizatriptan, and Tape   ROS:  Please see the history of present illness.   All other systems are personally reviewed and negative.    Exam:    Vital Signs:  Ht 5\' 3"  (1.6 m)   Wt 115 lb (52.2 kg)   BMI 20.37 kg/m      Labs/Other Tests and Data Reviewed:    Recent Labs: 03/24/2022: ALT 12; B Natriuretic Peptide 9.9 06/14/2022: BUN 13; Creatinine, Ser 0.71; Potassium 3.3; Sodium 135 11/08/2022: Hemoglobin 13.8; Platelets 175   Wt Readings from Last 3 Encounters:  03/23/23 115 lb (52.2 kg)  01/23/23 120 lb (54.4 kg)  01/05/23 118 lb (53.5 kg)     Other studies personally reviewed: Additional studies/ records that were reviewed today include:  (As above)    ASSESSMENT & PLAN:    Palpitations   Orthostatic intolerance   Question mast cell activation disorder   Recurrent anaphylaxis   Multiple  allergies   Migraines   Neuro cognitive issues including OCD, ADHD, visual hallucinations,   EDS/joint hypermobility disorder   COVID-recurrent       Follow-up:  (As above)      Today, I have spent 13 minutes with the patient with telehealth technology discussing the above.  Signed, Sherryl Manges, MD  03/23/2023 1:14 PM     [facility] [address] [place] [address] [phone] (office) [phone] (fax)

## 2023-04-06 ENCOUNTER — Other Ambulatory Visit: Payer: Self-pay | Admitting: Obstetrics and Gynecology

## 2023-04-06 DIAGNOSIS — Z803 Family history of malignant neoplasm of breast: Secondary | ICD-10-CM

## 2023-04-10 ENCOUNTER — Encounter: Payer: Self-pay | Admitting: Family

## 2023-04-11 ENCOUNTER — Inpatient Hospital Stay

## 2023-04-11 ENCOUNTER — Inpatient Hospital Stay: Admitting: Family

## 2023-04-17 ENCOUNTER — Telehealth: Payer: Self-pay | Admitting: Allergy

## 2023-04-17 NOTE — Telephone Encounter (Signed)
 Pt request a sample of ryaltris

## 2023-04-18 ENCOUNTER — Other Ambulatory Visit: Payer: Self-pay

## 2023-04-18 ENCOUNTER — Other Ambulatory Visit: Payer: Self-pay | Admitting: Allergy

## 2023-04-18 ENCOUNTER — Telehealth: Payer: Self-pay

## 2023-04-18 MED ORDER — RYALTRIS 665-25 MCG/ACT NA SUSP: NASAL | 5 refills | Status: DC

## 2023-04-18 NOTE — Telephone Encounter (Signed)
 Called pt---DOB Verified---let her know that we did not have any samples in the office but [facility] and they will reach out to her with further details. Verbalized understanding. No further questions or concerns.

## 2023-05-03 ENCOUNTER — Encounter: Payer: Self-pay | Admitting: Family Medicine

## 2023-05-05 ENCOUNTER — Ambulatory Visit: Admitting: Family Medicine

## 2023-05-10 ENCOUNTER — Encounter: Payer: Self-pay | Admitting: Obstetrics and Gynecology

## 2023-05-11 ENCOUNTER — Inpatient Hospital Stay: Admission: RE | Admit: 2023-05-11 | Source: Ambulatory Visit

## 2023-05-15 ENCOUNTER — Inpatient Hospital Stay: Admitting: Family

## 2023-05-15 ENCOUNTER — Inpatient Hospital Stay: Attending: Hematology & Oncology

## 2023-05-15 IMAGING — CT CT HEAD W/O CM
4 series · 16 of 47 positions shown, 18 images · non-contrast
Comparison: None Available.

CLINICAL DATA: Headache, chronic, new features or increased
frequency



[Series 2: head wo · axial · 0.41mm/px · z∈[+1013,+1128]mm · 7 of 31 slices shown, 9 images]
[im 4/31  brain]
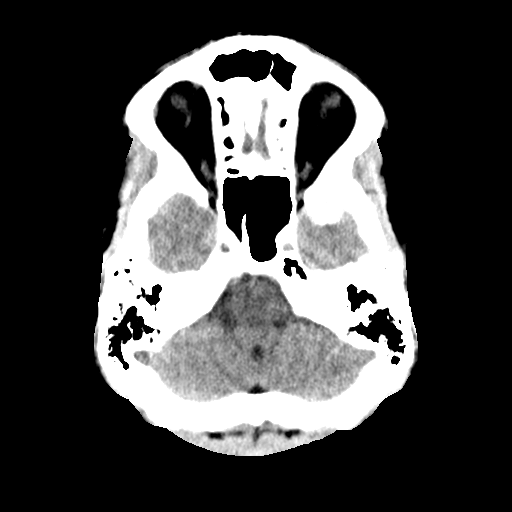
[im 4/31  bone]
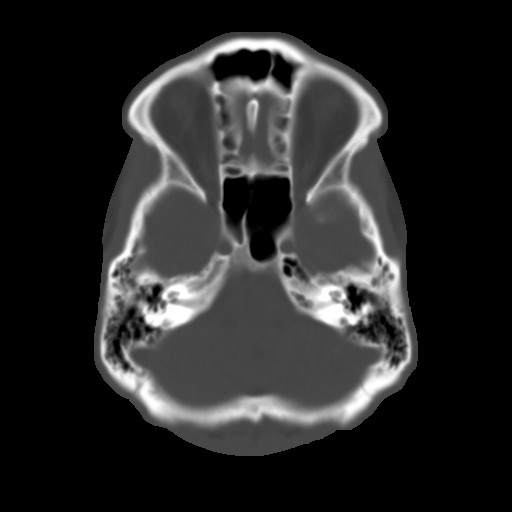
[im 8/31  brain]
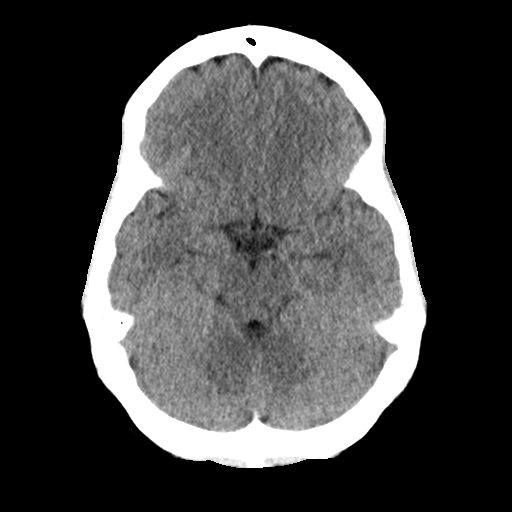
[im 12/31  brain]
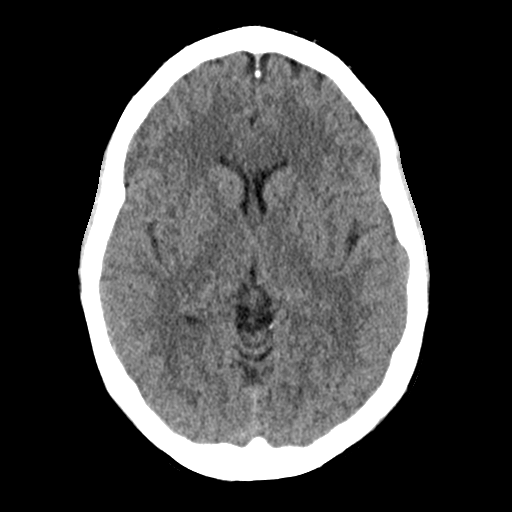
[im 16/31  brain]
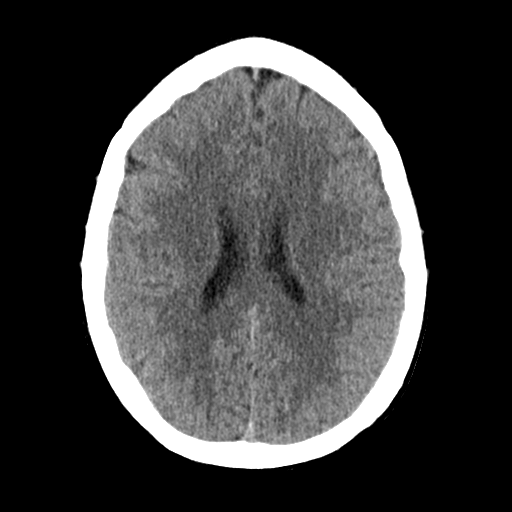
[im 19/31  brain]
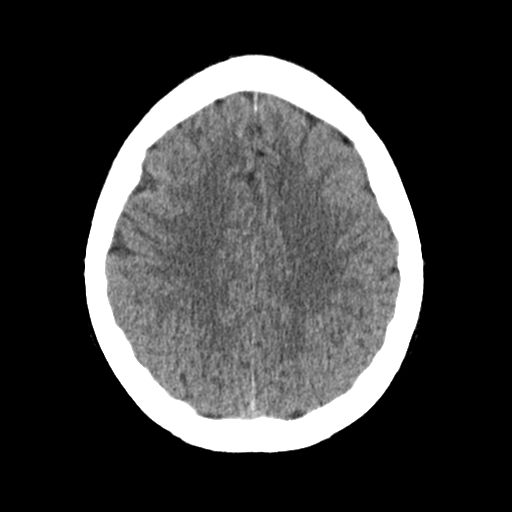
[im 19/31  bone]
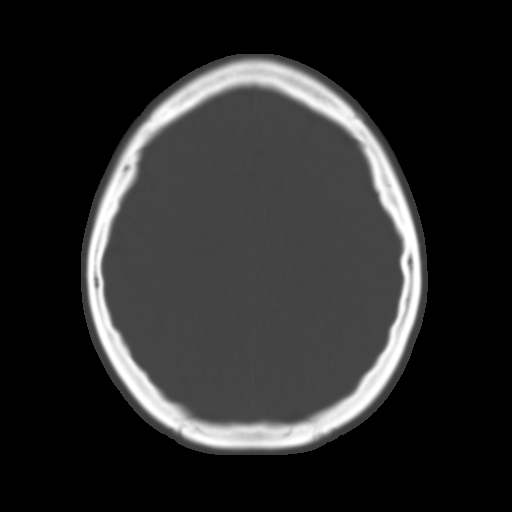
[im 23/31  brain]
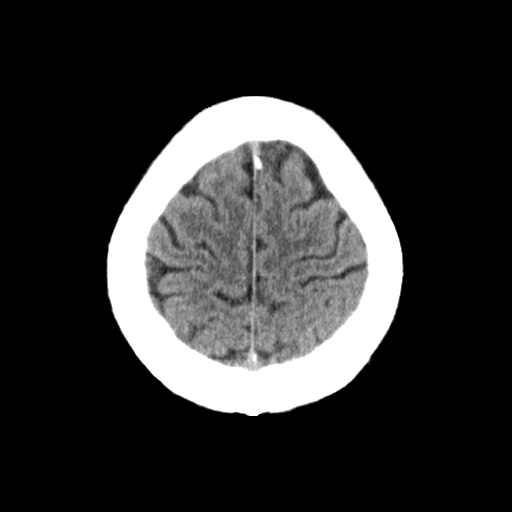
[im 27/31  brain]
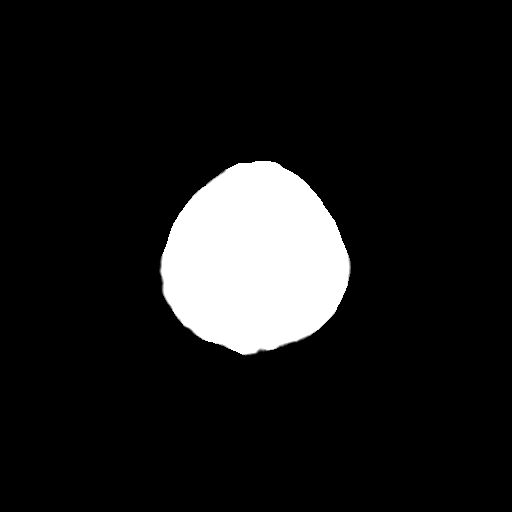

[Series 3: head bone · axial · 0.41mm/px · z∈[+1012,+1042]mm · 3 of 76 slices shown]
[im 8/76  bone]
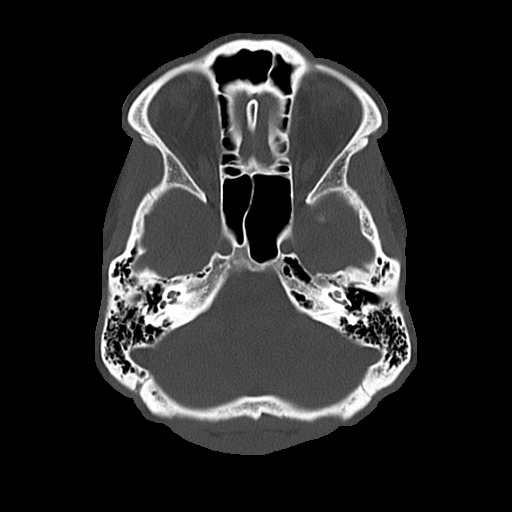
[im 16/76  bone]
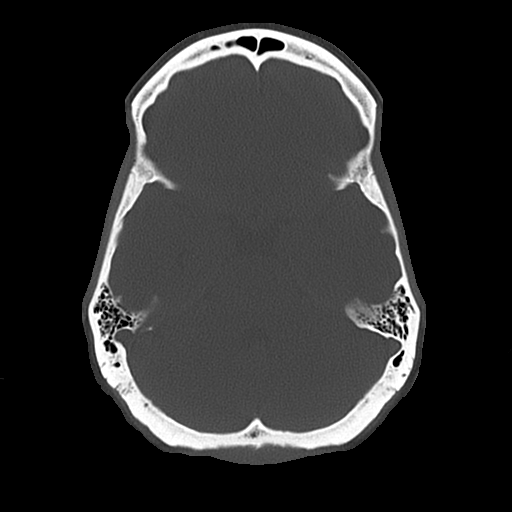
[im 23/76  bone]
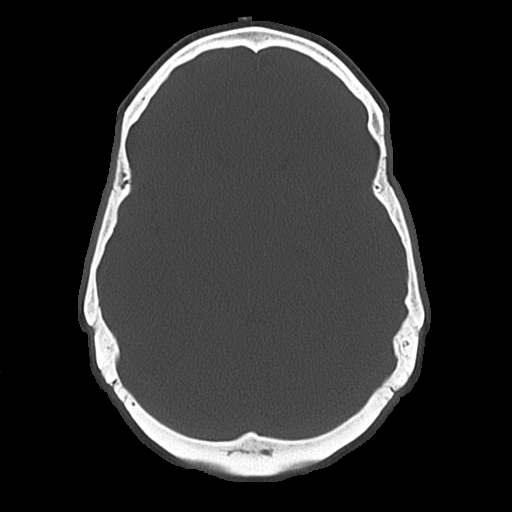

[Series 4: coronal soft · coronal · 0.29mm/px · 3 of 69 slices shown]
[im 23/69  brain]
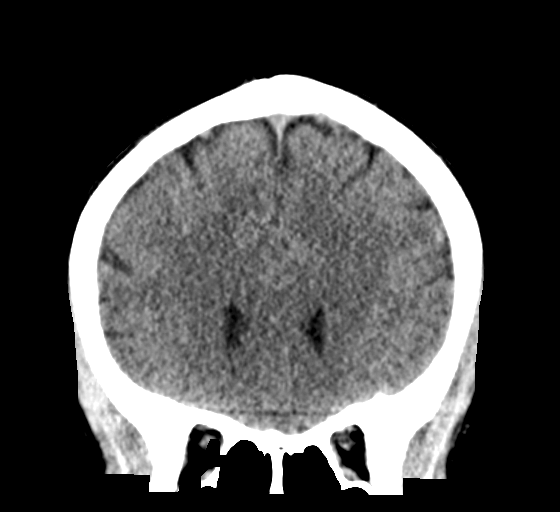
[im 31/69  brain]
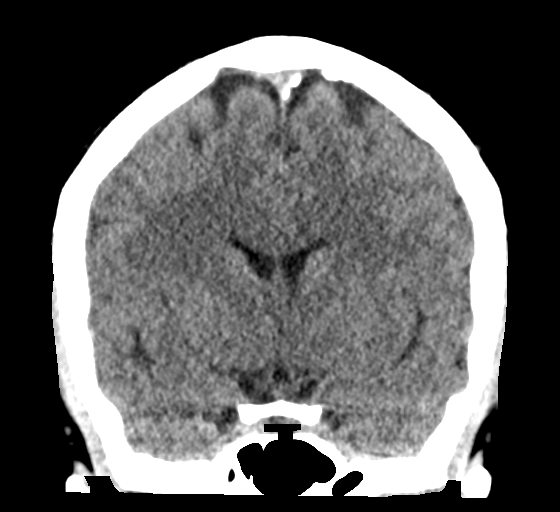
[im 38/69  brain]
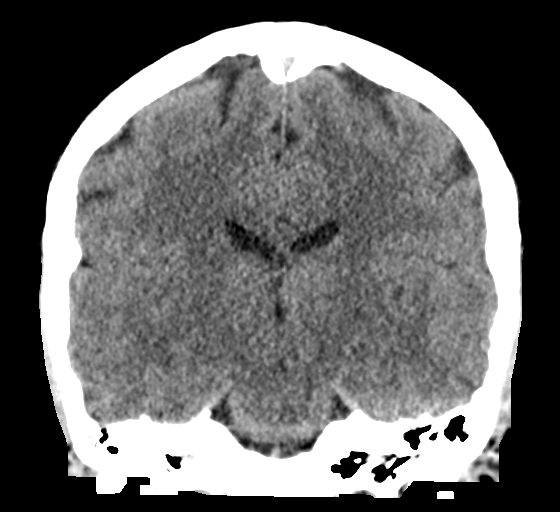

[Series 5: sagittal soft · sagittal · 0.29mm/px · 3 of 55 slices shown]
[im 19/55  brain]
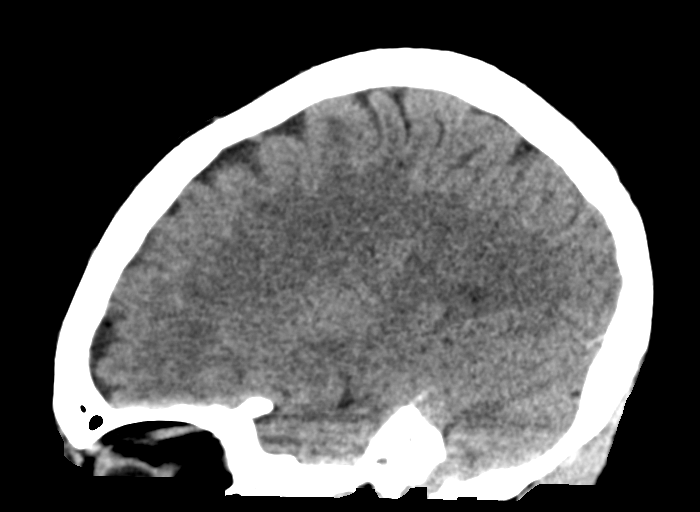
[im 28/55  brain]
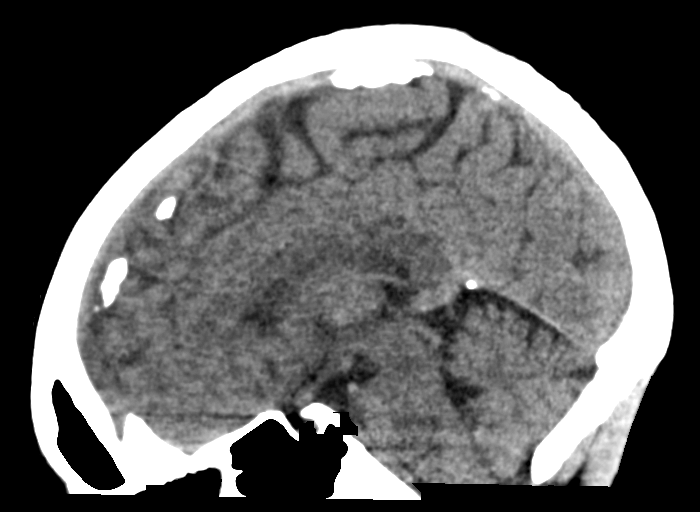
[im 37/55  brain]
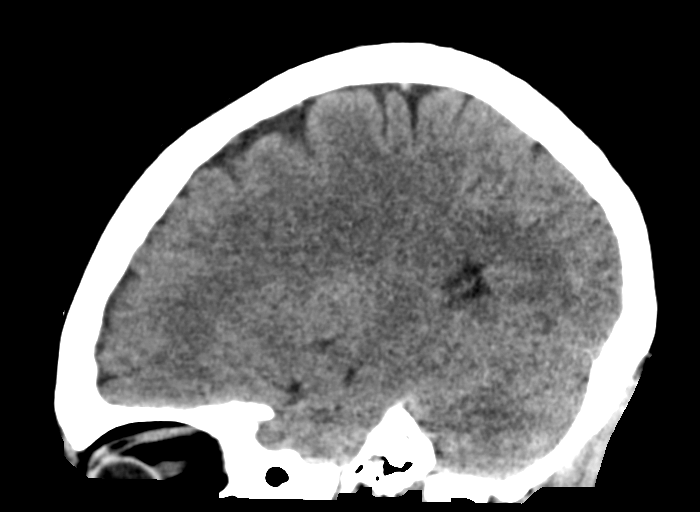

[16 of 47 positions shown; findings below may reference images not displayed]

FINDINGS: Brain: No acute intracranial abnormality. Specifically, no
hemorrhage, hydrocephalus, mass lesion, acute infarction, or
significant intracranial injury.

Vascular: No hyperdense vessel or unexpected calcification.

Skull: No acute calvarial abnormality.

Sinuses/Orbits: No acute findings

Other: None
IMPRESSION: Normal study.

## 2023-06-21 ENCOUNTER — Other Ambulatory Visit (HOSPITAL_COMMUNITY): Payer: Self-pay | Admitting: Obstetrics and Gynecology

## 2023-06-21 ENCOUNTER — Ambulatory Visit
Admission: RE | Admit: 2023-06-21 | Discharge: 2023-06-21 | Disposition: A | Discharge status: Home / Self Care | Source: Ambulatory Visit | Attending: Obstetrics and Gynecology | Admitting: Obstetrics and Gynecology

## 2023-06-21 DIAGNOSIS — Z803 Family history of malignant neoplasm of breast: Secondary | ICD-10-CM

## 2023-06-29 ENCOUNTER — Ambulatory Visit (HOSPITAL_COMMUNITY)

## 2023-07-01 ENCOUNTER — Ambulatory Visit (HOSPITAL_COMMUNITY)
Admission: RE | Admit: 2023-07-01 | Discharge: 2023-07-01 | Disposition: A | Discharge status: Home / Self Care | Source: Ambulatory Visit | Attending: Obstetrics and Gynecology | Admitting: Obstetrics and Gynecology

## 2023-07-01 DIAGNOSIS — Z1239 Encounter for other screening for malignant neoplasm of breast: Secondary | ICD-10-CM | POA: Diagnosis present

## 2023-07-01 DIAGNOSIS — Z803 Family history of malignant neoplasm of breast: Secondary | ICD-10-CM | POA: Insufficient documentation

## 2023-07-01 MED ORDER — GADOBUTROL 1 MMOL/ML IV SOLN
5.2000 mL | Freq: Once | INTRAVENOUS | Status: AC | PRN
  Administered 2023-07-01: 5.2 mL via INTRAVENOUS

## 2023-07-31 ENCOUNTER — Other Ambulatory Visit: Payer: Self-pay | Admitting: Allergy

## 2023-10-17 ENCOUNTER — Encounter: Payer: Self-pay | Admitting: Allergy

## 2023-10-31 ENCOUNTER — Ambulatory Visit: Admitting: Allergy & Immunology

## 2023-11-01 ENCOUNTER — Other Ambulatory Visit: Payer: Self-pay

## 2023-11-01 ENCOUNTER — Encounter: Payer: Self-pay | Admitting: Allergy

## 2023-11-01 ENCOUNTER — Ambulatory Visit: Admitting: Allergy

## 2023-11-01 VITALS — BP 104/70 | HR 77 | Temp 97.9°F | Resp 16 | Ht 62.5 in | Wt 119.3 lb

## 2023-11-01 DIAGNOSIS — T50905D Adverse effect of unspecified drugs, medicaments and biological substances, subsequent encounter: Secondary | ICD-10-CM

## 2023-11-01 DIAGNOSIS — J3089 Other allergic rhinitis: Secondary | ICD-10-CM | POA: Diagnosis not present

## 2023-11-01 DIAGNOSIS — T7840XD Allergy, unspecified, subsequent encounter: Secondary | ICD-10-CM | POA: Diagnosis not present

## 2023-11-01 DIAGNOSIS — T7800XD Anaphylactic reaction due to unspecified food, subsequent encounter: Secondary | ICD-10-CM | POA: Diagnosis not present

## 2023-11-01 DIAGNOSIS — T7819XD Other adverse food reactions, not elsewhere classified, subsequent encounter: Secondary | ICD-10-CM

## 2023-11-01 DIAGNOSIS — H1013 Acute atopic conjunctivitis, bilateral: Secondary | ICD-10-CM

## 2023-11-01 DIAGNOSIS — Z888 Allergy status to other drugs, medicaments and biological substances status: Secondary | ICD-10-CM

## 2023-11-01 DIAGNOSIS — G43109 Migraine with aura, not intractable, without status migrainosus: Secondary | ICD-10-CM

## 2023-11-01 DIAGNOSIS — L509 Urticaria, unspecified: Secondary | ICD-10-CM

## 2023-11-01 DIAGNOSIS — J4540 Moderate persistent asthma, uncomplicated: Secondary | ICD-10-CM

## 2023-11-01 MED ORDER — LEVALBUTEROL TARTRATE 45 MCG/ACT IN AERO: INHALATION_SPRAY | RESPIRATORY_TRACT | 1 refills | Status: AC

## 2023-11-01 MED ORDER — EPINEPHRINE 0.3 MG/0.3ML IJ SOAJ: 0.3000 mg | INTRAMUSCULAR | 1 refills | Status: AC | PRN

## 2023-11-01 MED ORDER — RYALTRIS 665-25 MCG/ACT NA SUSP: NASAL | 5 refills | Status: AC

## 2023-11-01 NOTE — Patient Instructions (Addendum)
 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  Tryptase obtained during episodes has also been normal. Urine histamine studies have been normal. Urine leukotriene studies are elevated --> unclear as to elevation.  Trialed singulair  (anti-leukotriene medication) but not able to tolerate and discontinue.  - To simplify current medication regimen as much as possible: Levocetrizine 1-2 times a day (max dosing is 4 tabs/day) Holding on Singulair , Cromolyn  and Pepcid  use Have access to epinephrine  device in case of reaction.  Follow emergency action plan in case of allergic reaction - Xolair  375mg  injection tried however developed adverse symptoms and treated with epinephrine .  - Continue your current food avoidance.  Discussed option of getting serum IgE levels for corn, onion and garlic.  These have not been tested in the past. - Food allergy  testing has been positive to sesame, milk, pistachio, hops, pork, navy bean, Karaya gum, hazelnut, pecan, cashew, walnut, peach, kiwi, pineapple, banana with previous testing.  All of these levels were very low except for the cashew.  - I do believe you have a component of oral allergy  syndrome with fresh or raw fruits related to your pollen allergy  (see below) - There is an irritant effect (like strong odors or noxious odors) can irritate the airway leading to symptoms there could be a role here of airborne food particles that may be inhaled irritating the airway - She has had symptoms including angioedema now 3 times after receiving IV saline.  Would recommend LR if needed fluid  --> Will place referral and  send medical records to the [facility] EDS clinic --> Will place dietitian referral to Raquel Durban to see if she can provide any assistance with appropriate foods and nutritional needs --> Recommended to look into [facility] with Zac Spiritos, MD who is gastroenterology by training but now has a private practice in regards to GI driven MCAS/EDS/POTS --> Recommended to look into potential medication, Avapritinib (Ayvakit), which is the medication approved for systemic mastocytosis.  There may be a possibility of getting this medication off label despite having negative testing for mastocytosis.  Asthma - Have access to Xopenex  inhaler 2 puffs or Xopenex  1 vial via nebulizer every 4-6 hours as needed for cough/wheeze/shortness of breath/chest tightness.  May use 15-20 minutes prior to activity.   Monitor frequency of use.    Allergic rhinitis with conjunctivitis - Continue avoidance measures for dust mite, dog dander, grass pollen, tree pollen, weed pollen, ragweed pollen, and mold.  - Levocetirizine as above - Ryaltris  1 to 2 sprays each nostril 1-2 times a day as needed for runny or stuffy nose  Urticaria (hives) - Levocetirizine as above  Migraines, visual field changes, Memory issues --> ?focal seizures - Brain MRI performed was normal - Following with neurology and has abortive therapy in place for migraine

## 2023-11-01 NOTE — Progress Notes (Signed)
 Follow-up Note  RE: Theresa Cruz MRN: [id number] DOB: 1985-10-15 Date of Office Visit: 11/01/2023   History of present illness: Theresa Cruz is a 38 y.o. female presenting today for follow-up of allergic disease (MCAS, food allergy , allergic rhinoconjunctivitis, oral allergy  syndrome, asthma, urticaria).   Her last visit was a telemedicine visit on 01/05/2023 by myself.  Over the past month or two, she has experienced a worsening of food sensitivities, identifying new triggers such as corn, grits, onions, garlic. Symptoms include headaches, slight migraine-like feelings, and nausea shortly after exposure. The smell of raw onions is particularly triggering, causing her to feel 'super sick' and leading to headaches. Despite previous allergy  testing in 2021, which identified some allergies, she continues to react to foods that tested negative, and her symptoms have progressively worsened.  In addition to food sensitivities, she reports significant reactions to environmental triggers such as the smell of coffee, perfumes, colognes, and other scented products. These exposures lead to symptoms like migraines, nausea, and a general feeling of discomfort. She describes her sense of smell as 'very heightened,' and reports that she can smell things that other people may not notice. She avoids situations where she might be exposed to triggering scents, such as coffee shops and confined spaces with strong odors.  Her primary issues include gastrointestinal symptoms and migraine pain, along with hives, flushing, and throat itchiness. She can differentiate between normal throat itchiness and more concerning symptoms at this time. Her current medications include Xyzal , which she takes as needed, and she has an EpiPen , although it may need to be refilled. She also uses Xopenex  as needed for respiratory symptoms. She has previously used hydroxyzine for hives and itchiness but finds it sedating and it  usually lasts longer than most.  She has not had good tolerability in regards to Singulair  and cromolyn  use.  We have also tried Pepcid  in the past.  We also tried Xolair  which she had a reaction to.  She has had reactions to other medications and medication like products including IV saline bolus in the ED.  She she states she is also latex allergic and is cautious about potential reactions to other substances. She suspects nutritional deficiencies due to a limited diet and potential malabsorption issues.  She has not had a dietary consultation to help with foods that she may be able to incorporate into her diet to help with nutritional deficiencies.  She is looking to get in with the EDS clinic at [facility] with Dr Mylo who is an mast cell expert.    She has had normal tryptase levels as well as a negative c-kit mutation and bone marrow biopsy.  Of her lab work that has been positive she has had an elevated urinary leukotriene.  Review of systems: 10pt ROS negative unless noted above in HPI  Past medical/social/surgical/family history have been reviewed and are unchanged unless specifically indicated below.  No changes  Medication List: Current Outpatient Medications  Medication Sig Dispense Refill   EPINEPHrine  0.3 mg/0.3 mL IJ SOAJ injection Inject 0.3 mg into the muscle as needed for anaphylaxis. 2 each 1   levalbuterol  (XOPENEX  HFA) 45 MCG/ACT inhaler INHALE 2 PUFFS BY MOUTH CHEST EVERY 6 HOURS AS NEEDED FOR WHEEZING OR TIGHTNESS OR SHORTNESS OF BREATH 15 g 1   levalbuterol  (XOPENEX ) 1.25 MG/3ML nebulizer solution Take 1.25 mg by nebulization every 6 (six) hours as needed for wheezing or shortness of breath. 72 mL 1   levocetirizine (XYZAL ) 5 MG tablet TAKE  1 TABLET(5 MG) BY MOUTH EVERY EVENING 90 tablet 1   Olopatadine -Mometasone (RYALTRIS ) 665-25 MCG/ACT SUSP 2 sprays each nostril twice a day as needed for runny or stuffy nose. 29 g 5   buPROPion  (WELLBUTRIN  XL) 150 MG 24 hr tablet Take  150 mg by mouth daily. (Patient not taking: Reported on 03/23/2023)     clobetasol ointment (TEMOVATE) 0.05 % Apply 1 Application topically as needed. (Patient not taking: Reported on 03/23/2023)     famotidine  (PEPCID ) 20 MG tablet TAKE 1 TABLET(20 MG) BY MOUTH TWICE DAILY (Patient not taking: Reported on 03/23/2023) 60 tablet 5   ketorolac  (TORADOL ) 15 MG/ML injection Inject 15 mg into the muscle once. (Patient not taking: Reported on 03/23/2023)     metoprolol  tartrate (LOPRESSOR ) 25 MG tablet Take 0.5 tablets (12.5 mg total) by mouth 2 (two) times daily as needed (palpitations, tachycardia, hypertension). Please DO NOT take if your heart rate is <120 OR your blood pressure is below 120 on top (systolic) and/or below 70 on the bottom(diastolic) (Patient not taking: Reported on 03/23/2023) 60 tablet 0   montelukast  (SINGULAIR ) 10 MG tablet TAKE 1 TABLET(10 MG) BY MOUTH AT BEDTIME (Patient not taking: Reported on 03/23/2023) 30 tablet 5   Olopatadine  HCl (PATADAY ) 0.2 % SOLN Place 1 drop into both eyes daily as needed. (Patient not taking: Reported on 03/23/2023) 2.5 mL 5   Rimegepant Sulfate (NURTEC) 75 MG TBDP Take 1 tablet by mouth as needed. (Patient not taking: Reported on 03/23/2023)     sertraline  (ZOLOFT ) 25 MG tablet Take 1 tablet (25 mg total) by mouth daily. (Patient not taking: Reported on 03/23/2023) 30 tablet 0   No current facility-administered medications for this visit.     Known medication allergies: Allergies  Allergen Reactions   Ferric Carboxymaltose Anaphylaxis, Shortness Of Breath and Nausea And Vomiting   Justicia Adhatoda (Malabar Nut Tree) [Justicia Adhatoda] Anaphylaxis   Peach Flavoring Agent (Non-Screening) Hives and Shortness Of Breath   Shellfish-Derived Products Anaphylaxis   Bee Pollen Cough   Benadryl  Allergy  [Diphenhydramine  Hcl] Nausea And Vomiting   Cocoa Other (See Comments)    Migraine   Feraheme [Ferumoxytol ] Diarrhea    N/V/D, abdominal pain and chest  discomfort.    Fish Allergy  Hives and Itching   Gluten Meal Nausea And Vomiting and Other (See Comments)    Migraine   Imitrex [Sumatriptan] Other (See Comments)    Muscle tension   Milk (Cow) Nausea And Vomiting   Peanut Oil Hives and Itching   Peanut-Containing Drug Products Hives   Pollen Extract Itching and Cough   Topiramate Other (See Comments)    Other Reaction(s): Confusion or altered mental state  Other Reaction(s): Delusions (intolerance)    Muscles tightening   Eletriptan Other (See Comments)   Eletriptan Hydrobromide Other (See Comments)   Fish Oil Itching   Rizatriptan Other (See Comments)    Muscles tightening   Tape Rash     Physical examination: Blood pressure 104/70, pulse 77, temperature 97.9 F (36.6 C), resp. rate 16, height 5' 2.5 (1.588 m), weight 119 lb 4.8 oz (54.1 kg), SpO2 99%.  General: Alert, interactive, in no acute distress. HEENT: PERRLA, TMs pearly gray, turbinates mildly edematous without discharge, post-pharynx non erythematous. Neck: Supple without lymphadenopathy. Lungs: Clear to auscultation without wheezing, rhonchi or rales. {no increased work of breathing. CV: Normal S1, S2 without murmurs. Abdomen: Nondistended, nontender. Skin: Warm and dry, without lesions or rashes. Extremities:  No clubbing, cyanosis or edema. Neuro:  Grossly intact.  Diagnostics/Labs: None today  Assessment and plan: Allergic reactions H/o allergy  to multiple medications Food allergy  Oral allergy  syndrome  - Diagnosed with hypermobility syndrome/Ehlers-Danlos syndrome - You do have IgE mediated allergy  issues however also experience reaction type symptoms despite any known triggers -->?Idiopathic anaphylaxis vs MCAS - Tryptase, Ckit mutation testing for mastocytosis as well as bone marrow biopsy has been normal.  Tryptase obtained during episodes has also been normal. Urine histamine studies have been normal. Urine leukotriene studies are elevated -->  unclear as to elevation.  Trialed singulair  (anti-leukotriene medication) but not able to tolerate and discontinue.  - To simplify current medication regimen as much as possible: Levocetrizine 1-2 times a day (max dosing is 4 tabs/day) Holding on Singulair , Cromolyn  and Pepcid  use Have access to epinephrine  device in case of reaction.  Follow emergency action plan in case of allergic reaction - Xolair  375mg  injection tried however developed adverse symptoms and treated with epinephrine .  - Continue your current food avoidance.  Discussed option of getting serum IgE levels for corn, onion and garlic.  These have not been tested in the past. - Food allergy  testing has been positive to sesame, milk, pistachio, hops, pork, navy bean, Karaya gum, hazelnut, pecan, cashew, walnut, peach, kiwi, pineapple, banana with previous testing.  All of these levels were very low except for the cashew.  - I do believe you have a component of oral allergy  syndrome with fresh or raw fruits related to your pollen allergy  (see below) - There is an irritant effect (like strong odors or noxious odors) can irritate the airway leading to symptoms there could be a role here of airborne food particles that may be inhaled irritating the airway - She has had symptoms including angioedema now 3 times after receiving IV saline.  Would recommend LR if needed fluid  --> Will place referral and send medical records to the [facility] EDS clinic --> Will place dietitian referral to Raquel Durban to see if she can provide any assistance with appropriate foods and nutritional needs --> Recommended to look into [facility] with Zac Spiritos, MD who is gastroenterology by training but now has a private practice in regards to GI driven MCAS/EDS/POTS --> Recommended to look into potential medication, Avapritinib (Ayvakit), which is the medication approved for systemic mastocytosis.  There may be a possibility of getting this medication off  label despite having negative testing for mastocytosis.  Asthma - Have access to Xopenex  inhaler 2 puffs or Xopenex  1 vial via nebulizer every 4-6 hours as needed for cough/wheeze/shortness of breath/chest tightness.  May use 15-20 minutes prior to activity.   Monitor frequency of use.    Allergic rhinitis with conjunctivitis - Continue avoidance measures for dust mite, dog dander, grass pollen, tree pollen, weed pollen, ragweed pollen, and mold.  - Levocetirizine as above - Ryaltris  1 to 2 sprays each nostril 1-2 times a day as needed for runny or stuffy nose  Urticaria (hives) - Levocetirizine as above  Migraines, visual field changes, Memory issues --> ?focal seizures - Brain MRI performed was normal - Following with neurology and has abortive therapy in place for migraine  I personally spent a total of 60 minutes in the care of the patient today including preparing to see the patient, getting/reviewing separately obtained history, performing a medically appropriate exam/evaluation, counseling and educating, referring and communicating with other health care professionals, documenting clinical information in the EHR, and coordinating care.  I appreciate the opportunity to take part in  Chaundra's care. Please do not hesitate to contact me with questions.  Sincerely,   Danita Brain, MD Allergy /Immunology [facility]

## 2023-11-06 ENCOUNTER — Telehealth: Payer: Self-pay | Admitting: Allergy

## 2023-11-06 NOTE — Telephone Encounter (Signed)
 Wadie has been referred to Raquel Durban, dietician.  The referral and notes have been emailed with Dr. Jeneal cc'd.

## 2023-11-13 NOTE — Telephone Encounter (Signed)
 Neyla declined referral from Raquel Durban, per Raquel's email to Dr. Jeneal and I.
